# Patient Record
Sex: Female | Born: 1950 | Race: White | Hispanic: No | Smoking: Former smoker
Health system: Southern US, Community
[De-identification: ages and names within clinical notes are randomized; demographics above are authoritative.]

## PROBLEM LIST (undated history)

## (undated) DIAGNOSIS — M1712 Unilateral primary osteoarthritis, left knee: Secondary | ICD-10-CM

## (undated) DIAGNOSIS — K589 Irritable bowel syndrome without diarrhea: Secondary | ICD-10-CM

## (undated) DIAGNOSIS — J069 Acute upper respiratory infection, unspecified: Secondary | ICD-10-CM

## (undated) DIAGNOSIS — J189 Pneumonia, unspecified organism: Secondary | ICD-10-CM

## (undated) DIAGNOSIS — E785 Hyperlipidemia, unspecified: Secondary | ICD-10-CM

## (undated) DIAGNOSIS — K5792 Diverticulitis of intestine, part unspecified, without perforation or abscess without bleeding: Secondary | ICD-10-CM

## (undated) DIAGNOSIS — M810 Age-related osteoporosis without current pathological fracture: Secondary | ICD-10-CM

## (undated) DIAGNOSIS — J40 Bronchitis, not specified as acute or chronic: Secondary | ICD-10-CM

## (undated) DIAGNOSIS — K219 Gastro-esophageal reflux disease without esophagitis: Secondary | ICD-10-CM

## (undated) DIAGNOSIS — F17200 Nicotine dependence, unspecified, uncomplicated: Secondary | ICD-10-CM

## (undated) DIAGNOSIS — R0602 Shortness of breath: Secondary | ICD-10-CM

## (undated) DIAGNOSIS — K449 Diaphragmatic hernia without obstruction or gangrene: Secondary | ICD-10-CM

## (undated) DIAGNOSIS — I209 Angina pectoris, unspecified: Secondary | ICD-10-CM

## (undated) DIAGNOSIS — R059 Cough, unspecified: Secondary | ICD-10-CM

## (undated) DIAGNOSIS — R05 Cough: Secondary | ICD-10-CM

## (undated) DIAGNOSIS — M1990 Unspecified osteoarthritis, unspecified site: Secondary | ICD-10-CM

## (undated) DIAGNOSIS — F32A Depression, unspecified: Secondary | ICD-10-CM

## (undated) DIAGNOSIS — I480 Paroxysmal atrial fibrillation: Secondary | ICD-10-CM

## (undated) DIAGNOSIS — F419 Anxiety disorder, unspecified: Secondary | ICD-10-CM

## (undated) DIAGNOSIS — I10 Essential (primary) hypertension: Secondary | ICD-10-CM

## (undated) DIAGNOSIS — F329 Major depressive disorder, single episode, unspecified: Secondary | ICD-10-CM

## (undated) DIAGNOSIS — T7840XA Allergy, unspecified, initial encounter: Secondary | ICD-10-CM

## (undated) HISTORY — DX: Paroxysmal atrial fibrillation: I48.0

## (undated) HISTORY — DX: Age-related osteoporosis without current pathological fracture: M81.0

## (undated) HISTORY — PX: UPPER GI ENDOSCOPY: SHX6162

## (undated) HISTORY — PX: TONSILLECTOMY: SUR1361

## (undated) HISTORY — DX: Diverticulitis of intestine, part unspecified, without perforation or abscess without bleeding: K57.92

## (undated) HISTORY — DX: Major depressive disorder, single episode, unspecified: F32.9

## (undated) HISTORY — DX: Anxiety disorder, unspecified: F41.9

## (undated) HISTORY — DX: Depression, unspecified: F32.A

## (undated) HISTORY — PX: TUBAL LIGATION: SHX77

## (undated) HISTORY — PX: WISDOM TOOTH EXTRACTION: SHX21

## (undated) HISTORY — PX: CHOLECYSTECTOMY: SHX55

## (undated) HISTORY — PX: KNEE ARTHROSCOPY: SHX127

## (undated) HISTORY — DX: Allergy, unspecified, initial encounter: T78.40XA

## (undated) HISTORY — PX: DILATION AND CURETTAGE OF UTERUS: SHX78

## (undated) HISTORY — DX: Gastro-esophageal reflux disease without esophagitis: K21.9

---

## 2000-12-27 ENCOUNTER — Other Ambulatory Visit: Admission: RE | Admit: 2000-12-27 | Discharge: 2000-12-27 | Payer: Self-pay | Admitting: *Deleted

## 2001-01-21 ENCOUNTER — Encounter (INDEPENDENT_AMBULATORY_CARE_PROVIDER_SITE_OTHER): Payer: Self-pay | Admitting: Specialist

## 2001-01-21 ENCOUNTER — Other Ambulatory Visit: Admission: RE | Admit: 2001-01-21 | Discharge: 2001-01-21 | Payer: Self-pay | Admitting: *Deleted

## 2001-03-30 ENCOUNTER — Inpatient Hospital Stay (HOSPITAL_COMMUNITY): Admission: EM | Admit: 2001-03-30 | Discharge: 2001-03-31 | Payer: Self-pay | Admitting: Emergency Medicine

## 2001-03-30 ENCOUNTER — Encounter: Payer: Self-pay | Admitting: Emergency Medicine

## 2001-04-04 ENCOUNTER — Encounter: Admission: RE | Admit: 2001-04-04 | Discharge: 2001-04-04 | Payer: Self-pay | Admitting: Internal Medicine

## 2001-06-27 ENCOUNTER — Encounter: Admission: RE | Admit: 2001-06-27 | Discharge: 2001-06-27 | Payer: Self-pay

## 2001-07-18 ENCOUNTER — Encounter: Admission: RE | Admit: 2001-07-18 | Discharge: 2001-07-18 | Payer: Self-pay | Admitting: Internal Medicine

## 2002-02-16 ENCOUNTER — Other Ambulatory Visit: Admission: RE | Admit: 2002-02-16 | Discharge: 2002-02-16 | Payer: Self-pay | Admitting: Obstetrics and Gynecology

## 2002-04-14 ENCOUNTER — Encounter: Admission: RE | Admit: 2002-04-14 | Discharge: 2002-04-14 | Payer: Self-pay | Admitting: Internal Medicine

## 2002-09-17 ENCOUNTER — Emergency Department (HOSPITAL_COMMUNITY): Admission: EM | Admit: 2002-09-17 | Discharge: 2002-09-17 | Payer: Self-pay | Admitting: Emergency Medicine

## 2002-09-17 ENCOUNTER — Encounter: Payer: Self-pay | Admitting: Emergency Medicine

## 2003-01-11 ENCOUNTER — Other Ambulatory Visit: Admission: RE | Admit: 2003-01-11 | Discharge: 2003-01-11 | Payer: Self-pay | Admitting: Obstetrics and Gynecology

## 2004-02-01 ENCOUNTER — Other Ambulatory Visit: Admission: RE | Admit: 2004-02-01 | Discharge: 2004-02-01 | Payer: Self-pay | Admitting: Obstetrics and Gynecology

## 2005-04-25 ENCOUNTER — Ambulatory Visit: Payer: Self-pay | Admitting: Internal Medicine

## 2006-07-01 ENCOUNTER — Ambulatory Visit (HOSPITAL_BASED_OUTPATIENT_CLINIC_OR_DEPARTMENT_OTHER): Admission: RE | Admit: 2006-07-01 | Discharge: 2006-07-01 | Payer: Self-pay | Admitting: Orthopedic Surgery

## 2007-07-31 ENCOUNTER — Emergency Department (HOSPITAL_COMMUNITY): Admission: EM | Admit: 2007-07-31 | Discharge: 2007-08-01 | Payer: Self-pay | Admitting: Emergency Medicine

## 2010-02-01 ENCOUNTER — Emergency Department (HOSPITAL_COMMUNITY): Admission: EM | Admit: 2010-02-01 | Discharge: 2010-02-02 | Payer: Self-pay | Admitting: Emergency Medicine

## 2010-11-13 LAB — DIFFERENTIAL
Basophils Absolute: 0.2 10*3/uL — ABNORMAL HIGH (ref 0.0–0.1)
Eosinophils Absolute: 0.3 10*3/uL (ref 0.0–0.7)
Eosinophils Relative: 4 % (ref 0–5)
Lymphocytes Relative: 39 % (ref 12–46)

## 2010-11-13 LAB — CBC
HCT: 37.9 % (ref 36.0–46.0)
Hemoglobin: 13.2 g/dL (ref 12.0–15.0)
MCHC: 34.8 g/dL (ref 30.0–36.0)
MCV: 92.1 fL (ref 78.0–100.0)
Platelets: 141 10*3/uL — ABNORMAL LOW (ref 150–400)
RBC: 4.12 MIL/uL (ref 3.87–5.11)
RDW: 13 % (ref 11.5–15.5)
WBC: 7.1 10*3/uL (ref 4.0–10.5)

## 2010-11-13 LAB — BASIC METABOLIC PANEL
BUN: 12 mg/dL (ref 6–23)
CO2: 30 mEq/L (ref 19–32)
Calcium: 8.8 mg/dL (ref 8.4–10.5)
Chloride: 112 mEq/L (ref 96–112)
Creatinine, Ser: 0.91 mg/dL (ref 0.4–1.2)
GFR calc Af Amer: >60 mL/min (ref >60)
GFR calc non Af Amer: >60 mL/min (ref >60)
Glucose, Bld: 133 mg/dL — ABNORMAL HIGH (ref 70–99)
Potassium: 3.5 mEq/L (ref 3.5–5.1)
Sodium: 145 mEq/L (ref 135–145)

## 2010-11-13 LAB — D-DIMER, QUANTITATIVE: D-Dimer, Quant: 0.29 ug/mL-FEU (ref 0.00–0.48)

## 2010-11-13 LAB — POCT CARDIAC MARKERS
CKMB, poc: 1.2 ng/mL (ref 1.0–8.0)
CKMB, poc: <1 ng/mL — ABNORMAL LOW (ref 1.0–8.0)
Troponin i, poc: <0.05 ng/mL (ref 0.00–0.09)

## 2011-01-12 NOTE — Op Note (Signed)
Kristen Wilson, CLAVEL               ACCOUNT NO.:  [id number]   MEDICAL RECORD NO.:  [id number]          PATIENT TYPE:  AMB   LOCATION:  DSC                          FACILITY:  [facility]   PHYSICIAN:  Robert A. Thurston Hole, M.D. DATE OF BIRTH:  05-23-1951   DATE OF PROCEDURE:  07/01/2006  DATE OF DISCHARGE:                               OPERATIVE REPORT   PREOPERATIVE DIAGNOSES:  Left knee medial and lateral meniscal tears  with chondromalacia and synovitis.   POSTOPERATIVE DIAGNOSES:  Left knee medial and lateral meniscal tears  with chondromalacia and synovitis.   PROCEDURES:  1. Left knee examination under anesthesia, followed by arthroscopic      partial medial and lateral meniscectomies.  2. Left knee chondroplasty with partial synovectomy.   SURGEON:  Elana Alm. Thurston Hole, M.D.   ASSISTANT:  Julien Girt, P.A.   ANESTHESIA:  General.   OPERATIVE TIME:  30 minutes.   COMPLICATIONS:  None.   INDICATIONS FOR PROCEDURES:  Miss Weldon is a 60 year old woman, who has  had significant left knee pain for the past 6 to 8 months, increasing in  age, with exam and MRI documenting meniscal tearing with chondromalacia  and synovitis.  She has failed conservative care and is now to undergo  arthroscopy.   DESCRIPTION OF PROCEDURES:  Miss Helwig was brought to the operating room  on 07/01/06, after a knee block was placed in the holding area by  Anesthesia.  She was placed on the operating table in supine position.  Her left knee was examined under anesthesia.  Range of motion: 0 to 120  degrees, 1 to 2+ crepitation.  Knee stable to ligamentous exam, with  normal patellar tracking.  The left leg was then prepped using sterile  DuraPrep and draped using sterile technique.  Originally, through an  anterolateral portal, the arthroscope with a pump attached was placed,  and through an anteromedial portal an arthroscopic probe was placed.  On  initial inspection of the medial compartment, the  articular cartilage  showed 50 to 60% grade 3 chondromalacia, which was debrided.  Medial  meniscus, posteromedial horn, of which 50% was resected back to a stable  rim.  The intercondylar notch was inspected.  The anterior and posterior  cruciate ligaments were normal.  The lateral compartment was inspected.  The articular cartilage showed 30 to 40% grade 3 chondromalacia, which  was debrided.  Lateral meniscal tear, 25% posterolateral corner, was  resected back to a stable rim.  The patellofemoral joint showed 50%  grade 3 changes on the patella, which was debrided.  Femoral groove  articular cartilage was intact.  The patella tracked normally.  Moderate  synovitis in the medial and lateral gutters was debrided.  Otherwise,  they were free of pathology.  After this was done, it was felt that all  pathology had been satisfactorily addressed.  The instruments were  removed.  The portals were closed with 3-0 nylon suture and injected  with 0.25% Marcaine with epinephrine and 4 mg of morphine.  Sterile  dressings were applied and the patient awakened and taken to the  recovery room in stable condition.   FOLLOWUP CARE:  Miss Mathia will be followed as an outpatient on Vicodin  and Naprosyn.  See me back in the office for sutures out and followup.      Robert A. Thurston Hole, M.D.  Electronically Signed     RAW/MEDQ  D:  07/01/2006  T:  07/01/2006  Job:  [id number]

## 2011-02-14 ENCOUNTER — Emergency Department (HOSPITAL_COMMUNITY)
Admission: EM | Admit: 2011-02-14 | Discharge: 2011-02-15 | Disposition: A | Discharge status: Home / Self Care | Payer: Federal, State, Local not specified - PPO | Attending: Emergency Medicine | Admitting: Emergency Medicine

## 2011-02-14 ENCOUNTER — Emergency Department (HOSPITAL_COMMUNITY): Payer: Federal, State, Local not specified - PPO

## 2011-02-14 DIAGNOSIS — Z7982 Long term (current) use of aspirin: Secondary | ICD-10-CM | POA: Insufficient documentation

## 2011-02-14 DIAGNOSIS — R05 Cough: Secondary | ICD-10-CM | POA: Insufficient documentation

## 2011-02-14 DIAGNOSIS — F17200 Nicotine dependence, unspecified, uncomplicated: Secondary | ICD-10-CM | POA: Insufficient documentation

## 2011-02-14 DIAGNOSIS — R059 Cough, unspecified: Secondary | ICD-10-CM | POA: Insufficient documentation

## 2011-02-14 DIAGNOSIS — K644 Residual hemorrhoidal skin tags: Secondary | ICD-10-CM | POA: Insufficient documentation

## 2011-02-14 DIAGNOSIS — M549 Dorsalgia, unspecified: Secondary | ICD-10-CM | POA: Insufficient documentation

## 2011-02-14 DIAGNOSIS — G8929 Other chronic pain: Secondary | ICD-10-CM | POA: Insufficient documentation

## 2011-02-14 DIAGNOSIS — R079 Chest pain, unspecified: Secondary | ICD-10-CM | POA: Insufficient documentation

## 2011-02-14 DIAGNOSIS — K219 Gastro-esophageal reflux disease without esophagitis: Secondary | ICD-10-CM | POA: Insufficient documentation

## 2011-02-14 LAB — CK TOTAL AND CKMB (NOT AT ARMC)
CK, MB: 4.5 ng/mL — ABNORMAL HIGH (ref 0.3–4.0)
CK, MB: 4 ng/mL (ref 0.3–4.0)
Relative Index: 4 — ABNORMAL HIGH (ref 0.0–2.5)
Total CK: 118 U/L (ref 7–177)

## 2011-02-14 LAB — OCCULT BLOOD, POC DEVICE: Fecal Occult Bld: NEGATIVE

## 2011-02-14 LAB — CBC
MCH: 30.4 pg (ref 26.0–34.0)
MCHC: 34.4 g/dL (ref 30.0–36.0)
Platelets: 182 10*3/uL (ref 150–400)

## 2011-02-14 LAB — COMPREHENSIVE METABOLIC PANEL
ALT: 29 U/L (ref 0–35)
AST: 21 U/L (ref 0–37)
Calcium: 8.9 mg/dL (ref 8.4–10.5)
Creatinine, Ser: 0.64 mg/dL (ref 0.50–1.10)
GFR calc Af Amer: >60 mL/min (ref >60)
Sodium: 141 mEq/L (ref 135–145)
Total Protein: 6.3 g/dL (ref 6.0–8.3)

## 2011-02-14 LAB — DIFFERENTIAL
Basophils Relative: 0 % (ref 0–1)
Eosinophils Absolute: 0.5 10*3/uL (ref 0.0–0.7)
Eosinophils Relative: 7 % — ABNORMAL HIGH (ref 0–5)
Monocytes Absolute: 0.4 10*3/uL (ref 0.1–1.0)
Monocytes Relative: 6 % (ref 3–12)

## 2011-02-14 LAB — TROPONIN I: Troponin I: <0.3 ng/mL (ref <0.30)

## 2011-06-04 LAB — POCT CARDIAC MARKERS
CKMB, poc: 1
Myoglobin, poc: 59.1
Operator id: 284251
Troponin i, poc: <0.05

## 2011-06-04 LAB — DIFFERENTIAL
Basophils Absolute: 0
Basophils Relative: 1
Lymphocytes Relative: 37
Monocytes Absolute: 0.5
Neutro Abs: 4
Neutrophils Relative %: 51

## 2011-06-04 LAB — CBC
Hemoglobin: 13.9
RBC: 4.53
RDW: 12.6

## 2011-06-04 LAB — I-STAT 8, (EC8 V) (CONVERTED LAB)
Acid-Base Excess: 1
Chloride: 108
HCT: 43
Hemoglobin: 14.6
Potassium: 3.5
Sodium: 142
TCO2: 26
pH, Ven: 7.435 — ABNORMAL HIGH

## 2011-06-04 LAB — POCT I-STAT CREATININE: Operator id: 270651

## 2011-11-01 ENCOUNTER — Other Ambulatory Visit: Payer: Self-pay

## 2011-11-01 ENCOUNTER — Emergency Department (HOSPITAL_COMMUNITY): Payer: Federal, State, Local not specified - PPO

## 2011-11-01 ENCOUNTER — Encounter (HOSPITAL_COMMUNITY): Payer: Self-pay | Admitting: Emergency Medicine

## 2011-11-01 ENCOUNTER — Observation Stay (HOSPITAL_COMMUNITY)
Admission: EM | Admit: 2011-11-01 | Discharge: 2011-11-03 | Disposition: A | Discharge status: Home / Self Care | Payer: Federal, State, Local not specified - PPO | Attending: Internal Medicine | Admitting: Internal Medicine

## 2011-11-01 DIAGNOSIS — R911 Solitary pulmonary nodule: Secondary | ICD-10-CM | POA: Insufficient documentation

## 2011-11-01 DIAGNOSIS — R079 Chest pain, unspecified: Principal | ICD-10-CM | POA: Insufficient documentation

## 2011-11-01 DIAGNOSIS — I160 Hypertensive urgency: Secondary | ICD-10-CM

## 2011-11-01 DIAGNOSIS — K219 Gastro-esophageal reflux disease without esophagitis: Secondary | ICD-10-CM | POA: Insufficient documentation

## 2011-11-01 DIAGNOSIS — F411 Generalized anxiety disorder: Secondary | ICD-10-CM | POA: Insufficient documentation

## 2011-11-01 DIAGNOSIS — I10 Essential (primary) hypertension: Secondary | ICD-10-CM | POA: Insufficient documentation

## 2011-11-01 DIAGNOSIS — K449 Diaphragmatic hernia without obstruction or gangrene: Secondary | ICD-10-CM | POA: Insufficient documentation

## 2011-11-01 DIAGNOSIS — E785 Hyperlipidemia, unspecified: Secondary | ICD-10-CM | POA: Diagnosis present

## 2011-11-01 HISTORY — DX: Unspecified osteoarthritis, unspecified site: M19.90

## 2011-11-01 HISTORY — DX: Diaphragmatic hernia without obstruction or gangrene: K44.9

## 2011-11-01 HISTORY — DX: Hyperlipidemia, unspecified: E78.5

## 2011-11-01 HISTORY — DX: Essential (primary) hypertension: I10

## 2011-11-01 LAB — BASIC METABOLIC PANEL
BUN: 16 mg/dL (ref 6–23)
Creatinine, Ser: 0.79 mg/dL (ref 0.50–1.10)
GFR calc Af Amer: >90 mL/min (ref >90)
GFR calc non Af Amer: 88 mL/min — ABNORMAL LOW (ref >90)
Potassium: 3.7 mEq/L (ref 3.5–5.1)

## 2011-11-01 LAB — CBC
Hemoglobin: 15 g/dL (ref 12.0–15.0)
MCH: 31.4 pg (ref 26.0–34.0)
MCHC: 34.9 g/dL (ref 30.0–36.0)
RDW: 12.2 % (ref 11.5–15.5)

## 2011-11-01 LAB — DIFFERENTIAL
Basophils Relative: 0 % (ref 0–1)
Eosinophils Absolute: 0.4 10*3/uL (ref 0.0–0.7)
Monocytes Absolute: 0.5 10*3/uL (ref 0.1–1.0)
Monocytes Relative: 6 % (ref 3–12)
Neutrophils Relative %: 51 % (ref 43–77)

## 2011-11-01 MED ORDER — GI COCKTAIL ~~LOC~~
30.0000 mL | Freq: Once | ORAL | Status: AC
  Administered 2011-11-01: 30 mL via ORAL
  Filled 2011-11-01: qty 30

## 2011-11-01 MED ORDER — NITROGLYCERIN 0.4 MG SL SUBL
0.4000 mg | SUBLINGUAL_TABLET | SUBLINGUAL | Status: DC | PRN
Start: 2011-11-01 — End: 2011-11-02
  Administered 2011-11-01: 0.4 mg via SUBLINGUAL
  Filled 2011-11-01: qty 25

## 2011-11-01 MED ORDER — ASPIRIN 81 MG PO CHEW
324.0000 mg | CHEWABLE_TABLET | Freq: Once | ORAL | Status: AC
  Administered 2011-11-01: 324 mg via ORAL
  Filled 2011-11-01: qty 4

## 2011-11-01 MED ORDER — MORPHINE SULFATE 4 MG/ML IJ SOLN: 4.0000 mg | Freq: Once | INTRAMUSCULAR | Status: DC

## 2011-11-01 MED ORDER — PANTOPRAZOLE SODIUM 40 MG IV SOLR
40.0000 mg | Freq: Once | INTRAVENOUS | Status: AC
  Administered 2011-11-01: 40 mg via INTRAVENOUS
  Filled 2011-11-01: qty 40

## 2011-11-01 MED ORDER — MORPHINE SULFATE 2 MG/ML IJ SOLN
2.0000 mg | Freq: Once | INTRAMUSCULAR | Status: AC
  Administered 2011-11-01: 2 mg via INTRAVENOUS
  Filled 2011-11-01: qty 1

## 2011-11-01 NOTE — ED Notes (Signed)
Recent HTN/Lisinopril diagnosis. BP 203/115 at home-became anxious and called.

## 2011-11-01 NOTE — ED Notes (Signed)
Labs pulled with IV insertion.

## 2011-11-01 NOTE — ED Notes (Signed)
On 24 hour monitor for last 1.5 weeks for chest pain.

## 2011-11-02 ENCOUNTER — Encounter (HOSPITAL_COMMUNITY): Payer: Self-pay | Admitting: Internal Medicine

## 2011-11-02 ENCOUNTER — Observation Stay (HOSPITAL_COMMUNITY): Payer: Federal, State, Local not specified - PPO

## 2011-11-02 DIAGNOSIS — I10 Essential (primary) hypertension: Secondary | ICD-10-CM | POA: Diagnosis present

## 2011-11-02 DIAGNOSIS — F4389 Other reactions to severe stress: Secondary | ICD-10-CM

## 2011-11-02 DIAGNOSIS — F438 Other reactions to severe stress: Secondary | ICD-10-CM

## 2011-11-02 DIAGNOSIS — F411 Generalized anxiety disorder: Secondary | ICD-10-CM

## 2011-11-02 DIAGNOSIS — R079 Chest pain, unspecified: Principal | ICD-10-CM

## 2011-11-02 LAB — CARDIAC PANEL(CRET KIN+CKTOT+MB+TROPI)
CK, MB: 2.4 ng/mL (ref 0.3–4.0)
Relative Index: INVALID (ref 0.0–2.5)
Relative Index: INVALID (ref 0.0–2.5)
Total CK: 68 U/L (ref 7–177)
Troponin I: <0.3 ng/mL (ref <0.30)

## 2011-11-02 MED ORDER — SODIUM CHLORIDE 0.9 % IJ SOLN
3.0000 mL | Freq: Two times a day (BID) | INTRAMUSCULAR | Status: DC
  Administered 2011-11-02 (×2): 3 mL via INTRAVENOUS

## 2011-11-02 MED ORDER — ASPIRIN EC 325 MG PO TBEC
325.0000 mg | DELAYED_RELEASE_TABLET | Freq: Every day | ORAL | Status: DC
  Administered 2011-11-02 – 2011-11-03 (×2): 325 mg via ORAL
  Filled 2011-11-02 (×2): qty 1

## 2011-11-02 MED ORDER — ACETAMINOPHEN 325 MG PO TABS
650.0000 mg | ORAL_TABLET | Freq: Four times a day (QID) | ORAL | Status: DC | PRN
  Administered 2011-11-02: 650 mg via ORAL
  Filled 2011-11-02: qty 2

## 2011-11-02 MED ORDER — ONDANSETRON HCL 4 MG PO TABS: 4.0000 mg | ORAL_TABLET | Freq: Four times a day (QID) | ORAL | Status: DC | PRN

## 2011-11-02 MED ORDER — ONDANSETRON HCL 4 MG/2ML IJ SOLN: 4.0000 mg | Freq: Four times a day (QID) | INTRAMUSCULAR | Status: DC | PRN

## 2011-11-02 MED ORDER — IOHEXOL 350 MG/ML SOLN
80.0000 mL | Freq: Once | INTRAVENOUS | Status: AC | PRN
  Administered 2011-11-02: 80 mL via INTRAVENOUS

## 2011-11-02 MED ORDER — LORAZEPAM 0.5 MG PO TABS
0.5000 mg | ORAL_TABLET | Freq: Two times a day (BID) | ORAL | Status: DC | PRN
  Administered 2011-11-02 – 2011-11-03 (×3): 0.5 mg via ORAL
  Filled 2011-11-02 (×3): qty 1

## 2011-11-02 MED ORDER — HYDROXYZINE HCL 25 MG PO TABS
25.0000 mg | ORAL_TABLET | Freq: Three times a day (TID) | ORAL | Status: DC | PRN
  Administered 2011-11-02: 25 mg via ORAL
  Filled 2011-11-02 (×2): qty 1

## 2011-11-02 MED ORDER — LISINOPRIL 10 MG PO TABS
10.0000 mg | ORAL_TABLET | Freq: Every day | ORAL | Status: DC
  Administered 2011-11-02 – 2011-11-03 (×2): 10 mg via ORAL
  Filled 2011-11-02 (×2): qty 1

## 2011-11-02 MED ORDER — ATORVASTATIN CALCIUM 10 MG PO TABS
10.0000 mg | ORAL_TABLET | Freq: Every day | ORAL | Status: DC
  Administered 2011-11-02: 10 mg via ORAL
  Filled 2011-11-02 (×2): qty 1

## 2011-11-02 MED ORDER — METOPROLOL TARTRATE 25 MG PO TABS: 25.0000 mg | ORAL_TABLET | Freq: Two times a day (BID) | ORAL | Status: DC

## 2011-11-02 MED ORDER — TRAZODONE HCL 50 MG PO TABS
50.0000 mg | ORAL_TABLET | Freq: Every day | ORAL | Status: DC
  Administered 2011-11-02: 50 mg via ORAL
  Filled 2011-11-02 (×2): qty 1

## 2011-11-02 MED ORDER — PANTOPRAZOLE SODIUM 40 MG PO TBEC
40.0000 mg | DELAYED_RELEASE_TABLET | Freq: Every day | ORAL | Status: DC
  Administered 2011-11-02: 40 mg via ORAL
  Filled 2011-11-02: qty 1

## 2011-11-02 MED ORDER — METOPROLOL TARTRATE 12.5 MG HALF TABLET
12.5000 mg | ORAL_TABLET | Freq: Two times a day (BID) | ORAL | Status: DC
  Administered 2011-11-02 – 2011-11-03 (×2): 12.5 mg via ORAL
  Filled 2011-11-02 (×3): qty 1

## 2011-11-02 MED ORDER — ACETAMINOPHEN 650 MG RE SUPP: 650.0000 mg | Freq: Four times a day (QID) | RECTAL | Status: DC | PRN

## 2011-11-02 MED ORDER — NICOTINE 21 MG/24HR TD PT24
21.0000 mg | MEDICATED_PATCH | Freq: Every day | TRANSDERMAL | Status: DC
  Administered 2011-11-02 – 2011-11-03 (×2): 21 mg via TRANSDERMAL
  Filled 2011-11-02 (×2): qty 1

## 2011-11-02 NOTE — Consult Note (Signed)
CARDIOLOGY CONSULT NOTE  Patient ID: Kristen Wilson MRN: [id number] DOB/AGE: 05-09-1951 61 y.o.  Admit date: 11/01/2011 Referring Physician: Dr Isidoro Donning Primary Physician: Dr Uvaldo Rising Primary Cardiologist: Dr Konrad Felix Reason for Consultation: Chest Pain  HPI: This is a 61 year old woman who presented with severe hypertension and chest pain. She reports a great deal of anxiety over recent weeks. She's had difficulty with blood pressure control. She actually has been working closely with her cardiologist in [facility], Dr Konrad Felix. She has undergone recent stress testing with a nuclear scan, an echo, and carotid Dopplers. She tells me all of these were within normal limits. She is currently wearing an event recorder and apparently there were some supraventricular arrhythmias.  She was just started on lisinopril for treatment of her hypertension.  She measured her blood pressure at home and it was greater than 200/100 so she came to the emergency room out of here of having a stroke. She also reports chest pain. She has undergone serial cardiac markers and these have all been normal.  Chest pain has been reproducible with palpation but there is also than a pressure-like component. She denies associated shortness of breath, diaphoresis, nausea, or vomiting.  Past Medical History  Diagnosis Date  . Hypertension   . Hyperlipidemia   . Hiatal hernia   . Arthritis     "Whole body"     Past Surgical History  Procedure Date  . Cholecystectomy   . Tubal ligation   . Knee arthroscopy     left     No family history on file.  Social History: History   Social History  . Marital Status: Widowed    Spouse Name: N/A    Number of Children: N/A  . Years of Education: N/A   Occupational History  . Not on file.   Social History Main Topics  . Smoking status: Current Everyday Smoker -- 1.5 packs/day for 25 years  . Smokeless tobacco: Not on file  . Alcohol Use: No  . Drug Use: No  . Sexually Active:  Not Currently   Other Topics Concern  . Not on file   Social History Narrative  . No narrative on file     Prescriptions prior to admission  Medication Sig Dispense Refill  . estradiol-norethindrone (ACTIVELLA) 1-0.5 MG per tablet Take 0.5 tablets by mouth daily.      . fish oil-omega-3 fatty acids 1000 MG capsule Take 2 g by mouth daily.      Marland Kitchen lisinopril (PRINIVIL,ZESTRIL) 10 MG tablet Take 10 mg by mouth daily.      . Multiple Vitamin (MULTIVITAMIN) tablet Take 1 tablet by mouth daily.      . pantoprazole (PROTONIX) 40 MG tablet Take 40 mg by mouth daily.      . rosuvastatin (CRESTOR) 5 MG tablet Take 5 mg by mouth daily.       ROS: General: no fevers/chills/night sweats Eyes: no blurry vision, diplopia, or amaurosis ENT: no sore throat or hearing loss Resp: no cough, wheezing, or hemoptysis CV: no edema or palpitations GI: no abdominal pain, nausea, vomiting, diarrhea, or constipation GU: no dysuria, frequency, or hematuria Skin: no rash Neuro: no headache, numbness, tingling, or weakness of extremities Musculoskeletal: no joint pain or swelling Heme: no bleeding, DVT, or easy bruising Endo: no polydipsia or polyuria   Physical Exam: Blood pressure 132/83, pulse 62, temperature 98.2 F (36.8 C), temperature source Oral, resp. rate 20, height 5\' 2"  (1.575 m), weight 75.8 kg (167 lb  1.7 oz), SpO2 97.00%.  Pt is alert and oriented, overweight anxious woman, in no distress. HEENT: normal Neck: JVP normal. Carotid upstrokes normal without bruits. No thyromegaly. Lungs: equal expansion, clear bilaterally CV: Apex is discrete and nondisplaced, RRR without murmur or gallop Abd: soft, NT, +BS, no bruit, no hepatosplenomegaly Back: no CVA tenderness Ext: no C/C/E        Femoral pulses 2+=         DP/PT pulses intact and = Skin: warm and dry without rash Neuro: CNII-XII intact             Strength intact = bilaterally  Labs:   Lab Results  Component Value Date   WBC 8.1  11/01/2011   HGB 15.0 11/01/2011   HCT 43.0 11/01/2011   MCV 90.1 11/01/2011   PLT 167 11/01/2011    Lab 11/01/11 2202  NA 144  K 3.7  CL 107  CO2 28  BUN 16  CREATININE 0.79  CALCIUM 10.0  PROT --  BILITOT --  ALKPHOS --  ALT --  AST --  GLUCOSE 92   Lab Results  Component Value Date   CKTOTAL 68 11/02/2011   CKMB 2.4 11/02/2011   TROPONINI <0.30 11/02/2011    No results found for this basename: CHOL   No results found for this basename: HDL   No results found for this basename: LDLCALC   No results found for this basename: TRIG   No results found for this basename: CHOLHDL   No results found for this basename: LDLDIRECT      Radiology: Dg Chest 2 View  11/01/2011  *RADIOLOGY REPORT*  Clinical Data: Pain.  Hypertension.  CHEST - 2 VIEW  Comparison: 09/12 and 02/14/2011  Findings: Heart size and vascularity are normal and the lungs are clear.  No significant osseous abnormality.  IMPRESSION: No acute disease.  Original Report Authenticated By: Gwynn Burly, M.D.   Ct Angio Chest W/cm &/or Wo Cm  11/02/2011  *RADIOLOGY REPORT*  Clinical Data: Left-sided chest pain.  Evaluate for pulmonary embolism or other acute abnormality.  CT ANGIOGRAPHY CHEST  Technique:  Multidetector CT imaging of the chest using the standard protocol during bolus administration of intravenous contrast. Multiplanar reconstructed images including MIPs were obtained and reviewed to evaluate the vascular anatomy.  Contrast: 80mL OMNIPAQUE IOHEXOL 350 MG/ML IV SOLN  Comparison: No priors.  Findings:  Mediastinum: Heart size is upper limits of normal. There is no significant pericardial fluid, thickening or pericardial calcification.  There are no filling defects within the pulmonary arterial tree to suggest underlying pulmonary embolism. No pathologically enlarged mediastinal or hilar lymph nodes. Esophagus is unremarkable in appearance.  No gross acute abnormality of the thoracic aorta (aorta is not completely  opacified with IV contrast).  Lungs/Pleura: 6 mm ground-glass attenuation nodule in the apex of the right upper lobe (image 16 of series 5). There are no other larger more suspicious appearing pulmonary nodules or masses identified.  Background of mild diffuse bronchial wall thickening and very mild centrilobular emphysema (most notable in the apices). No consolidative airspace disease.  No pleural effusions.  Upper Abdomen: Status post cholecystectomy.  Musculoskeletal: There are no aggressive appearing lytic or blastic lesions noted in the visualized portions of the skeleton.  IMPRESSION: 1.  No evidence of pulmonary embolism. 2.  No definite acute findings within the thorax to account for the patient's symptoms. 3.  6 mm ground-glass attenuation nodule in the apex of the right upper lobe. Initial  follow-up by chest CT without contrast is recommended in 3 months to confirm persistence.   This recommendation follows the consensus statement: Recommendations for the Management of Subsolid Pulmonary Nodules Detected at CT:  A Statement from the Fleischner Society as published in Radiology 2013; 266:304-317. 4.  Changes compatible with mild COPD, as above. 4.  Status post cholecystectomy.  Original Report Authenticated By: Florencia Reasons, M.D.    EKG: Sinus rhythm 61 beats per minute, within normal limits.  ASSESSMENT AND PLAN:  1. Atypical chest pain with negative cardiac markers 2. Malignant hypertension 3. Possible supraventricular tachycardia  The patient has atypical chest pain with a recent normal stress test. She seems to have extreme anxiety and I suspect much of her current problems with blood pressure and chest pain are related to this.  The patient's blood pressure has been better controlled here in the hospital. I think it would be reasonable to start her on a low-dose beta blocker such as metoprolol 25 mg twice daily. This would provide better control of her blood pressure and I suspect she  will continue to have labile blood pressure readings depending on her stress level. I recommended that she stop checking her blood pressure so frequently at home. With respect to her ongoing cardiac evaluation, I feel she is stable for discharge and she can continue followup with her primary cardiologist. I don't see any reason to repeat testing that has recently been done. This patient's overall clinical picture is one of low risk, and I think she can be discharged home with further outpatient workup. Please feel free to call back any time if further cardiac problems arise.  Thank you.  Tonny Bollman 11/02/2011, 5:15 PM

## 2011-11-02 NOTE — H&P (Signed)
PCP:  MCNEILL,WENDY, MD, MD  Confirmed with pt Dr. Konrad Felix cardiology  Chief Complaint:  Elevated BP and chest pain  HPI: 60yoF with h/o HTN, HL, hiatal hernia with severe reflux, current smoker, presents with severe HTN  and chest pain.   Pt is able to relate her history and states she in the recent past, she has been getting evaluated  for chest pain and was referred to Dr. Konrad Felix in cardiology, who did a nuclear stress test and  echo which she states were completely normal, and this was just within the past few weeks. Due to  some sensations of fluttering, she has also been on a Holter monitor for the past 1.5 weeks.  She also states that her BP in the past 1-2 years has gotten much worse, but previously through  her life was always 130/80's. She was started on Lisinopril in the past couple days. She has been  measuring her BP's and has a log which shows BP's 140-180's / 90-110's, with many values that are  quite high.   Tonight, she measured her BP and noted it to be >200 / 100 at which point she started getting very  anxious, agitated, and even started to cry. She called her neighbor an RN afraid she was going to  have a stroke. She then developed left sided chest pain, with no radiation and no SOB, dizziness,  lightheadedness, nausea, vomiting, SOB, or any other associated symptom. Of note, she was not  having chest pain until she started getting very agitated.   In the ED vitals were stable. Labs including chem, CBC, Trop, INR were all unremarkable. CXR was  negative. Admission was requested for a rule out MI. Her pain was possibly relieved with NTG.   Of note, pt was last seen in ED 01/2011 with chest pain and per notes "got worse when she spoke  with church members and got scared" and relieved with SL NTG then too. Notes indicate chest pain  was reproducible but hurting in various spots when examined. Trop negative x2 and discharged.   She also endorses a h/o severe reflux  with a known hiatal hernia, and in fact in the ED states she  was having bad acid pain and requesting food and drink to relieve it. She has been actively  smoking daily, has h/o HTN/HL, but denies DM or family Hx of cardiac disease. She is under a lot  of stress recently. Otherwise, her ROS is negative.     Past Medical History  Diagnosis Date  . Hypertension   . Hyperlipidemia   . Hiatal hernia   . Arthritis     "Whole body"    Past Surgical History  Procedure Date  . Cholecystectomy   . Tubal ligation   . Knee arthroscopy     left    Medications:  HOME MEDS: Reconciled with pt  Prior to Admission medications   Medication Sig Start Date End Date Taking? Authorizing Provider  estradiol-norethindrone (ACTIVELLA) 1-0.5 MG per tablet Take 0.5 tablets by mouth daily.   Yes Historical Provider, MD  fish oil-omega-3 fatty acids 1000 MG capsule Take 2 g by mouth daily.   Yes Historical Provider, MD  lisinopril (PRINIVIL,ZESTRIL) 10 MG tablet Take 10 mg by mouth daily.   Yes Historical Provider, MD  Multiple Vitamin (MULTIVITAMIN) tablet Take 1 tablet by mouth daily.   Yes Historical Provider, MD  pantoprazole (PROTONIX) 40 MG tablet Take 40 mg by mouth daily.   Yes Historical  Provider, MD  rosuvastatin (CRESTOR) 5 MG tablet Take 5 mg by mouth daily.   Yes Historical Provider, MD    Allergies:  Allergies  Allergen Reactions  . Latex Hives    Social History:   reports that she has been smoking.  She does not have any smokeless tobacco history on file. She reports that she does not drink alcohol or use illicit drugs.  Still actively smoking, and under a lot of stress. Still active and works daily    Family History: No family history on file.  Physical Exam: Filed Vitals:   11/02/11 0000 11/02/11 0030 11/02/11 0100 11/02/11 0130  BP: 142/71 150/65 138/82 123/79  Pulse: 60 62 64 68  Temp:      TempSrc:      Resp: 16 14 14 16   SpO2: 99% 99% 97% 97%   Blood pressure  123/79, pulse 68, temperature 98.4 F (36.9 C), temperature source Oral, resp. rate 16, SpO2 97.00%.  Gen: Middle aged appearing F laying calmly in ED stretcher, appears well, no distress, able to  relate history well without increased WOB, accessory muscle use. Pleasant lady, good historian HEENT: Pupils round, reactive normal appearing, EOMI, sclera clear. Mouth moist, normal Lungs: CTAB no w/c/r, good air movement, overall normal exam througout Heart: Regular, not tachycardic, no m/g, very benign auscultation. However, she jumps quite  noticeably when her left chest is palpated and endorses pain and says "that's sore."  Abd: Overweight but not grossly obese, soft non tender, non distended, normal exam Extem: Warm, perfusing well, good bulk and tone, normal exam. Radials palpable. No BLE edema noted Neuro: Alert, attentive, conversant, CN 2-12 intact, moves extremities well, grossly non focal   Labs & Imaging Results for orders placed during the hospital encounter of 11/01/11 (from the past 48 hour(s))  CBC     Status: Normal   Collection Time   11/01/11 10:02 PM      Component Value Range Comment   WBC 8.1  4.0 - 10.5 (K/uL)    RBC 4.77  3.87 - 5.11 (MIL/uL)    Hemoglobin 15.0  12.0 - 15.0 (g/dL)    HCT 08.6  57.8 - 46.9 (%)    MCV 90.1  78.0 - 100.0 (fL)    MCH 31.4  26.0 - 34.0 (pg)    MCHC 34.9  30.0 - 36.0 (g/dL)    RDW 62.9  52.8 - 41.3 (%)    Platelets 167  150 - 400 (K/uL)   DIFFERENTIAL     Status: Normal   Collection Time   11/01/11 10:02 PM      Component Value Range Comment   Neutrophils Relative 51  43 - 77 (%)    Neutro Abs 4.1  1.7 - 7.7 (K/uL)    Lymphocytes Relative 38  12 - 46 (%)    Lymphs Abs 3.1  0.7 - 4.0 (K/uL)    Monocytes Relative 6  3 - 12 (%)    Monocytes Absolute 0.5  0.1 - 1.0 (K/uL)    Eosinophils Relative 5  0 - 5 (%)    Eosinophils Absolute 0.4  0.0 - 0.7 (K/uL)    Basophils Relative 0  0 - 1 (%)    Basophils Absolute 0.0  0.0 - 0.1 (K/uL)     BASIC METABOLIC PANEL     Status: Abnormal   Collection Time   11/01/11 10:02 PM      Component Value Range Comment   Sodium 144  135 -  145 (mEq/L)    Potassium 3.7  3.5 - 5.1 (mEq/L)    Chloride 107  96 - 112 (mEq/L)    CO2 28  19 - 32 (mEq/L)    Glucose, Bld 92  70 - 99 (mg/dL)    BUN 16  6 - 23 (mg/dL)    Creatinine, Ser 1.61  0.50 - 1.10 (mg/dL)    Calcium 09.6  8.4 - 10.5 (mg/dL)    GFR calc non Af Amer 88 (*) >90 (mL/min)    GFR calc Af Amer >90  >90 (mL/min)   PROTIME-INR     Status: Normal   Collection Time   11/01/11 10:02 PM      Component Value Range Comment   Prothrombin Time 13.8  11.6 - 15.2 (seconds)    INR 1.04  0.00 - 1.49    APTT     Status: Normal   Collection Time   11/01/11 10:02 PM      Component Value Range Comment   aPTT 31  24 - 37 (seconds)   TROPONIN I     Status: Normal   Collection Time   11/01/11 10:03 PM      Component Value Range Comment   Troponin I <0.30  <0.30 (ng/mL)    Dg Chest 2 View  11/01/2011  *RADIOLOGY REPORT*  Clinical Data: Pain.  Hypertension.  CHEST - 2 VIEW  Comparison: 09/12 and 02/14/2011  Findings: Heart size and vascularity are normal and the lungs are clear.  No significant osseous abnormality.  IMPRESSION: No acute disease.  Original Report Authenticated By: Gwynn Burly, M.D.    ECG: NSR 61 bpm, normal axis, normal P and PR, narrow QRS, no ST deviations, all normal T waves,  this is a completely normal ECG.   Impression Present on Admission:  .Hypertension .Chest pain .Hyperlipidemia  60yoF with h/o HTN, HL, hiatal hernia with severe reflux, current smoker, presents with severe HTN  and chest pain.   1. Chest pain: She has completely normal ECG and Trop negative. She had negative nuclear stress  test and echo, per her report, just a week ago. Overall, this does not sound like ischemic heart  disease in the least.   Arrhythmia is a possibility, for which she has been on Holter monitor for the past couple weeks  per  outpt cardiologist Dr. Konrad Felix, however we have not seen any evidence of arrhythmia here.   Most likely given the clinical history and exam, is either MSK/chostochondritis (she jumps up when  her chest is palpated) vs anxiety (she herself endorses this, and chest pain didn't start tonight  until she saw elevated BP, at which point she started crying and getting agitated) vs hiatal  hernia (she endorses very severe reflux).   However, given her quite elevated BP's, hypertensive urgency with chest pain is not unreasonable.   - Admit observation and trend enzymes, ECG. Admit telemetry. Monitor BP's. Will give ASA 325 for  now, but no further ACS treatment, stop if rules out. If her cardiac enzymes are negative would  discharge to f/u with Dr. Konrad Felix  2. HTN: Given her report of lifetime normal BP and increased in past 1-2 years, I advised her to  talk to outpt providers for secondary HTN workup. For now will admit, continue home meds, and  monitor, consider increasing Lisinopril. Also recommended she stop taking her oral hormonal pills  as these can raise BP.   Telemetry, [facility] team 7 Presumed full code  Other plans as per orders.  Nicholaos Schippers 11/02/2011, 3:49 AM

## 2011-11-02 NOTE — ED Notes (Signed)
Bed changed to telemetry after Dr. Kaylyn Layer assessed patient.  Bed flow notified.

## 2011-11-02 NOTE — Progress Notes (Signed)
Patient seen and examined, admitted by Dr. Kaylyn Layer today morning. Briefly 61 year old female with history of hypertension, hyperlipidemia, hiatal hernia, active tobacco use admitted with hypertensive urgency and atypical chest pain. Patient is currently undergoing cardiac workup with her outpatient cardiologist, Dr. Michaelle Birks in [facility]. She states that she's been having the chest pain for one week, had 2-D echo, nuclear stress test and carotid Dopplers with a Holter monitor done by her cardiologist, so far she has not heard anything abnormal. - 2 sets of cardiac enzymes are negative, EKG unremarkable, patient has significant anxiety complement with musculoskeletal component as well to chest pain. She requested Dr. [place] (who had previously treated her father), hence [facility] cardiology consult called. Will obtain results off all her cardiac workup from her outpatient cardiologist. - Given some pulmonary component to the chest pain I have ordered CT angio chest. I have consulted psychiatry given her anxiety state and depression, no suicidal ideation. - Placed on Nicoderm patch, continue PPI. - Will follow closely, if stable and no acute issues overnight can be discharged home in a.m.   Dorismar Chay M.D. [facility] 11/02/2011, 11:32 AM  Pager: [phone]

## 2011-11-02 NOTE — Consult Note (Signed)
Patient Identification:  Kristen Wilson Date of Evaluation:  11/02/2011  Reason for Consult: Anxiety   History of Present Illness:60yoF with h/o HTN, HL, hiatal hernia with severe reflux, current smoker, presents with severe HTN and chest pain. The patient had a negative cardiac workup. Psychiatry was consulted for continued anxiety. The patient reports feeling overwhelmed. She reports her anxiety has progressively worsened since her perimenopausal.  She reports multiple stressors, which has worsened since she has taken over the family trust fund after her father passed away.  She has little family support. She reports she has benefited in the past from Zoloft.    Past Psychiatric History: Patient denies a history of inpatient or outpatient psychiatric treatment. Patient reports that she has taken Zoloft in the past for anxiety.  She denies a history of suicidal or homicidal ideation, intent, or plans.    Past Medical History:     Past Medical History  Diagnosis Date  . Hypertension   . Hyperlipidemia   . Hiatal hernia   . Arthritis     "Whole body"       Past Surgical History  Procedure Date  . Cholecystectomy   . Tubal ligation   . Knee arthroscopy     left    Allergies:  Allergies  Allergen Reactions  . Latex Hives    Current Medications:  Prior to Admission medications   Medication Sig Start Date End Date Taking? Authorizing Provider  estradiol-norethindrone (ACTIVELLA) 1-0.5 MG per tablet Take 0.5 tablets by mouth daily.   Yes Historical Provider, MD  fish oil-omega-3 fatty acids 1000 MG capsule Take 2 g by mouth daily.   Yes Historical Provider, MD  lisinopril (PRINIVIL,ZESTRIL) 10 MG tablet Take 10 mg by mouth daily.   Yes Historical Provider, MD  Multiple Vitamin (MULTIVITAMIN) tablet Take 1 tablet by mouth daily.   Yes Historical Provider, MD  pantoprazole (PROTONIX) 40 MG tablet Take 40 mg by mouth daily.   Yes Historical Provider, MD  rosuvastatin (CRESTOR) 5  MG tablet Take 5 mg by mouth daily.   Yes Historical Provider, MD    Social History:    reports that she has been smoking.  She does not have any smokeless tobacco history on file. She reports that she does not drink alcohol or use illicit drugs.   Family History:    No family history on file.  Mental Status Examination/Evaluation: Objective:  Appearance: Casual  Psychomotor Activity:  Normal  Eye Contact::  Good  Speech:  Clear and Coherent  Volume:  Normal  Mood:  Anxious  Affect:  congruent  Thought Process:  Coherent  Orientation:  Full  Thought Content:  No auditory or visual halluciations.  Suicidal Thoughts:  No  Homicidal Thoughts:  No  Judgement:  Fair  Insight:  Fair    DIAGNOSIS:   AXIS I  Generalized Anxiety Disorder; Adjustment disorder with mixed mood  AXIS II  No diagnosis  AXIS III See medical notes.  AXIS IV other psychosocial or environmental problems and problems with primary support group  AXIS V 51-60 moderate symptoms     Assessment/Plan: 1. Patient would benefit from referral to outpatient counseling and psychiatry for follow up with her her medications.  2. Would recommend Vistaril 25 MG PO 1 TID PRN anxiety. An SSRI such as sertraline may be required if Vistaril and improvement of sleep does not improve anxiety or depression.. 3. Would  Recommend TSH to rule out hypothroidism or hyperthyroidism as a cause of  anxiety. 4. Patient would benefit from a short trial of trazodone 50-100 mg qhs for sleep as lack of sleep will contribute to anxiety and depression. Patient was informed about orthostatic hypotension, but should be reminded of this prior to prescribing this medication.

## 2011-11-02 NOTE — Progress Notes (Signed)
Pt is a 1 1/2 ppd smoker who wants to quit and is in contemplation stage. Discussed in detail the medical aids available to her and how they are used etc. Pt to think about it. She plans to call me once she arrives at a decision of which pharmacotherapy aid she prefers to use. Referred to 1-800 quit now for f/u and support. Discussed oral fixation substitutes, second hand smoke and in home smoking policy. Reviewed and gave pt Written education/contact information.

## 2011-11-02 NOTE — Progress Notes (Signed)
Clinical Social Work with Psych Services:  Followed up from afternoon consult and discussed case with attending.  Discussed with patient outpatient follow up for behaviors and medication management.  Placed information on patient's discharge paperwork with regards to scheduling her appointment.  Will follow up as needed.  Ashley Jacobs, MSW LCSW [phone]

## 2011-11-02 NOTE — Progress Notes (Signed)
Utilization Review Completed.Kristen Wilson T3/03/2012

## 2011-11-02 NOTE — ED Provider Notes (Signed)
History     CSN: [id number]  Arrival date & time 11/01/11  2052   First MD Initiated Contact with Patient 11/01/11 2108      Chief Complaint  Patient presents with  . Chest Pain    (Consider location/radiation/quality/duration/timing/severity/associated sxs/prior treatment) HPI Comments: The patient is a 61 year old female with a history of newly diagnosed hypertension, diagnosed by her primary care physician, and sent for referral and evaluation by Dr. Dory Peru with [facility], the patient's cardiologist. She was referred to the cardiologist because she had additionally reported to her primary care physician approximately 2-3 weeks of intermittent paroxysmal episodes of chest pain along with sensation of her heart fluttering. The patient's cardiologist has her on 24-hour cardiac monitoring currently. The patient has no known history of coronary artery disease, hyperlipidemia, diabetes, or arrhythmia. She reports that this evening around 7:00 she went to check her blood pressure and found it to be elevated, greater than 200 systolic, and she reports that at that time she developed chest pain, at her left chest, nonradiating, dull and light pressure, 8 of 10 in intensity at worst, and persistent. She took her blood pressure medication and called EMS to bring her to the emergency department for evaluation. On arrival, the patient was moderately hypertensive, anxious, in mild apparent distress, reporting left-sided chest pressure at approximately 8/10 severity with some mild shortness of breath.  Patient is a 61 y.o. female presenting with chest pain. The history is provided by the patient.  Chest Pain Episode onset: At approximately 7:00 PM. Duration of episode(s) is 3 hours. Chest pain occurs constantly. The chest pain is resolved (Chest pain resolved after nitroglycerin sublingual). Associated with: Hypertension. At its most intense, the pain is at 8/10. The pain is currently at 0/10. The  quality of the pain is described as dull and pressure-like (Left chest). The pain does not radiate. Exacerbated by: Nothing. Primary symptoms include shortness of breath and palpitations. Pertinent negatives for primary symptoms include no fever, no fatigue, no syncope, no cough, no wheezing, no abdominal pain, no nausea, no vomiting, no dizziness and no altered mental status.  The palpitations also occurred with shortness of breath. The palpitations did not occur with syncope or dizziness.  Pertinent negatives for associated symptoms include no claudication, no diaphoresis, no lower extremity edema, no near-syncope, no numbness, no orthopnea, no paroxysmal nocturnal dyspnea and no weakness. She tried aspirin and nitroglycerin (Resolution with nitroglycerin) for the symptoms. Risk factors: Hypertension, newly diagnosed.     Past Medical History  Diagnosis Date  . Hypertension   . Hyperlipidemia     Past Surgical History  Procedure Date  . Cholecystectomy   . Tubal ligation     No family history on file.  History  Substance Use Topics  . Smoking status: Not on file  . Smokeless tobacco: Not on file  . Alcohol Use: No    OB History    Grav Para Term Preterm Abortions TAB SAB Ect Mult Living                  Review of Systems  Constitutional: Negative for fever, chills, diaphoresis, activity change, appetite change and fatigue.  HENT: Negative for congestion, facial swelling, rhinorrhea, neck pain, neck stiffness and postnasal drip.   Eyes: Negative.   Respiratory: Positive for shortness of breath. Negative for cough, choking, chest tightness and wheezing.        Dyspnea on exertion  Cardiovascular: Positive for chest pain and palpitations. Negative  for orthopnea, claudication, leg swelling, syncope and near-syncope.  Gastrointestinal: Negative for nausea, vomiting, abdominal pain and abdominal distention.  Musculoskeletal: Negative.   Skin: Negative.   Neurological: Negative  for dizziness, syncope, speech difficulty, weakness, light-headedness, numbness and headaches.  Hematological: Does not bruise/bleed easily.  Psychiatric/Behavioral: Negative.  Negative for altered mental status.    Allergies  Latex  Home Medications   Current Outpatient Rx  Name Route Sig Dispense Refill  . LISINOPRIL 10 MG PO TABS Oral Take 10 mg by mouth daily.    Marland Kitchen PANTOPRAZOLE SODIUM 40 MG PO TBEC Oral Take 40 mg by mouth daily.    Marland Kitchen ROSUVASTATIN CALCIUM 5 MG PO TABS Oral Take 5 mg by mouth daily.      BP 142/71  Pulse 60  Temp(Src) 98.4 F (36.9 C) (Oral)  Resp 16  SpO2 99%  Physical Exam  Nursing note and vitals reviewed. Constitutional: She is oriented to person, place, and time. She appears well-developed and well-nourished. She appears distressed.  HENT:  Head: Normocephalic and atraumatic.  Mouth/Throat: Oropharynx is clear and moist.  Eyes: EOM are normal. Pupils are equal, round, and reactive to light.  Neck: Normal range of motion. Neck supple. No JVD present. No tracheal deviation present.  Cardiovascular: Normal rate, regular rhythm, normal heart sounds and intact distal pulses.   No extrasystoles are present. Exam reveals no gallop, no S3, no S4 and no friction rub.   No murmur heard. Pulmonary/Chest: Breath sounds normal. No accessory muscle usage or stridor. Not tachypneic. No respiratory distress. She has no wheezes. She has no rales. She exhibits no tenderness.  Abdominal: Soft. Bowel sounds are normal. She exhibits no distension. There is no tenderness.  Musculoskeletal: Normal range of motion. She exhibits no edema and no tenderness.  Neurological: She is alert and oriented to person, place, and time. She has normal reflexes. No cranial nerve deficit. Coordination normal.  Skin: Skin is warm and dry. No rash noted. She is not diaphoretic. No erythema. No pallor.  Psychiatric: Her behavior is normal. Judgment and thought content normal.       Anxious  appearing    ED Course  Procedures (including critical care time)   Date: 11/02/2011  Rate: 61  Rhythm: normal sinus rhythm  QRS Axis: normal  Intervals: normal  ST/T Wave abnormalities: normal  Conduction Disutrbances:none  Narrative Interpretation: Non-provocative EKG  Old EKG Reviewed: No significant changes  The patient's pain and shortness of breath was relieved completely after sublingual nitroglycerin. Her blood pressure now shows improved control as well.  Labs Reviewed  BASIC METABOLIC PANEL - Abnormal; Notable for the following:    GFR calc non Af Amer 88 (*)    All other components within normal limits  CBC  DIFFERENTIAL  TROPONIN I  PROTIME-INR  APTT   Dg Chest 2 View  11/01/2011  *RADIOLOGY REPORT*  Clinical Data: Pain.  Hypertension.  CHEST - 2 VIEW  Comparison: 09/12 and 02/14/2011  Findings: Heart size and vascularity are normal and the lungs are clear.  No significant osseous abnormality.  IMPRESSION: No acute disease.  Original Report Authenticated By: Gwynn Burly, M.D.     No diagnosis found.    MDM  ACS, MI, Musculoskeletal chest pain, costochondritis, GERD, Gastrointestinal Chest Pain, Pleuritic Chest Pain, Pneumonia, Pneumothorax, Pulmonary Embolism, Esophageal Spasm, Arrhythmia considered among other potential etiologies in the patient's differential diagnosis.  As the patient is artery being evaluated for possible cardiac disease, and with the nature and  character of her symptoms, her hypertension, and the relief of her symptoms with nitroglycerin, I will have the patient admitted for cardiac rule out.         Felisa Bonier, MD 11/02/11 [phone]

## 2011-11-02 NOTE — ED Notes (Signed)
Report to [facility] updated and patient ready for move to room 2025.

## 2011-11-03 MED ORDER — METOPROLOL TARTRATE 12.5 MG HALF TABLET: 12.5000 mg | ORAL_TABLET | Freq: Two times a day (BID) | ORAL | Status: DC

## 2011-11-03 MED ORDER — TRAZODONE HCL 50 MG PO TABS: 50.0000 mg | ORAL_TABLET | Freq: Every day | ORAL | Status: AC

## 2011-11-03 MED ORDER — HYDROXYZINE PAMOATE 50 MG PO CAPS: 50.0000 mg | ORAL_CAPSULE | Freq: Three times a day (TID) | ORAL | Status: AC | PRN

## 2011-11-03 MED ORDER — ASPIRIN 325 MG PO TBEC: 325.0000 mg | DELAYED_RELEASE_TABLET | Freq: Every day | ORAL | Status: AC

## 2011-11-03 NOTE — Progress Notes (Signed)
Patient ID: Kristen Wilson, female   DOB: 06-29-1951, 61 y.o.   MRN: [id number] Pt. Discharged 11/03/2011  10:59 AM Discharge instructions reviewed with patient/family. Patient/family verbalized understanding. All Rx's given. Questions answered as needed. Pt. Discharged to home with family/self.  Lurline Idol [facility]

## 2011-11-03 NOTE — Discharge Summary (Addendum)
Patient ID: ORIT SANVILLE MRN: [id number] DOB/AGE: [birth year] 62 y.o.  Admit date: 11/01/2011 Discharge date: 11/03/2011  Primary Care Physician:  Gweneth Dimitri, MD, MD   Discharge Diagnoses:    Present on Admission:  .Hypertension .Chest pain .Hyperlipidemia Anxiety Pulmonary nodule  Medication List  As of 11/03/2011 10:52 AM   TAKE these medications         aspirin 325 MG EC tablet   Take 1 tablet (325 mg total) by mouth daily.      estradiol-norethindrone 1-0.5 MG per tablet   Commonly known as: ACTIVELLA   Take 0.5 tablets by mouth daily.      fish oil-omega-3 fatty acids 1000 MG capsule   Take 2 g by mouth daily.      hydrOXYzine 50 MG capsule   Commonly known as: VISTARIL   Take 1 capsule (50 mg total) by mouth 3 (three) times daily as needed for anxiety.      lisinopril 10 MG tablet   Commonly known as: PRINIVIL,ZESTRIL   Take 10 mg by mouth daily.      metoprolol tartrate 12.5 mg Tabs   Commonly known as: LOPRESSOR   Take 0.5 tablets (12.5 mg total) by mouth 2 (two) times daily.      multivitamin tablet   Take 1 tablet by mouth daily.      pantoprazole 40 MG tablet   Commonly known as: PROTONIX   Take 40 mg by mouth daily.      rosuvastatin 5 MG tablet   Commonly known as: CRESTOR   Take 5 mg by mouth daily.      traZODone 50 MG tablet   Commonly known as: DESYREL   Take 1 tablet (50 mg total) by mouth at bedtime.             Consults:  Dr. Casimiro Needle Cooper/cardiology Dr. Ovidio Kin Kumar/psychiatry   Significant Diagnostic Studies:  Dg Chest 2 View  11/01/2011  *RADIOLOGY REPORT*  Clinical Data: Pain.  Hypertension.  CHEST - 2 VIEW  Comparison: 09/12 and 02/14/2011  Findings: Heart size and vascularity are normal and the lungs are clear.  No significant osseous abnormality.  IMPRESSION: No acute disease.  Original Report Authenticated By: Gwynn Burly, M.D.   Ct Angio Chest W/cm &/or Wo Cm  11/02/2011  *RADIOLOGY REPORT*  Clinical Data:  Left-sided chest pain.  Evaluate for pulmonary embolism or other acute abnormality.  CT ANGIOGRAPHY CHEST  Technique:  Multidetector CT imaging of the chest using the standard protocol during bolus administration of intravenous contrast. Multiplanar reconstructed images including MIPs were obtained and reviewed to evaluate the vascular anatomy.  Contrast: 80mL OMNIPAQUE IOHEXOL 350 MG/ML IV SOLN  Comparison: No priors.  Findings:  Mediastinum: Heart size is upper limits of normal. There is no significant pericardial fluid, thickening or pericardial calcification.  There are no filling defects within the pulmonary arterial tree to suggest underlying pulmonary embolism. No pathologically enlarged mediastinal or hilar lymph nodes. Esophagus is unremarkable in appearance.  No gross acute abnormality of the thoracic aorta (aorta is not completely opacified with IV contrast).  Lungs/Pleura: 6 mm ground-glass attenuation nodule in the apex of the right upper lobe (image 16 of series 5). There are no other larger more suspicious appearing pulmonary nodules or masses identified.  Background of mild diffuse bronchial wall thickening and very mild centrilobular emphysema (most notable in the apices). No consolidative airspace disease.  No pleural effusions.  Upper Abdomen: Status post cholecystectomy.  Musculoskeletal: There are no  aggressive appearing lytic or blastic lesions noted in the visualized portions of the skeleton.  IMPRESSION: 1.  No evidence of pulmonary embolism. 2.  No definite acute findings within the thorax to account for the patient's symptoms. 3.  6 mm ground-glass attenuation nodule in the apex of the right upper lobe. Initial follow-up by chest CT without contrast is recommended in 3 months to confirm persistence.   This recommendation follows the consensus statement: Recommendations for the Management of Subsolid Pulmonary Nodules Detected at CT:  A Statement from the Fleischner Society as published in  Radiology 2013; 266:304-317. 4.  Changes compatible with mild COPD, as above. 4.  Status post cholecystectomy.  Original Report Authenticated By: Florencia Reasons, M.D.    Brief H and P: For complete details please refer to admission H and P, but in brief  61yoF with h/o HTN, HL, hiatal hernia with severe reflux, current smoker, presents with severe HTN  and chest pain. Pt is able to relate her history and states she in the recent past, she has been getting evaluated  for chest pain and was referred to Dr. Konrad Felix in cardiology, who did a nuclear stress test and  echo which she states were completely normal, and this was just within the past few weeks. Due to  some sensations of fluttering, she has also been on a Holter monitor for the past 1.5 weeks.  She also states that her BP in the past 1-2 years has gotten much worse, but previously through  her life was always 130/80's. She was started on Lisinopril in the past couple days. She has been  measuring her BP's and has a log which shows BP's 140-180's / 90-110's, with many values that are  quite high. Tonight, she measured her BP and noted it to be >200 / 100 at which point she started getting very  anxious, agitated, and even started to cry. She called her neighbor an RN afraid she was going to  have a stroke. She then developed left sided chest pain, with no radiation and no SOB, dizziness,  lightheadedness, nausea, vomiting, SOB, or any other associated symptom. Of note, she was not  having chest pain until she started getting very agitated.  In the ED vitals were stable. Labs including chem, CBC, Trop, INR were all unremarkable. CXR was  negative. Admission was requested for a rule out MI. Her pain was possibly relieved with NTG.  Of note, pt was last seen in ED 01/2011 with chest pain and per notes "got worse when she spoke  with church members and got scared" and relieved with SL NTG then too. Notes indicate chest pain  was reproducible  but hurting in various spots when examined. Trop negative x2 and discharged.  She also endorses a h/o severe reflux with a known hiatal hernia, and in fact in the ED states she  was having bad acid pain and requesting food and drink to relieve it. She has been actively  smoking daily, has h/o HTN/HL, but denies DM or family Hx of cardiac disease. She is under a lot  of stress recently. Otherwise, her ROS is negative.    Hospital Course:  The patient was admitted and monitored on telemetry, cardiac enzymes were unremarkable , the patient was seen by cardiology service who felt that her chest pain atypical and vague he is probably contributing to chest pain and elevated blood pressure. Patient had extensive  cardiac workup as outpatient and no further inpatient evaluation was  recommended. Metoprolol was added to her blood pressure regimen and her blood pressure is currently controlled. CT of the chest showed no PE however showed pulmonary nodule, she will need repeat CT of the chest with contrast  In 3 months., deferred  to PCP as an outpatient , the patient was counseled on quitting smoking.  Patient was also seen by psychiatry service for anxiety and they recommended trazodone each bedtime for sleep and Vistaril when necessary for anxiety, if no improvement SSRI was recommended by psychiatry.Psychiatry also recommended to check TSH ,please check as outpatient  Patient was advised to follow up with her cardiologist as an outpatient and with [facility] outpatient  .  Subjective: Patient seen and examined, denies any chest pain or shortness of breath.  Filed Vitals:   11/03/11 1007  BP: 130/78  Pulse:   Temp:   Resp:     General: Alert, awake, oriented x3, in no acute distress. Heart: Regular rate and rhythm, without murmurs, rubs, gallops. Lungs: Clear to auscultation bilaterally. Abdomen: Soft, nontender, nondistended, positive bowel sounds. Extremities: No clubbing  cyanosis or edema with positive pedal pulses. Neuro: Grossly intact, nonfocal.   Disposition and Follow-up:  To home Followup with PCP in one week, follow with your cardiologist as scheduled Make appointment with a psychiatrist   Time spent on Discharge: Approximately 45 minutes at   Signed: Kamden Reber 11/03/2011, 10:52 AM

## 2012-01-25 ENCOUNTER — Other Ambulatory Visit: Payer: Self-pay | Admitting: Family Medicine

## 2012-01-25 DIAGNOSIS — J984 Other disorders of lung: Secondary | ICD-10-CM

## 2012-02-04 ENCOUNTER — Ambulatory Visit
Admission: RE | Admit: 2012-02-04 | Discharge: 2012-02-04 | Disposition: A | Discharge status: Home / Self Care | Payer: Federal, State, Local not specified - PPO | Source: Ambulatory Visit | Attending: Family Medicine | Admitting: Family Medicine

## 2012-02-04 DIAGNOSIS — J984 Other disorders of lung: Secondary | ICD-10-CM

## 2012-10-23 ENCOUNTER — Ambulatory Visit (INDEPENDENT_AMBULATORY_CARE_PROVIDER_SITE_OTHER): Payer: Federal, State, Local not specified - PPO | Admitting: Family Medicine

## 2012-10-23 VITALS — BP 173/94 | HR 73 | Temp 98.6°F | Resp 18 | Ht 62.0 in | Wt 167.0 lb

## 2012-10-23 DIAGNOSIS — R059 Cough, unspecified: Secondary | ICD-10-CM

## 2012-10-23 DIAGNOSIS — R05 Cough: Secondary | ICD-10-CM

## 2012-10-23 MED ORDER — ALBUTEROL SULFATE HFA 108 (90 BASE) MCG/ACT IN AERS: 2.0000 | INHALATION_SPRAY | Freq: Four times a day (QID) | RESPIRATORY_TRACT | Status: DC | PRN

## 2012-10-23 NOTE — Progress Notes (Signed)
Kristen Wilson is a 62 y.o. female who presents to Urgent Care today with complaints of concern for URI:  1.  Cough: Non-productive, Present x 1 day.  Sister who lives at home with her with similar symptoms, diagnosed with PNA.  Patient able to go about her usual daily activities.  No rhinorrhea, sinus congestion, sore throat.  She does smoke and has history of chronic bronchitis.  Has been treated in past with albuterol inhaler but hasn't had one of these at home for >6 months.  Cough not keeping her awake at night, described as mild in nature, no fevers or chills.    PMH reviewed.  Past Medical History  Diagnosis Date  . Hypertension   . Hyperlipidemia   . Hiatal hernia   . Arthritis     "Whole body"  . Allergy   . Depression   . GERD (gastroesophageal reflux disease)   . Anxiety    Past Surgical History  Procedure Laterality Date  . Cholecystectomy    . Tubal ligation    . Knee arthroscopy      left    Medications reviewed. Current Outpatient Prescriptions  Medication Sig Dispense Refill  . escitalopram (LEXAPRO) 20 MG tablet Take 20 mg by mouth daily.      Marland Kitchen estradiol-norethindrone (ACTIVELLA) 1-0.5 MG per tablet Take 0.5 tablets by mouth daily.      . fish oil-omega-3 fatty acids 1000 MG capsule Take 2 g by mouth daily.      Marland Kitchen lisinopril (PRINIVIL,ZESTRIL) 10 MG tablet Take 10 mg by mouth daily.      . metoprolol tartrate (LOPRESSOR) 12.5 mg TABS Take 0.5 tablets (12.5 mg total) by mouth 2 (two) times daily.  30 tablet  0  . Multiple Vitamin (MULTIVITAMIN) tablet Take 1 tablet by mouth daily.      . pantoprazole (PROTONIX) 40 MG tablet Take 40 mg by mouth 2 (two) times daily.       . rosuvastatin (CRESTOR) 5 MG tablet Take 5 mg by mouth daily.      Marland Kitchen albuterol (PROVENTIL HFA;VENTOLIN HFA) 108 (90 BASE) MCG/ACT inhaler Inhale 2 puffs into the lungs every 6 (six) hours as needed for wheezing.  1 Inhaler  0   No current facility-administered medications for this visit.     ROS as above otherwise neg.  No chest pain, palpitations, SOB, Fever, Chills, Abd pain, N/V/D.   Physical Exam:  BP 173/94  Pulse 73  Temp(Src) 98.6 F (37 C) (Oral)  Resp 18  Ht 5\' 2"  (1.575 m)  Wt 167 lb (75.751 kg)  BMI 30.54 kg/m2  SpO2 96% Gen:  Patient sitting on exam table, appears stated age in no acute distress Head: Normocephalic atraumatic Eyes: EOMI, PERRL, sclera and conjunctiva non-erythematous Ears:  Canals clear bilaterally.  TMs pearly gray bilaterally without erythema or bulging.   Nose:  Nasal turbinates WNL BL Mouth: Mucosa membranes moist. Tonsils +2, nonenlarged, non-erythematous. Neck: No cervical lymphadenopathy noted Heart:  RRR, no murmurs auscultated. Pulm:  Very minimal scattered wheezing at bases.     Assessment and Plan:  1.  Cough: - Likely viral URI versus simply chronic bronchitis/COPD - NO evidence of COPD exacerbation, sitting comfortably, minimal wheezing, non productive cough.  No fevers or signs of bacterial/focalizing infection.   - Refilled inhaler.  - FU if worsening.

## 2012-10-23 NOTE — Patient Instructions (Signed)
It was good to meet you.  Use the inhaler for relief.

## 2012-10-26 ENCOUNTER — Ambulatory Visit (INDEPENDENT_AMBULATORY_CARE_PROVIDER_SITE_OTHER): Payer: Federal, State, Local not specified - PPO | Admitting: Family Medicine

## 2012-10-26 VITALS — BP 152/84 | HR 60 | Temp 97.9°F | Resp 16 | Ht 61.0 in | Wt 163.0 lb

## 2012-10-26 DIAGNOSIS — R05 Cough: Secondary | ICD-10-CM

## 2012-10-26 DIAGNOSIS — B379 Candidiasis, unspecified: Secondary | ICD-10-CM

## 2012-10-26 DIAGNOSIS — B49 Unspecified mycosis: Secondary | ICD-10-CM

## 2012-10-26 DIAGNOSIS — J209 Acute bronchitis, unspecified: Secondary | ICD-10-CM

## 2012-10-26 DIAGNOSIS — R062 Wheezing: Secondary | ICD-10-CM

## 2012-10-26 DIAGNOSIS — R059 Cough, unspecified: Secondary | ICD-10-CM

## 2012-10-26 MED ORDER — HYDROCODONE-HOMATROPINE 5-1.5 MG/5ML PO SYRP: 5.0000 mL | ORAL_SOLUTION | Freq: Every evening | ORAL | Status: DC | PRN

## 2012-10-26 MED ORDER — AZITHROMYCIN 250 MG PO TABS: ORAL_TABLET | ORAL | Status: DC

## 2012-10-26 MED ORDER — IPRATROPIUM-ALBUTEROL 20-100 MCG/ACT IN AERS: 1.0000 | INHALATION_SPRAY | Freq: Four times a day (QID) | RESPIRATORY_TRACT | Status: DC | PRN

## 2012-10-26 MED ORDER — FLUCONAZOLE 150 MG PO TABS: 150.0000 mg | ORAL_TABLET | Freq: Once | ORAL | Status: DC

## 2012-10-26 NOTE — Progress Notes (Signed)
[facility]:  Office Visit  Chief Complaint:  Chief Complaint  Patient presents with  . Fever  . Cough    HPI: Kristen Wilson is a 62 y.o. female who complains of  Fever, wheezing, Tmax 102 on Thursday night, 101 on Friday and then 100.5 last night. Has been taking PCN QID for infection in her mouth for 5 days, ran out on Friday.  + coughing, green dc, chest pain with cough and her head throbs. Tobacco user. + chills.    Sister who lives at home with her with similar symptoms, diagnosed with PNA. Patient able to go about her usual daily activities. No rhinorrhea, sinus congestion, sore throat. She does smoke and has history of chronic bronchitis. Has been treated in past with albuterol inhaler but hasn't had one of these at home for >6 months. Cough not keeping her awake at night, described as mild in nature, no fevers or chills.    Past Medical History  Diagnosis Date  . Hypertension   . Hyperlipidemia   . Hiatal hernia   . Arthritis     "Whole body"  . Allergy   . Depression   . GERD (gastroesophageal reflux disease)   . Anxiety   . Osteoporosis    Past Surgical History  Procedure Laterality Date  . Cholecystectomy    . Tubal ligation    . Knee arthroscopy      left   History   Social History  . Marital Status: Widowed    Spouse Name: N/A    Number of Children: N/A  . Years of Education: N/A   Social History Main Topics  . Smoking status: Current Every Day Smoker -- 1.50 packs/day for 25 years  . Smokeless tobacco: None  . Alcohol Use: No  . Drug Use: No  . Sexually Active: No   Other Topics Concern  . None   Social History Narrative  . None   Family History  Problem Relation Age of Onset  . Cancer Mother     breast  . Arthritis Mother   . Diabetes Mother   . Cancer Father     bladder,multiple myeloma   Allergies  Allergen Reactions  . Latex Hives  . Percocet (Oxycodone-Acetaminophen) Nausea Only   Prior to Admission  medications   Medication Sig Start Date End Date Taking? Authorizing Provider  albuterol (PROVENTIL HFA;VENTOLIN HFA) 108 (90 BASE) MCG/ACT inhaler Inhale 2 puffs into the lungs every 6 (six) hours as needed for wheezing. 10/23/12   Tobey Grim, MD  escitalopram (LEXAPRO) 20 MG tablet Take 20 mg by mouth daily.    Historical Provider, MD  estradiol-norethindrone (ACTIVELLA) 1-0.5 MG per tablet Take 0.5 tablets by mouth daily.    Historical Provider, MD  fish oil-omega-3 fatty acids 1000 MG capsule Take 2 g by mouth daily.    Historical Provider, MD  lisinopril (PRINIVIL,ZESTRIL) 10 MG tablet Take 10 mg by mouth daily.    Historical Provider, MD  metoprolol tartrate (LOPRESSOR) 12.5 mg TABS Take 0.5 tablets (12.5 mg total) by mouth 2 (two) times daily. 11/03/11   Antonieta Pert, MD  Multiple Vitamin (MULTIVITAMIN) tablet Take 1 tablet by mouth daily.    Historical Provider, MD  pantoprazole (PROTONIX) 40 MG tablet Take 40 mg by mouth 2 (two) times daily.     Historical Provider, MD  rosuvastatin (CRESTOR) 5 MG tablet Take 5 mg by mouth daily.    Historical Provider, MD  ROS: The patient denies night sweats, unintentional weight loss, chest pain, palpitations,  dyspnea on exertion, nausea, vomiting, abdominal pain, dysuria, hematuria, melena, numbness, weakness, or tingling.   All other systems have been reviewed and were otherwise negative with the exception of those mentioned in the HPI and as above.    PHYSICAL EXAM: Filed Vitals:   10/26/12 1215  BP: 152/84  Pulse: 60  Temp: 97.9 F (36.6 C)  Resp: 16   Filed Vitals:   10/26/12 1215  Height: 5\' 1"  (1.549 m)  Weight: 163 lb (73.936 kg)   Body mass index is 30.81 kg/(m^2).  General: Alert, no acute distress HEENT:  Normocephalic, atraumatic, oropharynx patent. TM nl. No exudates. No sinus tenderness Cardiovascular:  Regular rate and rhythm, no rubs murmurs or gallops.  No Carotid bruits, radial pulse intact. No pedal edema.   Respiratory: Clear to auscultation bilaterally. + minimal exp wheezes,No  rales, or No rhonchi.  No cyanosis, no use of accessory musculature GI: No organomegaly, abdomen is soft and non-tender, positive bowel sounds.  No masses. Skin: No rashes. Neurologic: Facial musculature symmetric. Psychiatric: Patient is appropriate throughout our interaction. Lymphatic: No cervical lymphadenopathy Musculoskeletal: Gait intact.   LABS: Results for orders placed during the hospital encounter of 11/01/11  CBC      Result Value Range   WBC 8.1  4.0 - 10.5 K/uL   RBC 4.77  3.87 - 5.11 MIL/uL   Hemoglobin 15.0  12.0 - 15.0 g/dL   HCT 08.6  57.8 - 46.9 %   MCV 90.1  78.0 - 100.0 fL   MCH 31.4  26.0 - 34.0 pg   MCHC 34.9  30.0 - 36.0 g/dL   RDW 62.9  52.8 - 41.3 %   Platelets 167  150 - 400 K/uL  DIFFERENTIAL      Result Value Range   Neutrophils Relative 51  43 - 77 %   Neutro Abs 4.1  1.7 - 7.7 K/uL   Lymphocytes Relative 38  12 - 46 %   Lymphs Abs 3.1  0.7 - 4.0 K/uL   Monocytes Relative 6  3 - 12 %   Monocytes Absolute 0.5  0.1 - 1.0 K/uL   Eosinophils Relative 5  0 - 5 %   Eosinophils Absolute 0.4  0.0 - 0.7 K/uL   Basophils Relative 0  0 - 1 %   Basophils Absolute 0.0  0.0 - 0.1 K/uL  BASIC METABOLIC PANEL      Result Value Range   Sodium 144  135 - 145 mEq/L   Potassium 3.7  3.5 - 5.1 mEq/L   Chloride 107  96 - 112 mEq/L   CO2 28  19 - 32 mEq/L   Glucose, Bld 92  70 - 99 mg/dL   BUN 16  6 - 23 mg/dL   Creatinine, Ser 2.44  0.50 - 1.10 mg/dL   Calcium 01.0  8.4 - 27.2 mg/dL   GFR calc non Af Amer 88 (*) >90 mL/min   GFR calc Af Amer >90  >90 mL/min  TROPONIN I      Result Value Range   Troponin I <0.30  <0.30 ng/mL  PROTIME-INR      Result Value Range   Prothrombin Time 13.8  11.6 - 15.2 seconds   INR 1.04  0.00 - 1.49  APTT      Result Value Range   aPTT 31  24 - 37 seconds  CARDIAC PANEL(CRET KIN+CKTOT+MB+TROPI)  Result Value Range   Total CK 68  7 - 177 U/L    CK, MB 2.4  0.3 - 4.0 ng/mL   Troponin I <0.30  <0.30 ng/mL   Relative Index RELATIVE INDEX IS INVALID  0.0 - 2.5  CARDIAC PANEL(CRET KIN+CKTOT+MB+TROPI)      Result Value Range   Total CK 53  7 - 177 U/L   CK, MB 2.1  0.3 - 4.0 ng/mL   Troponin I <0.30  <0.30 ng/mL   Relative Index RELATIVE INDEX IS INVALID  0.0 - 2.5     EKG/XRAY:   Primary read interpreted by [facility] Conley Rolls at [facility].   ASSESSMENT/PLAN: Encounter Diagnoses  Name Primary?  . Acute bronchitis Yes  . Wheezing   . Cough   . Yeast infection     Rx Hydromet ( has had it before) Rx Zpack Rx Combivent Rx Diflucan for yeast with abx Advise to stop smoking F/u prn   LE, THAO PHUONG, DO 10/26/2012 1:01 PM

## 2012-12-03 ENCOUNTER — Ambulatory Visit: Payer: Federal, State, Local not specified - PPO

## 2012-12-03 ENCOUNTER — Ambulatory Visit (INDEPENDENT_AMBULATORY_CARE_PROVIDER_SITE_OTHER): Payer: Federal, State, Local not specified - PPO | Admitting: Internal Medicine

## 2012-12-03 VITALS — BP 145/84 | HR 64 | Temp 97.8°F | Resp 16 | Ht 61.0 in | Wt 163.0 lb

## 2012-12-03 DIAGNOSIS — R05 Cough: Secondary | ICD-10-CM

## 2012-12-03 DIAGNOSIS — R059 Cough, unspecified: Secondary | ICD-10-CM

## 2012-12-03 DIAGNOSIS — J029 Acute pharyngitis, unspecified: Secondary | ICD-10-CM

## 2012-12-03 LAB — POCT CBC
HCT, POC: 46.3 % (ref 37.7–47.9)
Lymph, poc: 2.7 (ref 0.6–3.4)
MCHC: 32.6 g/dL (ref 31.8–35.4)
MID (cbc): 0.5 (ref 0–0.9)
POC Granulocyte: 3.9 (ref 2–6.9)
POC LYMPH PERCENT: 38.3 %L (ref 10–50)
POC MID %: 7.4 %M (ref 0–12)
Platelet Count, POC: 172 10*3/uL (ref 142–424)
RDW, POC: 13.7 %

## 2012-12-03 MED ORDER — AZITHROMYCIN 500 MG PO TABS: 500.0000 mg | ORAL_TABLET | Freq: Every day | ORAL | Status: DC

## 2012-12-03 MED ORDER — PREDNISONE 50 MG PO TABS: ORAL_TABLET | ORAL | Status: DC

## 2012-12-03 NOTE — Progress Notes (Signed)
  Subjective:    Patient ID: Kristen Wilson, female    DOB: 1950/11/04, 62 y.o.   MRN: [id number]  HPI  Awoke today with sore throat felt like she had swelling in her throat. Took benadryl was able to swallow her secretions, coffee and to eat a muffin, slept all day after the benadryl. No fever. Has a cough with yellow sputum. Smoker. Throat discomfort is moderated in severity 5/10. No swelling of her tongue or neck. No stiff neck.  Review of Systems  Constitutional: Positive for activity change, appetite change and fatigue.  HENT: Positive for congestion. Negative for facial swelling, neck pain and neck stiffness.   Eyes: Negative.   Respiratory: Positive for cough. Negative for shortness of breath and wheezing.   Cardiovascular: Negative.   Gastrointestinal: Negative.   Endocrine: Negative.   Genitourinary: Negative.   Musculoskeletal: Negative.   Skin: Negative.   Allergic/Immunologic: Negative.   Neurological: Negative.   Hematological: Negative.   Psychiatric/Behavioral: Negative.        Objective:   Physical Exam  Nursing note and vitals reviewed. Constitutional: She is oriented to person, place, and time. She appears well-developed and well-nourished.  HENT:  Head: Normocephalic and atraumatic.  Right Ear: External ear normal.  Left Ear: External ear normal.  Mouth/Throat: No oropharyngeal exudate.  Pharyngeal irritation and errythema  Eyes: Conjunctivae and EOM are normal. Pupils are equal, round, and reactive to light.  Neck: Normal range of motion. Neck supple.  Cardiovascular: Normal rate, regular rhythm, normal heart sounds and intact distal pulses.   Pulmonary/Chest: Effort normal and breath sounds normal.  Abdominal: Soft. Bowel sounds are normal.  Musculoskeletal: Normal range of motion.  Neurological: She is alert and oriented to person, place, and time. She has normal reflexes.  Skin: Skin is warm and dry.  Psychiatric: She has a normal mood and affect. Her  behavior is normal. Judgment and thought content normal.     Results for orders placed in visit on 12/03/12  POCT RAPID STREP A (OFFICE)      Result Value Range   Rapid Strep A Screen Negative  Negative  POCT CBC      Result Value Range   WBC 7.1  4.6 - 10.2 K/uL   Lymph, poc 2.7  0.6 - 3.4   POC LYMPH PERCENT 38.3  10 - 50 %L   MID (cbc) 0.5  0 - 0.9   POC MID % 7.4  0 - 12 %M   POC Granulocyte 3.9  2 - 6.9   Granulocyte percent 54.3  37 - 80 %G   RBC 4.96  4.04 - 5.48 M/uL   Hemoglobin 15.1  12.2 - 16.2 g/dL   HCT, POC 40.9  81.1 - 47.9 %   MCV 93.4  80 - 97 fL   MCH, POC 30.4  27 - 31.2 pg   MCHC 32.6  31.8 - 35.4 g/dL   RDW, POC 91.4     Platelet Count, POC 172  142 - 424 K/uL   MPV 10.1  0 - 99.8 fL    [facility] reading (PRIMARY) by  Dr. Mindi Junker increased bronchial markings consistent with bronchitus  Assessment & Plan:  sore throat with cough in a smoker. Bronchitis pharyngitis will rx with Zithromax ans short course of steroids.

## 2012-12-03 NOTE — Patient Instructions (Addendum)
Zithromax 1 tab daily for 5 days. Prednisone 1 tab daily for 3 days with food in am.Proventil inhaler as directed if needed for cough and wheezing. If your symptoms worsen return to the er.Patient instructed to return to the office if she is not completely improved with the medication. She is informed that since she is a smoker she may need to be referred to ENT and have laryngoscopy if she continues to have a sensation of pain in her throat.

## 2013-03-18 ENCOUNTER — Emergency Department (HOSPITAL_COMMUNITY)
Admission: EM | Admit: 2013-03-18 | Discharge: 2013-03-18 | Disposition: A | Discharge status: Home / Self Care | Payer: Federal, State, Local not specified - PPO | Attending: Emergency Medicine | Admitting: Emergency Medicine

## 2013-03-18 ENCOUNTER — Emergency Department (HOSPITAL_COMMUNITY): Payer: Federal, State, Local not specified - PPO

## 2013-03-18 ENCOUNTER — Encounter (HOSPITAL_COMMUNITY): Payer: Self-pay | Admitting: Emergency Medicine

## 2013-03-18 DIAGNOSIS — Z79899 Other long term (current) drug therapy: Secondary | ICD-10-CM | POA: Insufficient documentation

## 2013-03-18 DIAGNOSIS — Z8719 Personal history of other diseases of the digestive system: Secondary | ICD-10-CM | POA: Insufficient documentation

## 2013-03-18 DIAGNOSIS — K91840 Postprocedural hemorrhage and hematoma of a digestive system organ or structure following a digestive system procedure: Secondary | ICD-10-CM

## 2013-03-18 DIAGNOSIS — K625 Hemorrhage of anus and rectum: Secondary | ICD-10-CM

## 2013-03-18 DIAGNOSIS — T819XXA Unspecified complication of procedure, initial encounter: Secondary | ICD-10-CM | POA: Insufficient documentation

## 2013-03-18 DIAGNOSIS — Z8739 Personal history of other diseases of the musculoskeletal system and connective tissue: Secondary | ICD-10-CM | POA: Insufficient documentation

## 2013-03-18 DIAGNOSIS — F3289 Other specified depressive episodes: Secondary | ICD-10-CM | POA: Insufficient documentation

## 2013-03-18 DIAGNOSIS — F17200 Nicotine dependence, unspecified, uncomplicated: Secondary | ICD-10-CM | POA: Insufficient documentation

## 2013-03-18 DIAGNOSIS — F329 Major depressive disorder, single episode, unspecified: Secondary | ICD-10-CM | POA: Insufficient documentation

## 2013-03-18 DIAGNOSIS — I10 Essential (primary) hypertension: Secondary | ICD-10-CM | POA: Insufficient documentation

## 2013-03-18 DIAGNOSIS — E785 Hyperlipidemia, unspecified: Secondary | ICD-10-CM | POA: Insufficient documentation

## 2013-03-18 DIAGNOSIS — K219 Gastro-esophageal reflux disease without esophagitis: Secondary | ICD-10-CM | POA: Insufficient documentation

## 2013-03-18 DIAGNOSIS — Y833 Surgical operation with formation of external stoma as the cause of abnormal reaction of the patient, or of later complication, without mention of misadventure at the time of the procedure: Secondary | ICD-10-CM | POA: Insufficient documentation

## 2013-03-18 DIAGNOSIS — F411 Generalized anxiety disorder: Secondary | ICD-10-CM | POA: Insufficient documentation

## 2013-03-18 LAB — CBC WITH DIFFERENTIAL/PLATELET
Basophils Absolute: 0 10*3/uL (ref 0.0–0.1)
Basophils Relative: 1 % (ref 0–1)
Eosinophils Relative: 5 % (ref 0–5)
HCT: 41.7 % (ref 36.0–46.0)
MCHC: 35 g/dL (ref 30.0–36.0)
MCV: 89.7 fL (ref 78.0–100.0)
Monocytes Absolute: 0.4 10*3/uL (ref 0.1–1.0)
Platelets: 160 10*3/uL (ref 150–400)
RDW: 12.7 % (ref 11.5–15.5)
WBC: 6.1 10*3/uL (ref 4.0–10.5)

## 2013-03-18 LAB — BASIC METABOLIC PANEL
Calcium: 9.3 mg/dL (ref 8.4–10.5)
Creatinine, Ser: 0.76 mg/dL (ref 0.50–1.10)
GFR calc Af Amer: >90 mL/min (ref >90)
GFR calc non Af Amer: 89 mL/min — ABNORMAL LOW (ref >90)
Sodium: 141 mEq/L (ref 135–145)

## 2013-03-18 NOTE — ED Notes (Signed)
Patient stated GI procedure July 14th and rectal bleeding developed 2 days ago.

## 2013-03-18 NOTE — ED Provider Notes (Signed)
History    CSN: [id number] Arrival date & time 03/18/13  1109  First MD Initiated Contact with Patient 03/18/13 1245     Chief Complaint  Patient presents with  . Rectal Bleeding   (Consider location/radiation/quality/duration/timing/severity/associated sxs/prior Treatment) HPI  Kristen Wilson is a(n) 62 y.o. female who presents to the emergency department chief complaint of rectal bleeding after colonoscopy.  Patient states that she [facility].  She had some intermittent spotting however 4 days ago she started noticing heav heavier bleeding from her rectum.  She states that this morning she had a bowel movement and filled the toilet with blood.  Her rectal bleeding has included both dark clots as well as bright red blood.  The patient called Dr. Dulce Sellar today who suggested she come to the emergency department.  She denies symptoms of dizziness, weakness, fatigue, racing heart were orthostasis.  The patient denies any fevers, chills, nausea, vomiting, constipation.  She has a history of chronic diarrhea.  Patient denies any urinary symptoms.  Past Medical History  Diagnosis Date  . Hypertension   . Hyperlipidemia   . Hiatal hernia   . Arthritis     "Whole body"  . Allergy   . Depression   . GERD (gastroesophageal reflux disease)   . Anxiety   . Osteoporosis    Past Surgical History  Procedure Laterality Date  . Cholecystectomy    . Tubal ligation    . Knee arthroscopy      left   Family History  Problem Relation Age of Onset  . Cancer Mother     breast  . Arthritis Mother   . Diabetes Mother   . Cancer Father     bladder,multiple myeloma   History  Substance Use Topics  . Smoking status: Current Every Day Smoker -- 1.50 packs/day for 25 years  . Smokeless tobacco: Not on file  . Alcohol Use: No   OB History   Grav Para Term Preterm Abortions TAB SAB Ect Mult Living                 Review of  Systems Ten systems reviewed and are negative for acute change, except as noted in the HPI.   Allergies  Latex and Percocet  Home Medications   Current Outpatient Rx  Name  Route  Sig  Dispense  Refill  . DULoxetine (CYMBALTA) 60 MG capsule   Oral   Take 60 mg by mouth daily.         Marland Kitchen estradiol-norethindrone (ACTIVELLA) 1-0.5 MG per tablet   Oral   Take 0.5 tablets by mouth daily.         . fish oil-omega-3 fatty acids 1000 MG capsule   Oral   Take 1 g by mouth every other day.          . lisinopril (PRINIVIL,ZESTRIL) 10 MG tablet   Oral   Take 10 mg by mouth daily.         . metoprolol tartrate (LOPRESSOR) 25 MG tablet   Oral   Take 12.5 mg by mouth 2 (two) times daily.         . Multiple Vitamin (MULTIVITAMIN) tablet   Oral   Take 1 tablet by mouth daily.         . pantoprazole (PROTONIX) 40 MG tablet   Oral   Take 40 mg by mouth 2 (two) times daily.          Marland Kitchen  rosuvastatin (CRESTOR) 5 MG tablet   Oral   Take 5 mg by mouth at bedtime.           BP 113/92  Pulse 62  Temp(Src) 98 F (36.7 C) (Oral)  Resp 16  SpO2 97% Physical Exam Physical Exam  Nursing note and vitals reviewed. Constitutional: She is oriented to person, place, and time. She appears well-developed and well-nourished. No distress.  HENT:  Head: Normocephalic and atraumatic.  Eyes: Conjunctivae normal and EOM are normal. Pupils are equal, round, and reactive to light. No scleral icterus.  Neck: Normal range of motion.  Cardiovascular: Normal rate, regular rhythm and normal heart sounds.  Exam reveals no gallop and no friction rub.   No murmur heard. Pulmonary/Chest: Effort normal and breath sounds normal. No respiratory distress.  Abdominal: Soft. Bowel sounds are normal. She exhibits no distension and no mass. There is no tenderness. There is no guarding.  Neurological: She is alert and oriented to person, place, and time.  Skin: Skin is warm and dry. She is not  diaphoretic.  Digital Rectal Exam reveals sphincter with good tone.  Multiple non-thrombosed, external hemorrhoids. No masses or fissures. Stool color is brown dark blood is noted.   ED Course  Procedures (including critical care time) Labs Reviewed  BASIC METABOLIC PANEL - Abnormal; Notable for the following:    GFR calc non Af Amer 89 (*)    All other components within normal limits  CBC WITH DIFFERENTIAL   Dg Abd Acute W/chest  03/18/2013   *RADIOLOGY REPORT*  Clinical Data: Passing blood.  Evaluate for pneumoperitoneum.  ACUTE ABDOMEN SERIES (ABDOMEN 2 VIEW & CHEST 1 VIEW)  Comparison: Chest radiograph 12/03/2012.  Findings: Frontal view of the chest shows midline trachea and normal heart size.  Lungs are clear.  Heart size normal.  No pericardial effusion.  Two views of the abdomen show gas and scattered air fluid levels in small bowel and colon.  No definite small bowel dilatation. Surgical clips in right upper quadrant.  No definite free air.  IMPRESSION: Bowel gas pattern is somewhat nonspecific but without evidence of overt obstruction.  No definite free air.   Original Report Authenticated By: Leanna Battles, M.D.   No diagnosis found.  MDM  2:07 PM BP 113/92  Pulse 62  Temp(Src) 98 F (36.7 C) (Oral)  Resp 16  SpO2 97% Patient with rectal bleeding.  She's had no bleeding bowel movements here in the emergency department.  She appears hemodynamically stable.  She denies any pain.  She has a negative xray with no signs of free air. I have spoken with the on call doc from [facility] (Dr. Evette Cristal) who has asked that the patient be placed on clear liquids. Follow up with the gi specialists  In 2 days for repeat hgb.  She is to return to ED for signs and sxs of acute anemia Patient expresses understanding and agrees with plan of care.   Arthor Captain, PA-C 03/18/13 1723

## 2013-03-18 NOTE — ED Notes (Addendum)
Pt c/o rectal bleeding x 2 days after colonoscopy; pt sts some dark and some BRB with clots; pt had labs drawn today at [facility]

## 2013-03-20 ENCOUNTER — Encounter (HOSPITAL_COMMUNITY): Payer: Self-pay | Admitting: *Deleted

## 2013-03-20 ENCOUNTER — Encounter (HOSPITAL_COMMUNITY)
Admission: RE | Disposition: A | Discharge status: Home / Self Care | Payer: Self-pay | Source: Ambulatory Visit | Attending: Gastroenterology

## 2013-03-20 ENCOUNTER — Ambulatory Visit (HOSPITAL_COMMUNITY)
Admission: RE | Admit: 2013-03-20 | Discharge: 2013-03-20 | Disposition: A | Discharge status: Home / Self Care | Payer: Federal, State, Local not specified - PPO | Source: Ambulatory Visit | Attending: Gastroenterology | Admitting: Gastroenterology

## 2013-03-20 DIAGNOSIS — K5730 Diverticulosis of large intestine without perforation or abscess without bleeding: Secondary | ICD-10-CM | POA: Insufficient documentation

## 2013-03-20 DIAGNOSIS — Z860100 Personal history of colon polyps, unspecified: Secondary | ICD-10-CM | POA: Insufficient documentation

## 2013-03-20 DIAGNOSIS — K625 Hemorrhage of anus and rectum: Secondary | ICD-10-CM | POA: Insufficient documentation

## 2013-03-20 DIAGNOSIS — K648 Other hemorrhoids: Secondary | ICD-10-CM | POA: Insufficient documentation

## 2013-03-20 HISTORY — DX: Shortness of breath: R06.02

## 2013-03-20 HISTORY — PX: COLONOSCOPY: SHX5424

## 2013-03-20 SURGERY — COLONOSCOPY
Anesthesia: Moderate Sedation

## 2013-03-20 MED ORDER — MIDAZOLAM HCL 10 MG/2ML IJ SOLN
INTRAMUSCULAR | Status: AC
  Filled 2013-03-20: qty 4

## 2013-03-20 MED ORDER — FENTANYL CITRATE 0.05 MG/ML IJ SOLN
INTRAMUSCULAR | Status: DC | PRN
  Administered 2013-03-20 (×3): 25 ug via INTRAVENOUS

## 2013-03-20 MED ORDER — FENTANYL CITRATE 0.05 MG/ML IJ SOLN
INTRAMUSCULAR | Status: AC
  Filled 2013-03-20: qty 4

## 2013-03-20 MED ORDER — MIDAZOLAM HCL 5 MG/5ML IJ SOLN
INTRAMUSCULAR | Status: DC | PRN
  Administered 2013-03-20 (×3): 2.5 mg via INTRAVENOUS

## 2013-03-20 MED ORDER — DIPHENHYDRAMINE HCL 50 MG/ML IJ SOLN
INTRAMUSCULAR | Status: DC | PRN
  Administered 2013-03-20: 25 mg via INTRAVENOUS

## 2013-03-20 MED ORDER — DIPHENHYDRAMINE HCL 50 MG/ML IJ SOLN
INTRAMUSCULAR | Status: AC
  Filled 2013-03-20: qty 1

## 2013-03-20 MED ORDER — SODIUM CHLORIDE 0.9 % IV SOLN: INTRAVENOUS | Status: DC

## 2013-03-20 NOTE — H&P (Signed)
The patient is a 62 year old female who presents today to the hospital for outpatient colonoscopy. She had a colonoscopy done about 10 days ago with findings of a 12 mm polyp in the transverse colon which was removed. She was also found to have diverticulosis. She did fine for a few days after the colonoscopy but then started to notice rectal bleeding. She has continued to have episodes of rectal bleeding and went to the emergency room a couple of days ago where other than the rectal bleeding she felt fine and her hemoglobin was normal and therefore she was discharged. She came back into the office today to have her hemoglobin checked and stated that she was still experiencing some rectal bleeding. Her hemoglobin today in the office was normal. She continues to feel fine except for the rectal bleeding. It was elected to bring her in to do a colonoscopy to see what might be going on.  Physical:  Vital signs are stable as noted  She is in no distress  Heart regular rhythm  Lungs clear  Abdomen: Bowel sounds are present, soft, mild discomfort to palpation but no rebound or guarding  Impression: Rectal bleeding in a patient who had a polypectomy about 10 days ago  Plan: Colonoscopy to further investigate

## 2013-03-20 NOTE — Op Note (Signed)
[facility] [address] [place], [address]   COLONOSCOPY PROCEDURE REPORT  PATIENT: Kristen Wilson, Kristen Wilson  MR#: [id number] BIRTHDATE: 25-Aug-1951 , 61  yrs. old GENDER: Female ENDOSCOPIST: Wandalee Ferdinand, MD REFERRED BY: PROCEDURE DATE:  03/20/2013 PROCEDURE:   colonoscopy ASA CLASS:   2 INDICATIONS:lower gastrointestinal bleeding, status post polypectomy about 10 days ago. MEDICATIONS: fentanyl 75 mcg IV, Versed 7.5 mg IV, Benadryl 25 mg IV  DESCRIPTION OF PROCEDURE:   After the risks benefits and alternatives of the procedure were thoroughly explained, informed consent was obtained.  digital rectal exam was normal        The Pentax colonoscope       endoscope was introduced through the anus and advanced to the cecum     . No adverse events experienced. The quality of the prep was good       The instrument was then slowly withdrawn as the colon was fully examined.    The cecum and descending colon were normal. At the level of the proximal transverse colon right about at the hepatic flexure area the polypectomy site was seen. This had a clean base. This is noted on image 005. There is no stigmata of bleeding at this site. The rest of the transverse colon looked normal. The descending colon looked normal. There were scattered diverticula in the sigmoid region. The rectum looked normal. On retroflexion some hemorrhoids were seen with some irritation at the site. This is noted on image 010 and image 011. There was not a drop of blood in the colon.      The time to cecum=  .  Withdrawal time=  .  The scope was withdrawn and the procedure completed. COMPLICATIONS: There were no complications.  ENDOSCOPIC IMPRESSION:#1. Clean-based polypectomy  #2. Diverticulosis  #3. Internal hemorrhoids with some irritation at this site.  #4. No blood at all in the colon.  RECOMMENDATIONS:discharge from outpatient endoscopy center at [facility]. Followup when  necessary.   eSigned:  Wandalee Ferdinand, MD 03/20/2013 3:57 PM   cc:   PATIENT NAME:  [name], [name] MR#: [id number]

## 2013-03-23 ENCOUNTER — Encounter (HOSPITAL_COMMUNITY): Payer: Self-pay | Admitting: Gastroenterology

## 2013-03-23 NOTE — ED Provider Notes (Signed)
Medical screening examination/treatment/procedure(s) were performed by non-physician practitioner and as supervising physician I was immediately available for consultation/collaboration.  Doug Sou, MD 03/23/13 1441

## 2013-04-20 IMAGING — CR DG CHEST 2V
2 series · 2 of 2 positions shown · non-contrast
Comparison: [DATE] and 02/14/2011

CLINICAL DATA: Pain.  Hypertension.

CHEST - 2 VIEW

[w chest pa]
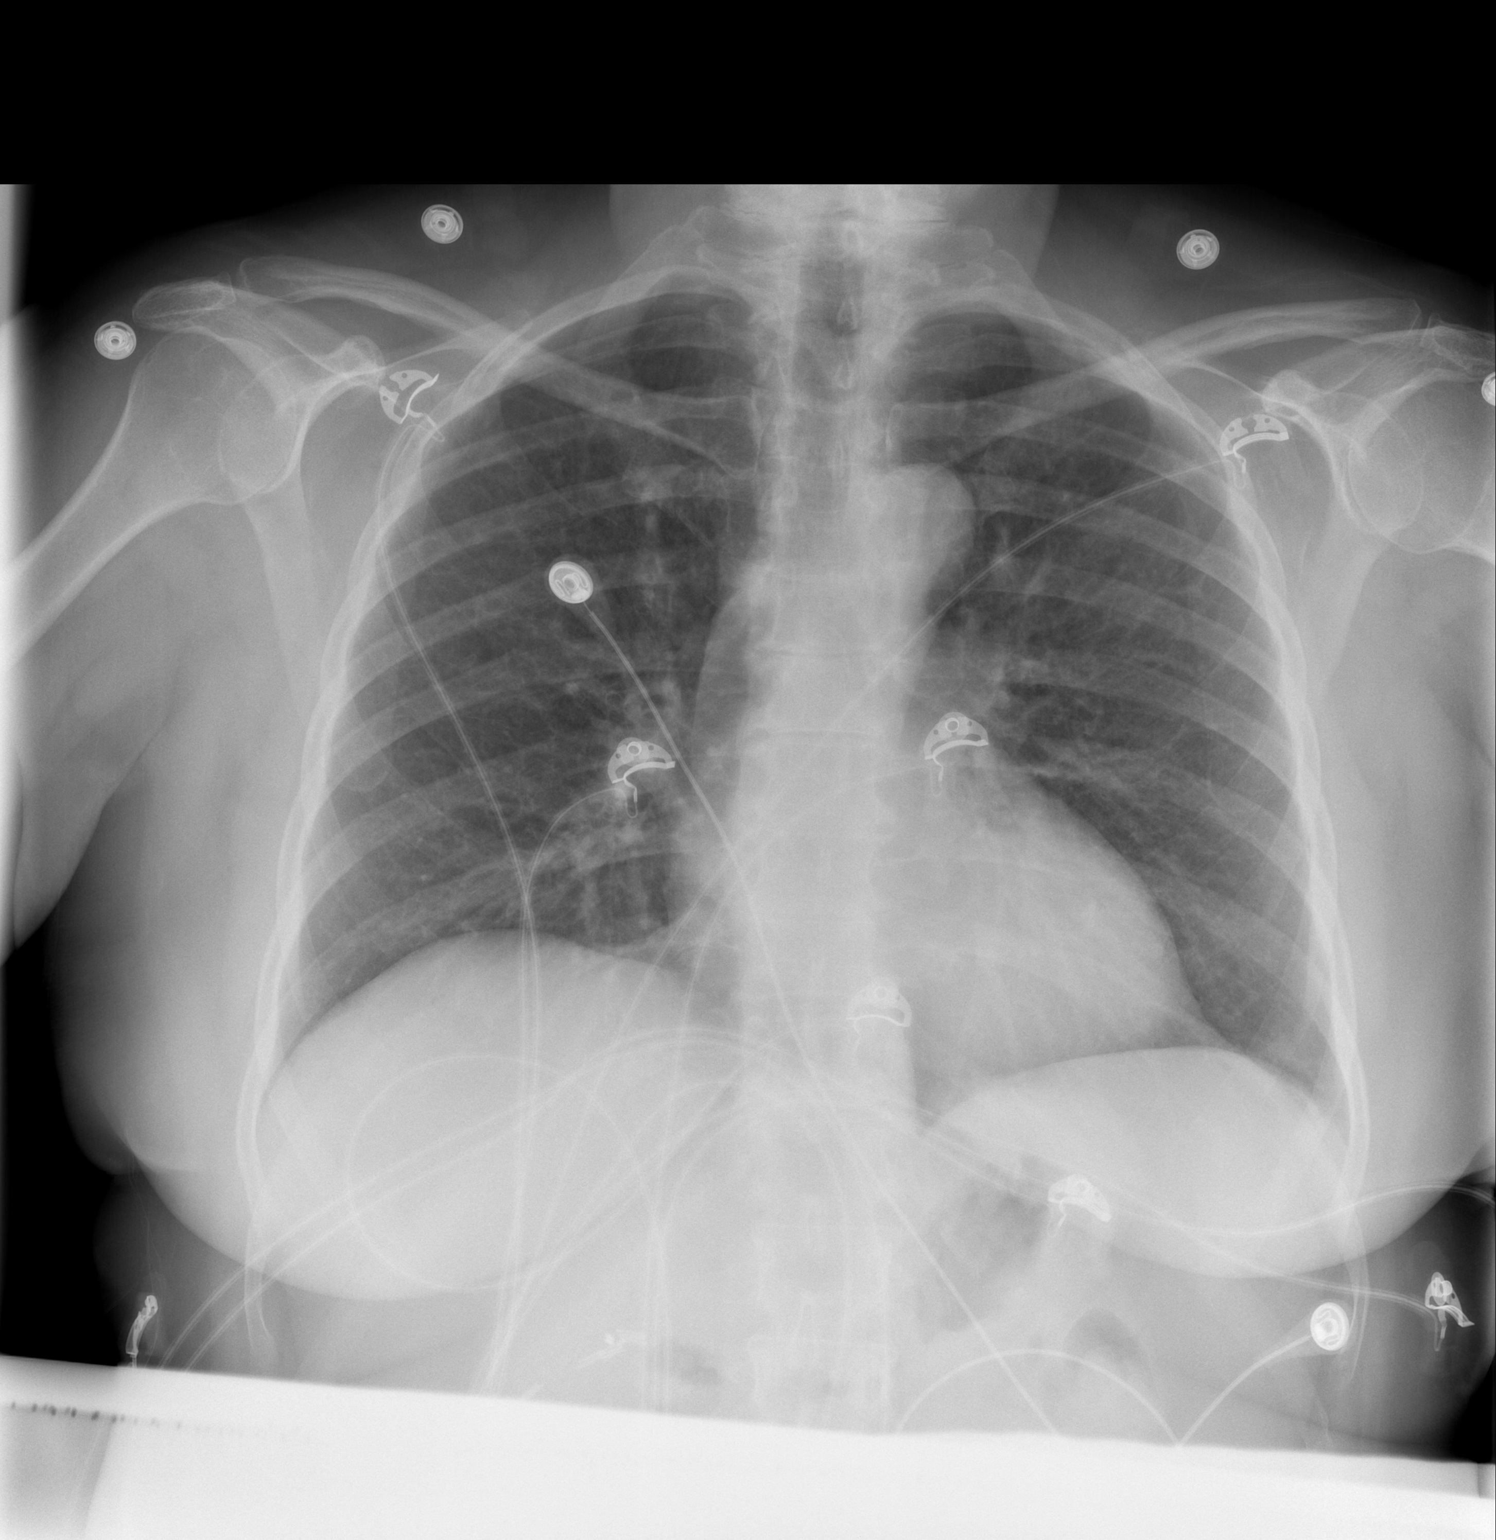

[w chest lat]
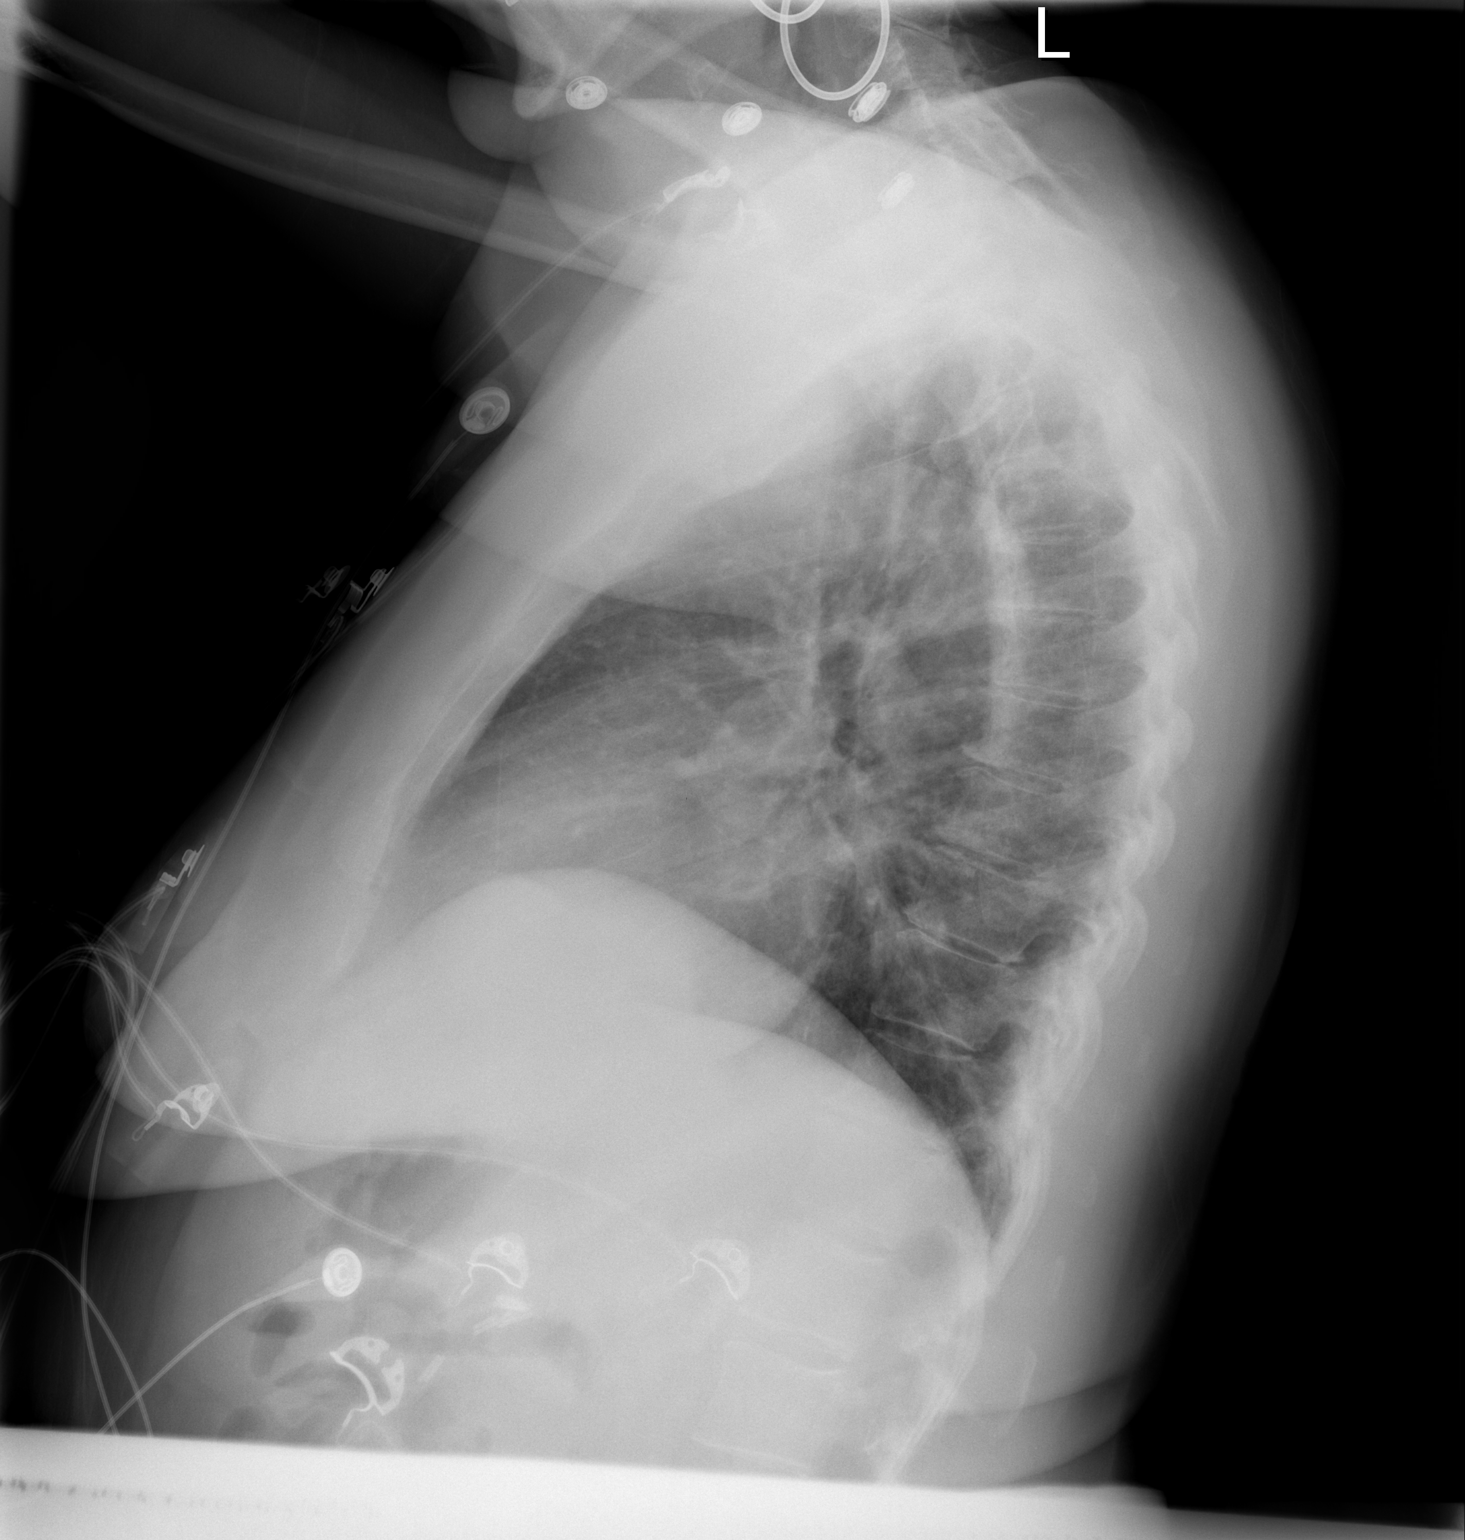

[2 of 2 positions shown; findings below may reference images not displayed]

FINDINGS: Heart size and vascularity are normal and the lungs are
clear.  No significant osseous abnormality.
IMPRESSION: No acute disease.

## 2013-10-07 ENCOUNTER — Emergency Department (HOSPITAL_COMMUNITY): Payer: Federal, State, Local not specified - PPO

## 2013-10-07 ENCOUNTER — Emergency Department (HOSPITAL_COMMUNITY)
Admission: EM | Admit: 2013-10-07 | Discharge: 2013-10-07 | Disposition: A | Discharge status: Home / Self Care | Payer: Federal, State, Local not specified - PPO | Attending: Emergency Medicine | Admitting: Emergency Medicine

## 2013-10-07 ENCOUNTER — Encounter (HOSPITAL_COMMUNITY): Payer: Self-pay | Admitting: Emergency Medicine

## 2013-10-07 DIAGNOSIS — F3289 Other specified depressive episodes: Secondary | ICD-10-CM | POA: Insufficient documentation

## 2013-10-07 DIAGNOSIS — H53149 Visual discomfort, unspecified: Secondary | ICD-10-CM | POA: Insufficient documentation

## 2013-10-07 DIAGNOSIS — E785 Hyperlipidemia, unspecified: Secondary | ICD-10-CM | POA: Insufficient documentation

## 2013-10-07 DIAGNOSIS — M810 Age-related osteoporosis without current pathological fracture: Secondary | ICD-10-CM | POA: Insufficient documentation

## 2013-10-07 DIAGNOSIS — F329 Major depressive disorder, single episode, unspecified: Secondary | ICD-10-CM | POA: Insufficient documentation

## 2013-10-07 DIAGNOSIS — K219 Gastro-esophageal reflux disease without esophagitis: Secondary | ICD-10-CM | POA: Insufficient documentation

## 2013-10-07 DIAGNOSIS — R42 Dizziness and giddiness: Secondary | ICD-10-CM | POA: Insufficient documentation

## 2013-10-07 DIAGNOSIS — I10 Essential (primary) hypertension: Secondary | ICD-10-CM | POA: Insufficient documentation

## 2013-10-07 DIAGNOSIS — M129 Arthropathy, unspecified: Secondary | ICD-10-CM | POA: Insufficient documentation

## 2013-10-07 DIAGNOSIS — Z91040 Latex allergy status: Secondary | ICD-10-CM | POA: Insufficient documentation

## 2013-10-07 DIAGNOSIS — Z79899 Other long term (current) drug therapy: Secondary | ICD-10-CM | POA: Insufficient documentation

## 2013-10-07 DIAGNOSIS — F17200 Nicotine dependence, unspecified, uncomplicated: Secondary | ICD-10-CM | POA: Insufficient documentation

## 2013-10-07 DIAGNOSIS — K589 Irritable bowel syndrome without diarrhea: Secondary | ICD-10-CM | POA: Insufficient documentation

## 2013-10-07 DIAGNOSIS — IMO0002 Reserved for concepts with insufficient information to code with codable children: Secondary | ICD-10-CM | POA: Insufficient documentation

## 2013-10-07 DIAGNOSIS — F411 Generalized anxiety disorder: Secondary | ICD-10-CM | POA: Insufficient documentation

## 2013-10-07 DIAGNOSIS — H538 Other visual disturbances: Secondary | ICD-10-CM | POA: Insufficient documentation

## 2013-10-07 HISTORY — DX: Irritable bowel syndrome, unspecified: K58.9

## 2013-10-07 LAB — BASIC METABOLIC PANEL
BUN: 14 mg/dL (ref 6–23)
CO2: 29 mEq/L (ref 19–32)
Calcium: 9.2 mg/dL (ref 8.4–10.5)
Chloride: 103 mEq/L (ref 96–112)
Creatinine, Ser: 0.81 mg/dL (ref 0.50–1.10)
GFR calc Af Amer: 88 mL/min — ABNORMAL LOW (ref >90)
GFR calc non Af Amer: 76 mL/min — ABNORMAL LOW (ref >90)
Glucose, Bld: 97 mg/dL (ref 70–99)
Potassium: 3.9 mEq/L (ref 3.7–5.3)
Sodium: 142 mEq/L (ref 137–147)

## 2013-10-07 LAB — CBC
HCT: 44.5 % (ref 36.0–46.0)
Hemoglobin: 15.4 g/dL — ABNORMAL HIGH (ref 12.0–15.0)
MCH: 31 pg (ref 26.0–34.0)
MCHC: 34.6 g/dL (ref 30.0–36.0)
MCV: 89.7 fL (ref 78.0–100.0)
Platelets: 160 10*3/uL (ref 150–400)
RBC: 4.96 MIL/uL (ref 3.87–5.11)
RDW: 12.4 % (ref 11.5–15.5)
WBC: 6.8 10*3/uL (ref 4.0–10.5)

## 2013-10-07 MED ORDER — TRAMADOL HCL 50 MG PO TABS
50.0000 mg | ORAL_TABLET | Freq: Once | ORAL | Status: AC
  Administered 2013-10-07: 50 mg via ORAL
  Filled 2013-10-07: qty 1

## 2013-10-07 MED ORDER — DIAZEPAM 5 MG/ML IJ SOLN
5.0000 mg | Freq: Once | INTRAMUSCULAR | Status: AC
  Administered 2013-10-07: 5 mg via INTRAVENOUS
  Filled 2013-10-07 (×2): qty 2

## 2013-10-07 MED ORDER — DIAZEPAM 5 MG PO TABS: 5.0000 mg | ORAL_TABLET | Freq: Four times a day (QID) | ORAL | Status: DC | PRN

## 2013-10-07 MED ORDER — ACETAMINOPHEN 325 MG PO TABS
650.0000 mg | ORAL_TABLET | Freq: Once | ORAL | Status: AC
  Administered 2013-10-07: 650 mg via ORAL
  Filled 2013-10-07: qty 2

## 2013-10-07 NOTE — Discharge Instructions (Signed)
Dizziness Dizziness is a common problem. It is a feeling of unsteadiness or lightheadedness. You may feel like you are about to faint. Dizziness can lead to injury if you stumble or fall. A person of any age group can suffer from dizziness, but dizziness is more common in older adults. CAUSES  Dizziness can be caused by many different things, including:  Middle ear problems.  Standing for too long.  Infections.  An allergic reaction.  Aging.  An emotional response to something, such as the sight of blood.  Side effects of medicines.  Fatigue.  Problems with circulation or blood pressure.  Excess use of alcohol, medicines, or illegal drug use.  Breathing too fast (hyperventilation).  An arrhythmia or problems with your heart rhythm.  Low red blood cell count (anemia).  Pregnancy.  Vomiting, diarrhea, fever, or other illnesses that cause dehydration.  Diseases or conditions such as Parkinson's disease, high blood pressure (hypertension), diabetes, and thyroid problems.  Exposure to extreme heat. DIAGNOSIS  To find the cause of your dizziness, your caregiver may do a physical exam, lab tests, radiologic imaging scans, or an electrocardiography test (ECG).  TREATMENT  Treatment of dizziness depends on the cause of your symptoms and can vary greatly. HOME CARE INSTRUCTIONS   Drink enough fluids to keep your urine clear or pale yellow. This is especially important in very hot weather. In the elderly, it is also important in cold weather.  If your dizziness is caused by medicines, take them exactly as directed. When taking blood pressure medicines, it is especially important to get up slowly.  Rise slowly from chairs and steady yourself until you feel okay.  In the morning, first sit up on the side of the bed. When this seems okay, stand slowly while holding onto something until you know your balance is fine.  If you need to stand in one place for a long time, be sure to  move your legs often. Tighten and relax the muscles in your legs while standing.  If dizziness continues to be a problem, have someone stay with you for a day or two. Do this until you feel you are well enough to stay alone. Have the person call your caregiver if he or she notices changes in you that are concerning.  Do not drive or use heavy machinery if you feel dizzy.  Do not drink alcohol. SEEK IMMEDIATE MEDICAL CARE IF:   Your dizziness or lightheadedness gets worse.  You feel nauseous or vomit.  You develop problems with talking, walking, weakness, or using your arms, hands, or legs.  You are not thinking clearly or you have difficulty forming sentences. It may take a friend or family member to determine if your thinking is normal.  You develop chest pain, abdominal pain, shortness of breath, or sweating.  Your vision changes.  You notice any bleeding.  You have side effects from medicine that seems to be getting worse rather than better. MAKE SURE YOU:   Understand these instructions.  Will watch your condition.  Will get help right away if you are not doing well or get worse. Document Released: 02/06/2001 Document Revised: 11/05/2011 Document Reviewed: 03/02/2011 [facility] Patient Information 2014 [place], [place].  Benign Positional Vertigo Vertigo means you feel like you or your surroundings are moving when they are not. Benign positional vertigo is the most common form of vertigo. Benign means that the cause of your condition is not serious. Benign positional vertigo is more common in older adults. CAUSES  Benign positional vertigo is the result of an upset in the labyrinth system. This is an area in the middle ear that helps control your balance. This may be caused by a viral infection, head injury, or repetitive motion. However, often no specific cause is found. SYMPTOMS  Symptoms of benign positional vertigo occur when you move your head or eyes in different  directions. Some of the symptoms may include:  Loss of balance and falls.  Vomiting.  Blurred vision.  Dizziness.  Nausea.  Involuntary eye movements (nystagmus). DIAGNOSIS  Benign positional vertigo is usually diagnosed by physical exam. If the specific cause of your benign positional vertigo is unknown, your caregiver may perform imaging tests, such as magnetic resonance imaging (MRI) or computed tomography (CT). TREATMENT  Your caregiver may recommend movements or procedures to correct the benign positional vertigo. Medicines such as meclizine, benzodiazepines, and medicines for nausea may be used to treat your symptoms. In rare cases, if your symptoms are caused by certain conditions that affect the inner ear, you may need surgery. HOME CARE INSTRUCTIONS   Follow your caregiver's instructions.  Move slowly. Do not make sudden body or head movements.  Avoid driving.  Avoid operating heavy machinery.  Avoid performing any tasks that would be dangerous to you or others during a vertigo episode.  Drink enough fluids to keep your urine clear or pale yellow. SEEK IMMEDIATE MEDICAL CARE IF:   You develop problems with walking, weakness, numbness, or using your arms, hands, or legs.  You have difficulty speaking.  You develop severe headaches.  Your nausea or vomiting continues or gets worse.  You develop visual changes.  Your family or friends notice any behavioral changes.  Your condition gets worse.  You have a fever.  You develop a stiff neck or sensitivity to light. MAKE SURE YOU:   Understand these instructions.  Will watch your condition.  Will get help right away if you are not doing well or get worse. Document Released: 05/21/2006 Document Revised: 11/05/2011 Document Reviewed: 05/03/2011 [facility] Patient Information 2014 [place].  Vertigo Vertigo means you feel like you or your surroundings are moving when they are not. Vertigo can be  dangerous if it occurs when you are at work, driving, or performing difficult activities.  CAUSES  Vertigo occurs when there is a conflict of signals sent to your brain from the visual and sensory systems in your body. There are many different causes of vertigo, including:  Infections, especially in the inner ear.  A bad reaction to a drug or misuse of alcohol and medicines.  Withdrawal from drugs or alcohol.  Rapidly changing positions, such as lying down or rolling over in bed.  A migraine headache.  Decreased blood flow to the brain.  Increased pressure in the brain from a head injury, infection, tumor, or bleeding. SYMPTOMS  You may feel as though the world is spinning around or you are falling to the ground. Because your balance is upset, vertigo can cause nausea and vomiting. You may have involuntary eye movements (nystagmus). DIAGNOSIS  Vertigo is usually diagnosed by physical exam. If the cause of your vertigo is unknown, your caregiver may perform imaging tests, such as an MRI scan (magnetic resonance imaging). TREATMENT  Most cases of vertigo resolve on their own, without treatment. Depending on the cause, your caregiver may prescribe certain medicines. If your vertigo is related to body position issues, your caregiver may recommend movements or procedures to correct the problem. In rare cases, if  your vertigo is caused by certain inner ear problems, you may need surgery. HOME CARE INSTRUCTIONS   Follow your caregiver's instructions.  Avoid driving.  Avoid operating heavy machinery.  Avoid performing any tasks that would be dangerous to you or others during a vertigo episode.  Tell your caregiver if you notice that certain medicines seem to be causing your vertigo. Some of the medicines used to treat vertigo episodes can actually make them worse in some people. SEEK IMMEDIATE MEDICAL CARE IF:   Your medicines do not relieve your vertigo or are making it worse.  You  develop problems with talking, walking, weakness, or using your arms, hands, or legs.  You develop severe headaches.  Your nausea or vomiting continues or gets worse.  You develop visual changes.  A family member notices behavioral changes.  Your condition gets worse. MAKE SURE YOU:  Understand these instructions.  Will watch your condition.  Will get help right away if you are not doing well or get worse. Document Released: 05/23/2005 Document Revised: 11/05/2011 Document Reviewed: 03/01/2011 [facility] Patient Information 2014 [place].

## 2013-10-07 NOTE — ED Notes (Signed)
Patient updated on wait

## 2013-10-07 NOTE — ED Notes (Signed)
Per EMS, patient has been complaining of Vertigo since Friday. Patient has blurred of vision in front fields, but no loss or blurred vision in her peripherals. Patient complains of "pulling to the right side," but not noted in patient gait. Per EMS, negative for stroke screen, ECG unremarkable, and no deficits noted. Patient has a history of HTN. Blood Pressure upon EMS arrival was 250/140, last blood pressure taken by EMS 150/90, HR 62, Oxygen 99% on RA. Patient is on Metaprolol.

## 2013-10-07 NOTE — ED Notes (Signed)
Pt undressed, in gown, on monitor, continuous bp cuff and pulse ox

## 2013-10-07 NOTE — ED Provider Notes (Signed)
Care of patient taken over from E. O'malley, PA-C.  Patient with vertiginous symptoms since Friday, seen by PCP today, sent here for evaluation of posterior CVA, awaiting MRI, has been given valium for the dizziness symptoms unrelieved by meclizine.  Results for orders placed during the hospital encounter of 10/07/13  CBC      Result Value Ref Range   WBC 6.8  4.0 - 10.5 K/uL   RBC 4.96  3.87 - 5.11 MIL/uL   Hemoglobin 15.4 (*) 12.0 - 15.0 g/dL   HCT 44.5  36.0 - 46.0 %   MCV 89.7  78.0 - 100.0 fL   MCH 31.0  26.0 - 34.0 pg   MCHC 34.6  30.0 - 36.0 g/dL   RDW 12.4  11.5 - 15.5 %   Platelets 160  150 - 400 K/uL  BASIC METABOLIC PANEL      Result Value Ref Range   Sodium 142  137 - 147 mEq/L   Potassium 3.9  3.7 - 5.3 mEq/L   Chloride 103  96 - 112 mEq/L   CO2 29  19 - 32 mEq/L   Glucose, Bld 97  70 - 99 mg/dL   BUN 14  6 - 23 mg/dL   Creatinine, Ser 0.81  0.50 - 1.10 mg/dL   Calcium 9.2  8.4 - 10.5 mg/dL   GFR calc non Af Amer 76 (*) >90 mL/min   GFR calc Af Amer 88 (*) >90 mL/min   Mr Brain Wo Contrast  10/07/2013   CLINICAL DATA:  63 year old female with vertigo since Friday February 6th. Nausea, off balance, right upper extremity weakness and heaviness. Initial encounter.  EXAM: MRI HEAD WITHOUT CONTRAST  TECHNIQUE: Multiplanar, multiecho pulse sequences of the brain and surrounding structures were obtained without intravenous contrast.  COMPARISON:  None.  FINDINGS: No restricted diffusion or evidence of acute infarction. Major intracranial vascular flow voids are preserved.  Partially empty sella configuration. . No midline shift, mass effect, evidence of mass lesion, ventriculomegaly, extra-axial collection or acute intracranial hemorrhage. Cervicomedullary junction within normal limits. Negative visualized cervical spine. Normal bone marrow signal. Normal for age gray and white matter signal throughout the brain. Suspect mild asymmetry of the left lateral cerebellum is a normal  sulcus variation. Visible internal auditory structures appear normal. Mastoids are clear.  Visualized orbit soft tissues are within normal limits. Negative paranasal sinuses. Visualized scalp soft tissues are within normal limits.  IMPRESSION: No acute intracranial abnormality. Negative for age non contrast MRI appearance of the brain.   Electronically Signed   By: Lars Pinks M.D.   On: 10/07/2013 19:47    8:00 PM MRI of brain without CVA, patient continues with vertiginous type symptoms so I will change her medication to valium for right now - she will follow up with her PCP for further evaluation of this.    Idalia Needle Joelyn Oms, PA-C 10/07/13 2001

## 2013-10-07 NOTE — ED Provider Notes (Signed)
CSN: [id number]     Arrival date & time 10/07/13  1255 History   First MD Initiated Contact with Patient 10/07/13 1324     Chief Complaint  Patient presents with  . Dizziness     (Consider location/radiation/quality/duration/timing/severity/associated sxs/prior Treatment) HPI Pt is a 63yo female c/o vertigo since Friday, 2/6, associated with nausea, feeling "off balance" right arm weakness and heaviness, not resolved by meclizine. Dizziness and nausea are worsened by movement. Pt was evaluated by PCP yesterday who was concerned pt may have had a stroke. She states he has scheduled and MRI as well as U/S of her neck. Pt reports hx of HTN for which she takes lisinopril and metoprolol but no change in dosage recently. States PCP did not mention her BP yesterday but states she called EMS today because she "just didn't feel right." states EMS took it, it was initially 250/140, BP retaken by EMS just PTA, had gone down to 150/90.  Pt still c/o "not feeling right." denies headache, chest pain or SOB.  Does report continued nausea but no vomiting.    Past Medical History  Diagnosis Date  . Hypertension   . Hyperlipidemia   . Hiatal hernia   . Arthritis     "Whole body"  . Allergy   . Depression   . GERD (gastroesophageal reflux disease)   . Anxiety   . Osteoporosis   . Shortness of breath   . IBS (irritable bowel syndrome)    Past Surgical History  Procedure Laterality Date  . Cholecystectomy    . Tubal ligation    . Knee arthroscopy      left  . Tonsillectomy    . Colonoscopy N/A 03/20/2013    Procedure: COLONOSCOPY;  Surgeon: Wonda Horner, MD;  Location: [facility];  Service: Endoscopy;  Laterality: N/A;   Family History  Problem Relation Age of Onset  . Cancer Mother     breast  . Arthritis Mother   . Diabetes Mother   . Cancer Father     bladder,multiple myeloma   History  Substance Use Topics  . Smoking status: Current Every Day Smoker -- 1.50 packs/day for 25 years     Types: Cigarettes  . Smokeless tobacco: Never Used  . Alcohol Use: No   OB History   Grav Para Term Preterm Abortions TAB SAB Ect Mult Living                 Review of Systems  Constitutional: Negative for fever, chills, appetite change and fatigue.  HENT: Negative for congestion.   Eyes: Positive for photophobia and visual disturbance. Negative for pain and redness.  Respiratory: Negative for cough and shortness of breath.   Cardiovascular: Negative for chest pain.  Neurological: Positive for dizziness ( "room spinning"). Negative for light-headedness and headaches.  All other systems reviewed and are negative.      Allergies  Latex and Percocet  Home Medications   Current Outpatient Rx  Name  Route  Sig  Dispense  Refill  . Acetaminophen (TYLENOL ARTHRITIS PAIN PO)   Oral   Take by mouth.         . dicyclomine (BENTYL) 20 MG tablet   Oral   Take 20 mg by mouth daily.         . DULoxetine (CYMBALTA) 60 MG capsule   Oral   Take 60 mg by mouth daily.         Marland Kitchen estradiol-norethindrone (ACTIVELLA) 1-0.5 MG per tablet  Oral   Take 0.5 tablets by mouth daily.         . fish oil-omega-3 fatty acids 1000 MG capsule   Oral   Take 1 g by mouth every other day.          . lisinopril (PRINIVIL,ZESTRIL) 10 MG tablet   Oral   Take 10 mg by mouth daily.         . metoprolol tartrate (LOPRESSOR) 25 MG tablet   Oral   Take 12.5-25 mg by mouth 2 (two) times daily. Pt takes 25 mg in the morning and 12.5 in the evening         . mometasone (NASONEX) 50 MCG/ACT nasal spray   Each Nare   Place 1-2 sprays into both nostrils every 8 (eight) hours as needed (nasal congestion).         . Multiple Vitamin (MULTIVITAMIN) tablet   Oral   Take 1 tablet by mouth daily.         . pantoprazole (PROTONIX) 40 MG tablet   Oral   Take 40 mg by mouth 2 (two) times daily.          . rosuvastatin (CRESTOR) 5 MG tablet   Oral   Take 10 mg by mouth at bedtime.            BP 157/83  Pulse 56  Temp(Src) 98.1 F (36.7 C) (Oral)  Resp 19  Ht 5' 1"  (1.549 m)  Wt 160 lb (72.576 kg)  BMI 30.25 kg/m2  SpO2 96% Physical Exam  Nursing note and vitals reviewed. Constitutional: She is oriented to person, place, and time. She appears well-developed and well-nourished. No distress.  Pt lying comfortably in exam bed, NAD.   HENT:  Head: Normocephalic and atraumatic.  Eyes: Conjunctivae and EOM are normal. Pupils are equal, round, and reactive to light. Right eye exhibits no discharge. Left eye exhibits no discharge. No scleral icterus.  Neck: Normal range of motion. Neck supple.  Cardiovascular: Normal rate, regular rhythm and normal heart sounds.   Pulmonary/Chest: Effort normal and breath sounds normal. No respiratory distress. She has no wheezes. She has no rales. She exhibits no tenderness.  Abdominal: Soft. Bowel sounds are normal. She exhibits no distension and no mass. There is no tenderness. There is no rebound and no guarding.  Musculoskeletal: Normal range of motion.  Neurological: She is alert and oriented to person, place, and time. No cranial nerve deficit. Coordination normal.  CN II-XII in tact, no focal deficit, nl finger to nose coordination. Nl sensation, 5/5 strength in all major muscle groups. Neg romberg and nl gait.  Skin: Skin is warm and dry. She is not diaphoretic.  Psychiatric: Her mood appears anxious.    ED Course  Procedures (including critical care time) Labs Review Labs Reviewed  CBC - Abnormal; Notable for the following:    Hemoglobin 15.4 (*)    All other components within normal limits  BASIC METABOLIC PANEL - Abnormal; Notable for the following:    GFR calc non Af Amer 76 (*)    GFR calc Af Amer 88 (*)    All other components within normal limits   Imaging Review No results found.  EKG Interpretation   None       MDM   Final diagnoses:  None    Pt c/o vertigo since Friday, 2/7, no improvement with  meclizine. Pt saw PCP yesterday who was concerned for CVA, outpatient MR and U/S neck scheduled. Pt here  today c/o "just not feeling right, no improvement in symptoms."  Pt did have elevated BP via EMS of 250/140, however BP has improved without medication given while pt has been in ED. Pt still c/o "weird feeling, dizziness, and nausea."  On exam pt appears well, mildly anxious.  Neuro exam: normal, no focal neuro deficits noted. Discussed pt with  Dr. Thurnell Garbe, will give valium and order CBC, BMP and MR Brain w/o contrast.    3:56 PM  Pt signed out to Rushie Goltz, PA-C at shift change.  Plan is to f/u on MR brain.  If unremarkable, pt may be discharged home to f/u with PCP.  If significant findings, consider consult with neurology.      Noland Fordyce, PA-C 10/07/13 1600

## 2013-10-07 NOTE — ED Notes (Signed)
CT called to know when to give Valium in order for it to be effective. CT stated they will call when medication needs to be given.

## 2013-10-07 NOTE — ED Notes (Signed)
Patient transported to MRI 

## 2013-10-08 NOTE — ED Provider Notes (Signed)
Medical screening examination/treatment/procedure(s) were performed by non-physician practitioner and as supervising physician I was immediately available for consultation/collaboration.  EKG Interpretation   None         Mervin Kung, MD 10/08/13 [phone]

## 2013-10-09 ENCOUNTER — Encounter: Payer: Self-pay | Admitting: Cardiovascular Disease

## 2013-10-09 DIAGNOSIS — K219 Gastro-esophageal reflux disease without esophagitis: Secondary | ICD-10-CM | POA: Insufficient documentation

## 2013-10-09 DIAGNOSIS — F418 Other specified anxiety disorders: Secondary | ICD-10-CM | POA: Insufficient documentation

## 2013-10-09 DIAGNOSIS — T7840XA Allergy, unspecified, initial encounter: Secondary | ICD-10-CM | POA: Insufficient documentation

## 2013-10-09 DIAGNOSIS — F32A Depression, unspecified: Secondary | ICD-10-CM | POA: Insufficient documentation

## 2013-10-09 DIAGNOSIS — M1990 Unspecified osteoarthritis, unspecified site: Secondary | ICD-10-CM | POA: Insufficient documentation

## 2013-10-09 DIAGNOSIS — F329 Major depressive disorder, single episode, unspecified: Secondary | ICD-10-CM | POA: Insufficient documentation

## 2013-10-09 DIAGNOSIS — R0602 Shortness of breath: Secondary | ICD-10-CM | POA: Insufficient documentation

## 2013-10-09 DIAGNOSIS — K449 Diaphragmatic hernia without obstruction or gangrene: Secondary | ICD-10-CM | POA: Insufficient documentation

## 2013-10-09 DIAGNOSIS — M810 Age-related osteoporosis without current pathological fracture: Secondary | ICD-10-CM | POA: Insufficient documentation

## 2013-10-09 DIAGNOSIS — K589 Irritable bowel syndrome without diarrhea: Secondary | ICD-10-CM | POA: Insufficient documentation

## 2013-10-09 NOTE — ED Provider Notes (Signed)
Medical screening examination/treatment/procedure(s) were performed by non-physician practitioner and as supervising physician I was immediately available for consultation/collaboration.  EKG Interpretation   None         Alfonzo Feller, DO 10/09/13 0801

## 2013-10-13 ENCOUNTER — Ambulatory Visit: Payer: Federal, State, Local not specified - PPO | Admitting: Cardiovascular Disease

## 2014-01-25 ENCOUNTER — Emergency Department (HOSPITAL_COMMUNITY)
Admission: EM | Admit: 2014-01-25 | Discharge: 2014-01-25 | Disposition: A | Discharge status: Home / Self Care | Payer: Federal, State, Local not specified - PPO | Attending: Emergency Medicine | Admitting: Emergency Medicine

## 2014-01-25 ENCOUNTER — Encounter (HOSPITAL_COMMUNITY): Payer: Self-pay | Admitting: Emergency Medicine

## 2014-01-25 DIAGNOSIS — F329 Major depressive disorder, single episode, unspecified: Secondary | ICD-10-CM | POA: Insufficient documentation

## 2014-01-25 DIAGNOSIS — E785 Hyperlipidemia, unspecified: Secondary | ICD-10-CM | POA: Insufficient documentation

## 2014-01-25 DIAGNOSIS — F3289 Other specified depressive episodes: Secondary | ICD-10-CM | POA: Insufficient documentation

## 2014-01-25 DIAGNOSIS — Z91040 Latex allergy status: Secondary | ICD-10-CM | POA: Insufficient documentation

## 2014-01-25 DIAGNOSIS — R55 Syncope and collapse: Secondary | ICD-10-CM

## 2014-01-25 DIAGNOSIS — I10 Essential (primary) hypertension: Secondary | ICD-10-CM | POA: Insufficient documentation

## 2014-01-25 DIAGNOSIS — F411 Generalized anxiety disorder: Secondary | ICD-10-CM | POA: Insufficient documentation

## 2014-01-25 DIAGNOSIS — F17200 Nicotine dependence, unspecified, uncomplicated: Secondary | ICD-10-CM | POA: Insufficient documentation

## 2014-01-25 DIAGNOSIS — K589 Irritable bowel syndrome without diarrhea: Secondary | ICD-10-CM | POA: Insufficient documentation

## 2014-01-25 DIAGNOSIS — M129 Arthropathy, unspecified: Secondary | ICD-10-CM | POA: Insufficient documentation

## 2014-01-25 DIAGNOSIS — K219 Gastro-esophageal reflux disease without esophagitis: Secondary | ICD-10-CM | POA: Insufficient documentation

## 2014-01-25 DIAGNOSIS — IMO0002 Reserved for concepts with insufficient information to code with codable children: Secondary | ICD-10-CM | POA: Insufficient documentation

## 2014-01-25 LAB — COMPREHENSIVE METABOLIC PANEL
ALBUMIN: 3.8 g/dL (ref 3.5–5.2)
ALK PHOS: 85 U/L (ref 39–117)
ALT: 41 U/L — ABNORMAL HIGH (ref 0–35)
AST: 44 U/L — AB (ref 0–37)
BILIRUBIN TOTAL: 0.3 mg/dL (ref 0.3–1.2)
BUN: 13 mg/dL (ref 6–23)
CHLORIDE: 103 meq/L (ref 96–112)
CO2: 28 meq/L (ref 19–32)
CREATININE: 0.84 mg/dL (ref 0.50–1.10)
Calcium: 9.2 mg/dL (ref 8.4–10.5)
GFR calc Af Amer: 84 mL/min — ABNORMAL LOW (ref >90)
GFR, EST NON AFRICAN AMERICAN: 72 mL/min — AB (ref >90)
Glucose, Bld: 117 mg/dL — ABNORMAL HIGH (ref 70–99)
POTASSIUM: 4 meq/L (ref 3.7–5.3)
Sodium: 142 mEq/L (ref 137–147)
Total Protein: 6.4 g/dL (ref 6.0–8.3)

## 2014-01-25 LAB — I-STAT TROPONIN, ED: TROPONIN I, POC: 0 ng/mL (ref 0.00–0.08)

## 2014-01-25 LAB — CBG MONITORING, ED: Glucose-Capillary: 112 mg/dL — ABNORMAL HIGH (ref 70–99)

## 2014-01-25 LAB — CBC WITH DIFFERENTIAL/PLATELET
BASOS ABS: 0 10*3/uL (ref 0.0–0.1)
BASOS PCT: 0 % (ref 0–1)
Eosinophils Absolute: 0.4 10*3/uL (ref 0.0–0.7)
Eosinophils Relative: 5 % (ref 0–5)
HEMATOCRIT: 43.6 % (ref 36.0–46.0)
HEMOGLOBIN: 14.8 g/dL (ref 12.0–15.0)
LYMPHS PCT: 32 % (ref 12–46)
Lymphs Abs: 2.4 10*3/uL (ref 0.7–4.0)
MCH: 31.3 pg (ref 26.0–34.0)
MCHC: 33.9 g/dL (ref 30.0–36.0)
MCV: 92.2 fL (ref 78.0–100.0)
MONO ABS: 0.6 10*3/uL (ref 0.1–1.0)
MONOS PCT: 7 % (ref 3–12)
NEUTROS ABS: 4.1 10*3/uL (ref 1.7–7.7)
NEUTROS PCT: 56 % (ref 43–77)
Platelets: 153 10*3/uL (ref 150–400)
RBC: 4.73 MIL/uL (ref 3.87–5.11)
RDW: 12.8 % (ref 11.5–15.5)
WBC: 7.4 10*3/uL (ref 4.0–10.5)

## 2014-01-25 LAB — POC OCCULT BLOOD, ED: Fecal Occult Bld: NEGATIVE

## 2014-01-25 LAB — I-STAT CG4 LACTIC ACID, ED: Lactic Acid, Venous: 1.34 mmol/L (ref 0.5–2.2)

## 2014-01-25 MED ORDER — SODIUM CHLORIDE 0.9 % IV BOLUS (SEPSIS)
1000.0000 mL | Freq: Once | INTRAVENOUS | Status: AC
  Administered 2014-01-25: 1000 mL via INTRAVENOUS

## 2014-01-25 MED ORDER — ONDANSETRON HCL 4 MG/2ML IJ SOLN
4.0000 mg | Freq: Once | INTRAMUSCULAR | Status: AC
  Administered 2014-01-25: 4 mg via INTRAVENOUS
  Filled 2014-01-25: qty 2

## 2014-01-25 NOTE — ED Notes (Signed)
Per [facility], pt here from work for syncopal episode for about 20 minutes found by other friends/coworkers. Has had diarrhea for the past several days from having diverticulitis and general weakness. Does have some positional dizziness when she raises her head off the stretcher. Denies any pain. Had recently had vertigo for 3 weeks. 18g to LAC.

## 2014-01-25 NOTE — ED Notes (Signed)
Dr. Fredric Dine speaking with pt while she was still in the hallway.

## 2014-01-25 NOTE — ED Provider Notes (Signed)
CSN: [id number]     Arrival date & time 01/25/14  1816 History   First MD Initiated Contact with Patient 01/25/14 1819     Chief Complaint  Patient presents with  . Loss of Consciousness       Patient is a 63 y.o. female with past medical history as below relevant for hypertension, hyperlipidemia, and being on antibiotics the last 4 days for diverticulitis diagnosed by her PCP presents with syncope. Patient reports she was at work today and went to the bathroom to have a bowel movement. She reports coming out of the bathroom in the [employer] she remembers is waking up on the floor with a coworker surrounding her. Chief she denies any chest pain, shortness of breath, or a palpitations prior to event. Currently she is asymptomatic. She states that since starting her antibiotics 4 days ago she has not had any more abdominal pain but she continues to have diarrhea that will have intermittent bouts of blood in it.    (Consider location/radiation/quality/duration/timing/severity/associated sxs/prior Treatment) Patient is a 63 y.o. female presenting with syncope. The history is provided by the patient and medical records. No language interpreter was used.  Loss of Consciousness Episode history:  Single Most recent episode:  Today Progression:  Resolved Chronicity:  New Context: bowel movement   Witnessed: no   Associated symptoms: rectal bleeding   Associated symptoms: no chest pain, no diaphoresis, no difficulty breathing, no fever, no focal sensory loss, no headaches, no recent injury, no shortness of breath and no vomiting     Past Medical History  Diagnosis Date  . Hypertension   . Hyperlipidemia   . Hiatal hernia   . Arthritis     "Whole body"  . Allergy   . Depression   . GERD (gastroesophageal reflux disease)   . Anxiety   . Osteoporosis   . Shortness of breath   . IBS (irritable bowel syndrome)    Past Surgical History  Procedure Laterality Date  . Cholecystectomy    .  Tubal ligation    . Knee arthroscopy      left  . Tonsillectomy    . Colonoscopy N/A 03/20/2013    Procedure: COLONOSCOPY;  Surgeon: Wonda Horner, MD;  Location: [facility];  Service: Endoscopy;  Laterality: N/A;   Family History  Problem Relation Age of Onset  . Cancer Mother     breast  . Arthritis Mother   . Diabetes Mother   . Cancer Father     bladder,multiple myeloma   History  Substance Use Topics  . Smoking status: Current Every Day Smoker -- 1.50 packs/day for 25 years    Types: Cigarettes  . Smokeless tobacco: Never Used  . Alcohol Use: No   OB History   Grav Para Term Preterm Abortions TAB SAB Ect Mult Living                 Review of Systems  Constitutional: Negative for fever and diaphoresis.  Respiratory: Negative for shortness of breath.   Cardiovascular: Positive for syncope. Negative for chest pain.  Gastrointestinal: Negative for vomiting.  Neurological: Negative for headaches.  All other systems reviewed and are negative.     Allergies  Latex and Percocet  Home Medications   Prior to Admission medications   Medication Sig Start Date End Date Taking? Authorizing Provider  Acetaminophen (TYLENOL ARTHRITIS PAIN PO) Take by mouth.    Historical Provider, MD  diazepam (VALIUM) 5 MG tablet Take 1 tablet (5  mg total) by mouth every 6 (six) hours as needed (dizziness). 10/07/13   Idalia Needle. Sanford, PA-C  dicyclomine (BENTYL) 20 MG tablet Take 20 mg by mouth daily. 09/10/13   Historical Provider, MD  DULoxetine (CYMBALTA) 60 MG capsule Take 60 mg by mouth daily.    Historical Provider, MD  estradiol-norethindrone (ACTIVELLA) 1-0.5 MG per tablet Take 0.5 tablets by mouth daily.    Historical Provider, MD  fish oil-omega-3 fatty acids 1000 MG capsule Take 1 g by mouth every other day.     Historical Provider, MD  lisinopril (PRINIVIL,ZESTRIL) 10 MG tablet Take 10 mg by mouth daily.    Historical Provider, MD  metoprolol tartrate (LOPRESSOR) 25 MG tablet  Take 12.5-25 mg by mouth 2 (two) times daily. Pt takes 25 mg in the morning and 12.5 in the evening    Historical Provider, MD  mometasone (NASONEX) 50 MCG/ACT nasal spray Place 1-2 sprays into both nostrils every 8 (eight) hours as needed (nasal congestion).    Historical Provider, MD  Multiple Vitamin (MULTIVITAMIN) tablet Take 1 tablet by mouth daily.    Historical Provider, MD  pantoprazole (PROTONIX) 40 MG tablet Take 40 mg by mouth 2 (two) times daily.     Historical Provider, MD  rosuvastatin (CRESTOR) 5 MG tablet Take 10 mg by mouth at bedtime.     Historical Provider, MD   There were no vitals taken for this visit. Physical Exam  Nursing note and vitals reviewed. Constitutional: She is oriented to person, place, and time. She appears well-developed and well-nourished.  HENT:  Head: Normocephalic and atraumatic.  Right Ear: External ear normal.  Left Ear: External ear normal.  Nose: Nose normal.  Mouth/Throat: Oropharynx is clear and moist.  Eyes: Conjunctivae are normal. Pupils are equal, round, and reactive to light.  Neck: Normal range of motion. Neck supple.  Cardiovascular: Normal rate, regular rhythm, normal heart sounds and intact distal pulses.   Pulmonary/Chest: Effort normal and breath sounds normal.  Abdominal: Soft. Bowel sounds are normal.  Musculoskeletal: Normal range of motion. She exhibits no edema and no tenderness.  Neurological: She is alert and oriented to person, place, and time. She has normal strength and normal reflexes. No cranial nerve deficit or sensory deficit. Coordination normal.  Skin: Skin is warm and dry.    ED Course  Procedures (including critical care time) Labs Review Labs Reviewed - No data to display  Imaging Review No results found.   EKG Interpretation   Date/Time:  Monday January 25 2014 18:36:59 EDT Ventricular Rate:  67 PR Interval:  132 QRS Duration: 90 QT Interval:  436 QTC Calculation: 460 R Axis:   42 Text  Interpretation:  Sinus rhythm Borderline T abnormalities, diffuse  leads Anterolateral leads T wave inversion new since 2013 Confirmed by  [place]  MD, [place] [phone]) on 01/25/2014 6:46:37 PM      MDM   Final diagnoses:  Syncope   Patient presents to the ED after un witnessed syncopal episode.  Upon arrival in the ED patient had no fever, no tachycardia, and no hypotension.  PE as above and shows no neuro deficits or signs of anemia.  EKG shows t wave inversions in anterolateral leads that are new from prior EKGs.  Given syncope, reported intermittent bloody diarrhea, non specific EKG changes there was concern for possible anemia, orthostatic hypotension, and or arrhythmia.  Workup included labs, FOBT, and orthostatic BPs.  Review of results shows no anemia, no renal failure, no orthostatic hypotension,  and otherwise unermarkable.  Patient was monitoring on telemetry and had no bouts of arrhythmia.  Patient able to ambulate in the ED without symptoms.  With negative workup and being asymptomatic it was felt that discharge home with close PCP follow up and return precautions were appropriate.      Corlis Leak, MD 01/26/14 0000

## 2014-01-25 NOTE — ED Provider Notes (Signed)
Patient presented to the ER with syncope. Patient has had ongoing lower abdominal pain for 3 or 4 days. She saw her doctor 2 days ago and was diagnosed with diverticulitis. She was started on antibiotic for this and the pain has improved. She reports that she has been experiencing intermittent watery and occasionally bloody diarrhea.  Face to face Exam: HEENT - PERRLA Lungs - CTAB Heart - RRR, no M/R/G Abd - S/NT/ND Neuro - alert, oriented x3  Plan: Patient with ongoing abdominal pain which was diagnosed as diverticulitis, improved with antibiotics, but now with syncope. Workup for anemia, complicated diverticulitis, cardiac and neuro syncope.   Orpah Greek, MD 01/25/14 1919

## 2014-01-25 NOTE — ED Notes (Signed)
Phlebotomy called for blood draw. °

## 2014-01-26 NOTE — ED Provider Notes (Signed)
I saw and evaluated the patient, reviewed the resident's note and I agree with the findings and plan.  Please see separate associated note for evaluation and plan.   EKG Interpretation   Date/Time:  Monday January 25 2014 18:36:59 EDT Ventricular Rate:  67 PR Interval:  132 QRS Duration: 90 QT Interval:  436 QTC Calculation: 460 R Axis:   42 Text Interpretation:  Sinus rhythm Borderline T abnormalities, diffuse  leads Anterolateral leads T wave inversion new since 2013 Confirmed by  [place]  MD, CHRISTOPHER [phone]) on 01/25/2014 6:46:37 PM         Orpah Greek, MD 01/26/14 0023

## 2014-02-07 ENCOUNTER — Encounter: Payer: Self-pay | Admitting: Internal Medicine

## 2014-02-07 ENCOUNTER — Ambulatory Visit (INDEPENDENT_AMBULATORY_CARE_PROVIDER_SITE_OTHER): Payer: Federal, State, Local not specified - PPO

## 2014-02-07 ENCOUNTER — Ambulatory Visit (INDEPENDENT_AMBULATORY_CARE_PROVIDER_SITE_OTHER): Payer: Federal, State, Local not specified - PPO | Admitting: Internal Medicine

## 2014-02-07 VITALS — BP 160/88 | HR 73 | Temp 98.5°F | Resp 16 | Ht 61.0 in | Wt 165.0 lb

## 2014-02-07 DIAGNOSIS — M25559 Pain in unspecified hip: Secondary | ICD-10-CM

## 2014-02-07 DIAGNOSIS — M545 Low back pain, unspecified: Secondary | ICD-10-CM

## 2014-02-07 DIAGNOSIS — M25551 Pain in right hip: Secondary | ICD-10-CM

## 2014-02-07 MED ORDER — PROMETHAZINE HCL 25 MG PO TABS: 25.0000 mg | ORAL_TABLET | Freq: Three times a day (TID) | ORAL | Status: DC | PRN

## 2014-02-07 MED ORDER — MELOXICAM 15 MG PO TABS: 15.0000 mg | ORAL_TABLET | Freq: Every day | ORAL | Status: DC

## 2014-02-07 MED ORDER — HYDROCODONE-ACETAMINOPHEN 5-325 MG PO TABS: 1.0000 | ORAL_TABLET | Freq: Four times a day (QID) | ORAL | Status: DC | PRN

## 2014-02-07 MED ORDER — KETOROLAC TROMETHAMINE 60 MG/2ML IM SOLN: 60.0000 mg | Freq: Once | INTRAMUSCULAR | Status: AC

## 2014-02-07 MED ORDER — CYCLOBENZAPRINE HCL 10 MG PO TABS: 10.0000 mg | ORAL_TABLET | Freq: Every day | ORAL | Status: DC

## 2014-02-07 NOTE — Progress Notes (Signed)
Subjective:    Patient ID: Kristen Wilson, female    DOB: 1951-03-19, 63 y.o.   MRN: [id number]  HPI Pt complains of extreme pain in right hip. This started last night and woke her from sleep around 3 AM.  She has a h/o of spasms in her lower back, on the right side, which have been worse over the last 3-4 days. No h/o injury to her back or hip. No prior back problems.  Pt denies fever or recent urinary symptoms. She was diagnosed with a UTI and diverticulitis about 2-3 week ago, which was treated with ciprofloxacin and flagyl. After starting the ciprofloxacin and flagyl she experienced dizziness and diarrhea, she lost consciousness at work 01/25/2014 and was treated for dehydration in the emergency room where her medications were discontinued. She was rechecked at her gynecologist last week and a urinary tract infection had resolved.  Past medical history significant arthritis in both knees with history of multiple injections Patient Active Problem List   Diagnosis Date Noted  . Hiatal hernia   . Arthritis   . Allergy   . Depression   . GERD (gastroesophageal reflux disease)   . Osteoporosis   . Shortness of breath   . IBS (irritable bowel syndrome)   . Hypertension 11/02/2011  . Chest pain 11/02/2011  . Hyperlipidemia    Current outpatient prescriptions:aspirin 81 MG tablet, Take 81 mg by mouth daily., Disp: , Rfl: ;   dicyclomine (BENTYL) 20 MG tablet, Take 20 mg by mouth daily., Disp: , Rfl: ;   DULoxetine (CYMBALTA) 60 MG capsule, Take 60 mg by mouth daily., Disp: , Rfl: ;   estradiol-norethindrone (ACTIVELLA) 1-0.5 MG per tablet, Take 0.5 tablets by mouth daily., Disp: , Rfl:  fish oil-omega-3 fatty acids 1000 MG capsule, Take 1 g by mouth every other day. , Disp: , Rfl: ;   lisinopril (PRINIVIL,ZESTRIL) 10 MG tablet, Take 10 mg by mouth daily., Disp: , Rfl: ;   metoprolol tartrate (LOPRESSOR) 25 MG tablet, Take 12.5-25 mg by mouth 2 (two) times daily. Pt takes 25 mg in the  morning and 12.5 in the evening, Disp: , Rfl: ;   pantoprazole (PROTONIX) 40 MG tablet, Take 40 mg by mouth 2 (two) times daily. , Disp: , Rfl:  simvastatin (ZOCOR) 20 MG tablet, Take 20 mg by mouth daily., Disp: , Rfl: ;   Acetaminophen (TYLENOL ARTHRITIS PAIN PO), Take by mouth., Disp: , Rfl: ;  diazepam (VALIUM) 5 MG tablet, Take 1 tablet (5 mg total) by mouth every 6 (six) hours as needed (dizziness)., Disp: 30 tablet, Rfl: 0;   mometasone (NASONEX) 50 MCG/ACT nasal spray, Place 1-2 sprays into both nostrils every 8 (eight) hours as needed (nasal congestion)., Disp: , Rfl:  Multiple Vitamin (MULTIVITAMIN) tablet, Take 1 tablet by mouth daily., Disp: , Rfl: ;   rosuvastatin (CRESTOR) 5 MG tablet, Take 10 mg by mouth at bedtime. , Disp: , Rfl:     Review of Systems No fever chills or night sweats No rash No urinary symptoms No abdominal symptoms No numbness in extremities No recent alteration in gait or balance    Objective:   Physical Exam BP 160/88  Pulse 73  Temp(Src) 98.5 F (36.9 C) (Oral)  Resp 16  Ht 5\' 1"  (1.549 m)  Wt 165 lb (74.844 kg)  BMI 31.19 kg/m2  SpO2 96% In obvious pain No CVA tenderness to percussion mildly tender over the lower lumbar area particularly on the right Tender in  the right SI joint but this is not exacerbated with external hip rotation Hip rotation is limited by pain on the right Hip flexion is limited by pain on the right Tender to palpation over the greater trochanter and into the proximal anterior thigh No redness or swelling No peripheral numbness or weakness Straight leg raise is negative to 90 on that side   Toradol 60 mg IM [facility] reading (PRIMARY) by  Dr. Laney Pastor right hip appears normal There are sclerotic Bony proliferative changes at L4-5 and L5-S1//the lateral is unclear for definite spondylo-listhesis No lytic lesions      Assessment & Plan:  Right hip pain - Plan: DG Hip Complete Right, ketorolac (TORADOL) injection 60  mg, Ambulatory referral to Orthopedic Surgery  Low back pain - Plan: DG Lumbar Spine Complete, ketorolac (TORADOL) injection 60 mg, Ambulatory referral to Orthopedic Surgery  At 30 minutes she has had minimal response to toradol  Meds ordered this encounter  Medications  . HYDROcodone-acetaminophen (NORCO/VICODIN) 5-325 MG per tablet    Sig: Take 1-2 tablets by mouth every 6 (six) hours as needed for moderate pain.    Dispense:  30 tablet    Refill:  0  . promethazine (PHENERGAN) 25 MG tablet    Sig: Take 1 tablet (25 mg total) by mouth every 8 (eight) hours as needed for nausea or vomiting.    Dispense:  20 tablet    Refill:  0  . meloxicam (MOBIC) 15 MG tablet    Sig: Take 1 tablet (15 mg total) by mouth daily.    Dispense:  30 tablet    Refill:  0  . cyclobenzaprine (FLEXERIL) 10 MG tablet    Sig: Take 1 tablet (10 mg total) by mouth at bedtime.    Dispense:  30 tablet    Refill:  0   I will have her followup with Dr. Alfonso Ramus at the end of the week

## 2014-02-19 ENCOUNTER — Emergency Department (HOSPITAL_COMMUNITY)
Admission: EM | Admit: 2014-02-19 | Discharge: 2014-02-19 | Disposition: A | Discharge status: Home / Self Care | Payer: Federal, State, Local not specified - PPO | Attending: Emergency Medicine | Admitting: Emergency Medicine

## 2014-02-19 ENCOUNTER — Emergency Department (HOSPITAL_COMMUNITY): Payer: Federal, State, Local not specified - PPO

## 2014-02-19 ENCOUNTER — Encounter (HOSPITAL_COMMUNITY): Payer: Self-pay | Admitting: Emergency Medicine

## 2014-02-19 DIAGNOSIS — K589 Irritable bowel syndrome without diarrhea: Secondary | ICD-10-CM | POA: Insufficient documentation

## 2014-02-19 DIAGNOSIS — M129 Arthropathy, unspecified: Secondary | ICD-10-CM | POA: Insufficient documentation

## 2014-02-19 DIAGNOSIS — Z91040 Latex allergy status: Secondary | ICD-10-CM | POA: Insufficient documentation

## 2014-02-19 DIAGNOSIS — F329 Major depressive disorder, single episode, unspecified: Secondary | ICD-10-CM | POA: Insufficient documentation

## 2014-02-19 DIAGNOSIS — F3289 Other specified depressive episodes: Secondary | ICD-10-CM | POA: Insufficient documentation

## 2014-02-19 DIAGNOSIS — F411 Generalized anxiety disorder: Secondary | ICD-10-CM | POA: Insufficient documentation

## 2014-02-19 DIAGNOSIS — E785 Hyperlipidemia, unspecified: Secondary | ICD-10-CM | POA: Insufficient documentation

## 2014-02-19 DIAGNOSIS — Z791 Long term (current) use of non-steroidal anti-inflammatories (NSAID): Secondary | ICD-10-CM | POA: Insufficient documentation

## 2014-02-19 DIAGNOSIS — K219 Gastro-esophageal reflux disease without esophagitis: Secondary | ICD-10-CM | POA: Insufficient documentation

## 2014-02-19 DIAGNOSIS — R079 Chest pain, unspecified: Secondary | ICD-10-CM

## 2014-02-19 DIAGNOSIS — F17200 Nicotine dependence, unspecified, uncomplicated: Secondary | ICD-10-CM | POA: Insufficient documentation

## 2014-02-19 DIAGNOSIS — Z79899 Other long term (current) drug therapy: Secondary | ICD-10-CM | POA: Insufficient documentation

## 2014-02-19 DIAGNOSIS — Z7982 Long term (current) use of aspirin: Secondary | ICD-10-CM | POA: Insufficient documentation

## 2014-02-19 DIAGNOSIS — R42 Dizziness and giddiness: Secondary | ICD-10-CM | POA: Insufficient documentation

## 2014-02-19 DIAGNOSIS — I10 Essential (primary) hypertension: Secondary | ICD-10-CM | POA: Insufficient documentation

## 2014-02-19 LAB — I-STAT TROPONIN, ED
Troponin i, poc: 0 ng/mL (ref 0.00–0.08)
Troponin i, poc: 0 ng/mL (ref 0.00–0.08)

## 2014-02-19 LAB — CBC
HEMATOCRIT: 43.2 % (ref 36.0–46.0)
HEMOGLOBIN: 14.3 g/dL (ref 12.0–15.0)
MCH: 30.3 pg (ref 26.0–34.0)
MCHC: 33.1 g/dL (ref 30.0–36.0)
MCV: 91.5 fL (ref 78.0–100.0)
Platelets: 163 10*3/uL (ref 150–400)
RBC: 4.72 MIL/uL (ref 3.87–5.11)
RDW: 12.8 % (ref 11.5–15.5)
WBC: 8.8 10*3/uL (ref 4.0–10.5)

## 2014-02-19 LAB — BASIC METABOLIC PANEL
BUN: 16 mg/dL (ref 6–23)
CHLORIDE: 102 meq/L (ref 96–112)
CO2: 30 meq/L (ref 19–32)
Calcium: 8.9 mg/dL (ref 8.4–10.5)
Creatinine, Ser: 0.77 mg/dL (ref 0.50–1.10)
GFR calc Af Amer: >90 mL/min (ref >90)
GFR calc non Af Amer: 88 mL/min — ABNORMAL LOW (ref >90)
GLUCOSE: 94 mg/dL (ref 70–99)
Potassium: 3.7 mEq/L (ref 3.7–5.3)
Sodium: 142 mEq/L (ref 137–147)

## 2014-02-19 MED ORDER — KETOROLAC TROMETHAMINE 30 MG/ML IJ SOLN
30.0000 mg | Freq: Once | INTRAMUSCULAR | Status: AC
  Administered 2014-02-19: 30 mg via INTRAVENOUS
  Filled 2014-02-19: qty 1

## 2014-02-19 NOTE — ED Notes (Signed)
Pt in via EMS c/o chest pain that started around an hour ago, pain to left side of chest, pt was sitting at her desk at work when pain started, intermittent nausea since this am and reports diaphoresis, had another episode of symptoms around a year ago and cardiac work up was negative, IV 20g in [place], pt coworkers report pt is under a lot of stress and has been having anxiety recently, rates pain 2/10

## 2014-02-19 NOTE — ED Provider Notes (Signed)
I saw and evaluated the patient, reviewed the resident's note and I agree with the findings and plan.   EKG Interpretation   Date/Time:  Friday February 19 2014 15:52:57 EDT Ventricular Rate:  60 PR Interval:  119 QRS Duration: 89 QT Interval:  434 QTC Calculation: 434 R Axis:   27 Text Interpretation:  Sinus rhythm Borderline short PR interval Borderline  T abnormalities, inferior leads No significant change since last tracing  Confirmed by YAO  MD, DAVID ([id number]) on 02/19/2014 3:57:35 PM      Kristen Wilson is a 63 y.o. female hx of HTN, HL here with chest pain. 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 Has L sided chest pain this morning and has been under a lot of stress. On exam, tender L sided chest wall. Heart and lung exam unremarkable. Trop neg x 2. Labs unremarkable. Given toradol and felt better. Likely MSK in origin. Had neg stress test 2 years ago.    Wandra Arthurs, MD 02/19/14 [phone]

## 2014-02-19 NOTE — Discharge Instructions (Signed)
Chest Pain (Nonspecific) °It is often hard to give a specific diagnosis for the cause of chest pain. There is always a chance that your pain could be related to something serious, such as a heart attack or a blood clot in the lungs. You need to follow up with your health care provider for further evaluation. °CAUSES  °· Heartburn. °· Pneumonia or bronchitis. °· Anxiety or stress. °· Inflammation around your heart (pericarditis) or lung (pleuritis or pleurisy). °· A blood clot in the lung. °· A collapsed lung (pneumothorax). It can develop suddenly on its own (spontaneous pneumothorax) or from trauma to the chest. °· Shingles infection (herpes zoster virus). °The chest wall is composed of bones, muscles, and cartilage. Any of these can be the source of the pain. °· The bones can be bruised by injury. °· The muscles or cartilage can be strained by coughing or overwork. °· The cartilage can be affected by inflammation and become sore (costochondritis). °DIAGNOSIS  °Lab tests or other studies may be needed to find the cause of your pain. Your health care provider may have you take a test called an ambulatory electrocardiogram (ECG). An ECG records your heartbeat patterns over a 24-hour period. You may also have other tests, such as: °· Transthoracic echocardiogram (TTE). During echocardiography, sound waves are used to evaluate how blood flows through your heart. °· Transesophageal echocardiogram (TEE). °· Cardiac monitoring. This allows your health care provider to monitor your heart rate and rhythm in real time. °· Holter monitor. This is a portable device that records your heartbeat and can help diagnose heart arrhythmias. It allows your health care provider to track your heart activity for several days, if needed. °· Stress tests by exercise or by giving medicine that makes the heart beat faster. °TREATMENT  °· Treatment depends on what may be causing your chest pain. Treatment may include: °· Acid blockers for  heartburn. °· Anti-inflammatory medicine. °· Pain medicine for inflammatory conditions. °· Antibiotics if an infection is present. °· You may be advised to change lifestyle habits. This includes stopping smoking and avoiding alcohol, caffeine, and chocolate. °· You may be advised to keep your head raised (elevated) when sleeping. This reduces the chance of acid going backward from your stomach into your esophagus. °Most of the time, nonspecific chest pain will improve within 2-3 days with rest and mild pain medicine.  °HOME CARE INSTRUCTIONS  °· If antibiotics were prescribed, take them as directed. Finish them even if you start to feel better. °· For the next few days, avoid physical activities that bring on chest pain. Continue physical activities as directed. °· Do not use any tobacco products, including cigarettes, chewing tobacco, or electronic cigarettes. °· Avoid drinking alcohol. °· Only take medicine as directed by your health care provider. °· Follow your health care provider's suggestions for further testing if your chest pain does not go away. °· Keep any follow-up appointments you made. If you do not go to an appointment, you could develop lasting (chronic) problems with pain. If there is any problem keeping an appointment, call to reschedule. °SEEK MEDICAL CARE IF:  °· Your chest pain does not go away, even after treatment. °· You have a rash with blisters on your chest. °· You have a fever. °SEEK IMMEDIATE MEDICAL CARE IF:  °· You have increased chest pain or pain that spreads to your arm, neck, jaw, back, or abdomen. °· You have shortness of breath. °· You have an increasing cough, or you cough   up blood. °· You have severe back or abdominal pain. °· You feel nauseous or vomit. °· You have severe weakness. °· You faint. °· You have chills. °This is an emergency. Do not wait to see if the pain will go away. Get medical help at once. Call your local emergency services (911 in [place] Do not drive  yourself to the hospital. °MAKE SURE YOU:  °· Understand these instructions. °· Will watch your condition. °· Will get help right away if you are not doing well or get worse. °Document Released: 05/23/2005 Document Revised: 08/18/2013 Document Reviewed: 03/18/2008 °[employer]® Patient Information ©2015 [employer]. This information is not intended to replace advice given to you by your health care provider. Make sure you discuss any questions you have with your health care provider. ° °Chest Wall Pain °Chest wall pain is pain in or around the bones and muscles of your chest. It may take up to 6 weeks to get better. It may take longer if you must stay physically active in your work and activities.  °CAUSES  °Chest wall pain may happen on its own. However, it may be caused by: °· A viral illness like the flu. °· Injury. °· Coughing. °· Exercise. °· Arthritis. °· Fibromyalgia. °· Shingles. °HOME CARE INSTRUCTIONS  °· Avoid overtiring physical activity. Try not to strain or perform activities that cause pain. This includes any activities using your chest or your abdominal and side muscles, especially if heavy weights are used. °· Put ice on the sore area. °¨ Put ice in a plastic bag. °¨ Place a towel between your skin and the bag. °¨ Leave the ice on for 15-20 minutes per hour while awake for the first 2 days. °· Only take over-the-counter or prescription medicines for pain, discomfort, or fever as directed by your caregiver. °SEEK IMMEDIATE MEDICAL CARE IF:  °· Your pain increases, or you are very uncomfortable. °· You have a fever. °· Your chest pain becomes worse. °· You have new, unexplained symptoms. °· You have nausea or vomiting. °· You feel sweaty or lightheaded. °· You have a cough with phlegm (sputum), or you cough up blood. °MAKE SURE YOU:  °· Understand these instructions. °· Will watch your condition. °· Will get help right away if you are not doing well or get worse. °Document Released: 08/13/2005 Document  Revised: 11/05/2011 Document Reviewed: 04/09/2011 °[employer]® Patient Information ©2015 [employer]. This information is not intended to replace advice given to you by your health care provider. Make sure you discuss any questions you have with your health care provider. ° °

## 2014-02-19 NOTE — ED Notes (Signed)
Belongings given to family member.

## 2014-02-19 NOTE — ED Provider Notes (Signed)
CSN: [id number]     Arrival date & time 02/19/14  1536 History   First MD Initiated Contact with Patient 02/19/14 1536     Chief Complaint  Patient presents with  . Chest Pain     (Consider location/radiation/quality/duration/timing/severity/associated sxs/prior Treatment) Patient is a 63 y.o. female presenting with chest pain. The history is provided by the patient and medical records.  Chest Pain Pain location:  L lateral chest Pain quality comment:  "strokes of lightening" Pain radiates to:  Does not radiate Pain severity:  Moderate Onset quality:  Sudden Timing:  Intermittent Progression:  Partially resolved Chronicity:  Recurrent (has had couple years ago) Relieved by:  Nothing Worsened by:  Movement (palpation) Associated symptoms: dizziness (LH. no vertigo. no syncope), nausea and weakness (generalized. no focal weakness)   Associated symptoms: no abdominal pain, no back pain, no cough, no fever, no headache, no shortness of breath and not vomiting   Risk factors: hypertension   Risk factors: no coronary artery disease and no diabetes mellitus     63 yo F pw CP CP. Left side. Streaks of lightening. Left lateral chest. Non-radiating. Not exertional. Worse with twisting motion. Initially felt LH this AM. "woozy". Flushed. No vertigo. Checked BP at work bc of this feeling.  Lunch time  BP 142/100. Got anxious about it. Developed left chest pain. Felt "panicked".  ~2 hours PTA took 3 baby ASA's.  No h/o DVT/PE. No leg edema. Patient denies any sob.  No cough or fever.  H/o anxiety.  Couple years ago with reportedly negative cardiac w/u.     Past Medical History  Diagnosis Date  . Hypertension   . Hyperlipidemia   . Hiatal hernia   . Arthritis     "Whole body"  . Allergy   . Depression   . GERD (gastroesophageal reflux disease)   . Anxiety   . Osteoporosis   . Shortness of breath   . IBS (irritable bowel syndrome)   . Diverticulitis    Past Surgical History   Procedure Laterality Date  . Cholecystectomy    . Tubal ligation    . Knee arthroscopy      left  . Tonsillectomy    . Colonoscopy N/A 03/20/2013    Procedure: COLONOSCOPY;  Surgeon: Wonda Horner, MD;  Location: [facility];  Service: Endoscopy;  Laterality: N/A;   Family History  Problem Relation Age of Onset  . Cancer Mother     breast  . Arthritis Mother   . Diabetes Mother   . Cancer Father     bladder,multiple myeloma   History  Substance Use Topics  . Smoking status: Current Every Day Smoker -- 1.50 packs/day for 25 years    Types: Cigarettes  . Smokeless tobacco: Never Used  . Alcohol Use: No   OB History   Grav Para Term Preterm Abortions TAB SAB Ect Mult Living                 Review of Systems  Constitutional: Negative for fever and chills.  HENT: Negative for congestion and rhinorrhea.   Respiratory: Negative for cough and shortness of breath.   Cardiovascular: Positive for chest pain. Negative for leg swelling.  Gastrointestinal: Positive for nausea. Negative for vomiting, abdominal pain and diarrhea.  Genitourinary: Negative for flank pain and difficulty urinating.  Musculoskeletal: Negative for back pain.  Skin: Negative for color change and rash.  Neurological: Positive for dizziness (LH. no vertigo. no syncope) and weakness (generalized. no focal  weakness). Negative for headaches.  All other systems reviewed and are negative.     Allergies  Latex and Percocet  Home Medications   Prior to Admission medications   Medication Sig Start Date End Date Taking? Authorizing Provider  Acetaminophen (TYLENOL ARTHRITIS PAIN PO) Take 1 tablet by mouth daily as needed.    Yes Historical Provider, MD  acidophilus (RISAQUAD) CAPS capsule Take 1 capsule by mouth at bedtime. Taking samples for 30 days   Yes Historical Provider, MD  aspirin 81 MG tablet Take 81 mg by mouth daily.   Yes Historical Provider, MD  dicyclomine (BENTYL) 20 MG tablet Take 20 mg by  mouth every other day.  09/10/13  Yes Historical Provider, MD  DULoxetine (CYMBALTA) 60 MG capsule Take 60 mg by mouth daily.   Yes Historical Provider, MD  estradiol-norethindrone (ACTIVELLA) 1-0.5 MG per tablet Take 0.5 tablets by mouth every other day.    Yes Historical Provider, MD  fish oil-omega-3 fatty acids 1000 MG capsule Take 1 g by mouth every other day.    Yes Historical Provider, MD  lisinopril (PRINIVIL,ZESTRIL) 10 MG tablet Take 10 mg by mouth daily.   Yes Historical Provider, MD  metoprolol tartrate (LOPRESSOR) 25 MG tablet Take 12.5 mg by mouth 2 (two) times daily.    Yes Historical Provider, MD  mometasone (NASONEX) 50 MCG/ACT nasal spray Place 1-2 sprays into both nostrils every 8 (eight) hours as needed (nasal congestion).   Yes Historical Provider, MD  Multiple Vitamin (MULTIVITAMIN) tablet Take 1 tablet by mouth daily.   Yes Historical Provider, MD  naproxen sodium (ANAPROX) 220 MG tablet Take 220 mg by mouth 2 (two) times daily with a meal.   Yes Historical Provider, MD  pantoprazole (PROTONIX) 40 MG tablet Take 40 mg by mouth 2 (two) times daily.    Yes Historical Provider, MD  promethazine (PHENERGAN) 25 MG tablet Take 1 tablet (25 mg total) by mouth every 8 (eight) hours as needed for nausea or vomiting. 02/07/14  Yes Leandrew Koyanagi, MD  simvastatin (ZOCOR) 20 MG tablet Take 20 mg by mouth daily.   Yes Historical Provider, MD   BP 137/67  Pulse 58  Temp(Src) 98.5 F (36.9 C) (Oral)  Resp 20  Ht 5' 1"  (1.549 m)  Wt 165 lb (74.844 kg)  BMI 31.19 kg/m2  SpO2 100% Physical Exam  Nursing note and vitals reviewed. Constitutional: She is oriented to person, place, and time. She appears well-developed and well-nourished. No distress.  Sitting up in bed. NAD. Speaks in full sentences.  HENT:  Head: Normocephalic and atraumatic.  Eyes: Conjunctivae are normal. Right eye exhibits no discharge. Left eye exhibits no discharge.  Neck: No tracheal deviation present.   Cardiovascular: Normal rate, regular rhythm, normal heart sounds and intact distal pulses.   Pulmonary/Chest: Effort normal and breath sounds normal. No stridor. No respiratory distress. She has no wheezes. She has no rales. She exhibits tenderness (moderate left lateral chest. no rash. No deformities. ).  Abdominal: Soft. She exhibits no distension. There is no tenderness. There is no guarding.  Musculoskeletal: She exhibits no edema and no tenderness.  Neurological: She is alert and oriented to person, place, and time.  Skin: Skin is warm and dry.  Psychiatric: She has a normal mood and affect. Her behavior is normal.    ED Course  Procedures (including critical care time) Labs Review Labs Reviewed  BASIC METABOLIC PANEL - Abnormal; Notable for the following:    GFR calc  non Af Amer 88 (*)    All other components within normal limits  CBC  I-STAT TROPOININ, ED  Randolm Idol, ED    Imaging Review Dg Chest 2 View  02/19/2014   CLINICAL DATA:  Chest pain.  EXAM: CHEST  2 VIEW  COMPARISON:  March 18, 2013.  FINDINGS: The heart size and mediastinal contours are within normal limits. Both lungs are clear. No pneumothorax or pleural effusion is noted. The visualized skeletal structures are unremarkable.  IMPRESSION: No acute cardiopulmonary abnormality seen.   Electronically Signed   By: Sabino Dick M.D.   On: 02/19/2014 18:04     EKG Interpretation   Date/Time:  Friday February 19 2014 15:52:57 EDT Ventricular Rate:  60 PR Interval:  119 QRS Duration: 89 QT Interval:  434 QTC Calculation: 434 R Axis:   27 Text Interpretation:  Sinus rhythm Borderline short PR interval Borderline  T abnormalities, inferior leads No significant change since last tracing  Confirmed by YAO  MD, DAVID ([id number]) on 02/19/2014 3:57:35 PM      MDM   Final diagnoses:  Chest pain, unspecified chest pain type    63 yo F pw atypical CP in setting of anxiety. Reproducible on exam.  EKG reassuring.   Prior negative cardiac w/u based on review of records.  Very low suspicion for ACS based on presentation. Will obtain troponin and delta troponin.  Do not suspect PE.  CXR unremarkable.  troponins negative x2.  Remains well appearing. Sitting up in bed. NAD. Pain reproducible on exam.   Likely musculoskeletal chest pain Doubt Aortic Dissection, Pancreatitis, Arrhythmia, Pneumothorax, Endo/Myo/Pericarditis, Shingles, Emergent complications of an Ulcer, Esophageal pathology, or other emergent pathology.   Dc home. Return precautions given. Follow up with primary care provider. Patient in agreement with plan   Labs and imaging reviewed by myself and considered in medical decision making if ordered. Imaging interpreted by radiology.   Discussed case with Dr. Darl Householder who is in agreement with assessment and plan.     Bonnita Hollow, MD 02/20/14 [phone]

## 2014-09-05 IMAGING — CR DG ABDOMEN ACUTE W/ 1V CHEST
3 series · 3 of 3 positions shown · non-contrast
Comparison: Chest radiograph 12/03/2012.

CLINICAL DATA: Passing blood.  Evaluate for pneumoperitoneum.

ACUTE ABDOMEN SERIES (ABDOMEN 2 VIEW & CHEST 1 VIEW)

[w chest pa]
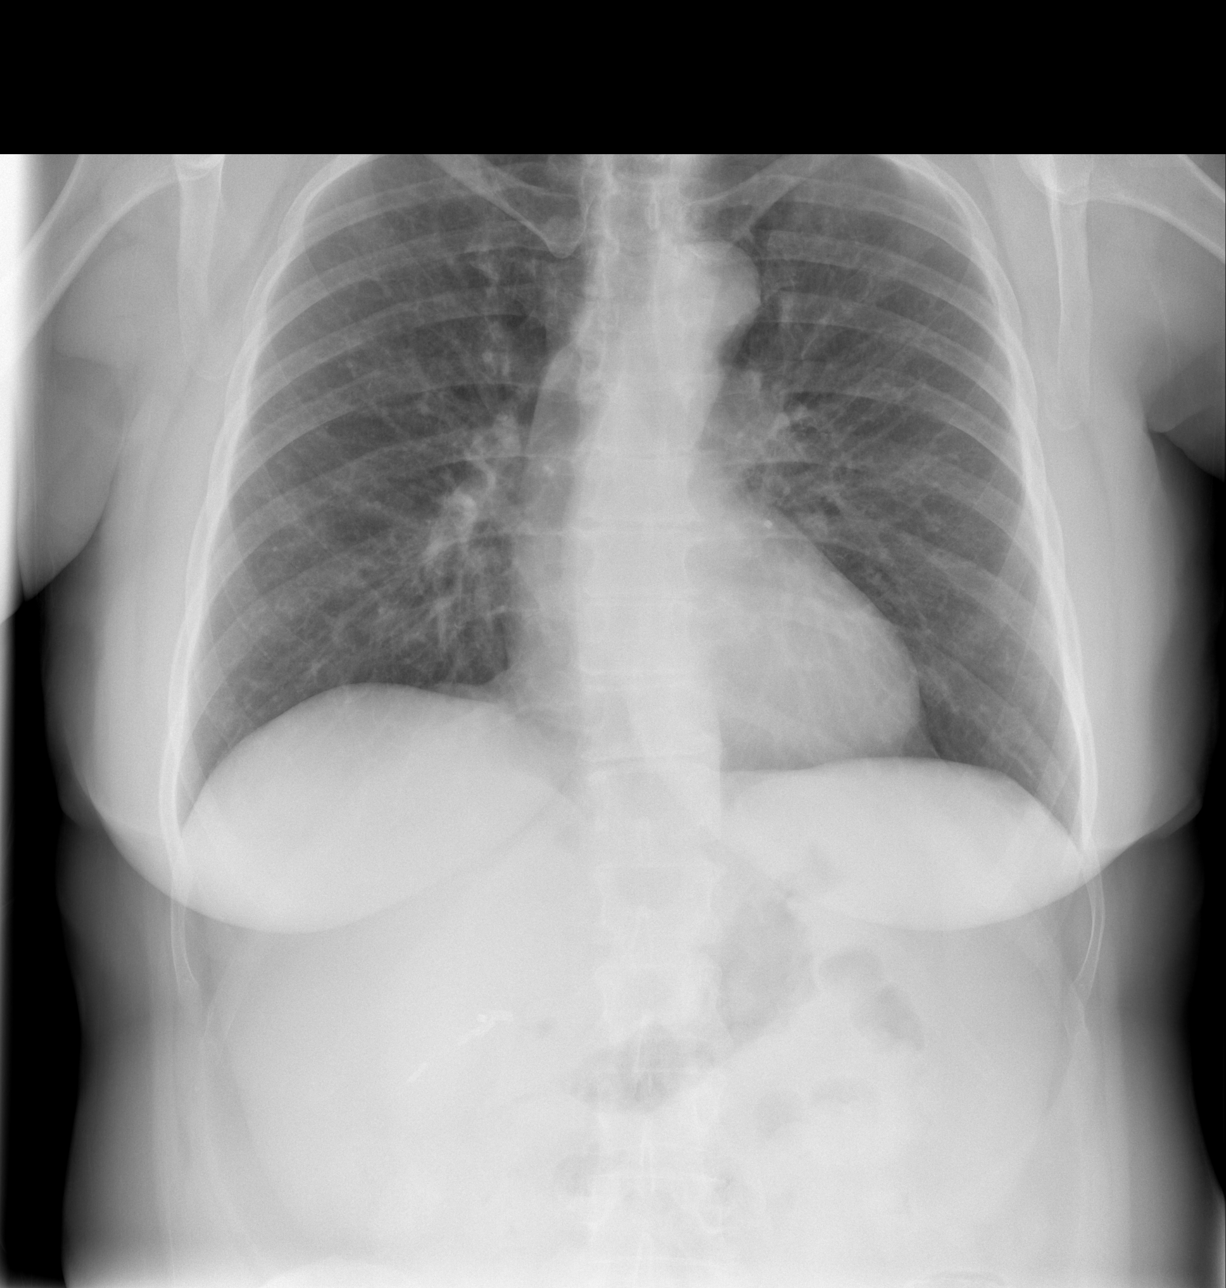

[w abdomen upright]
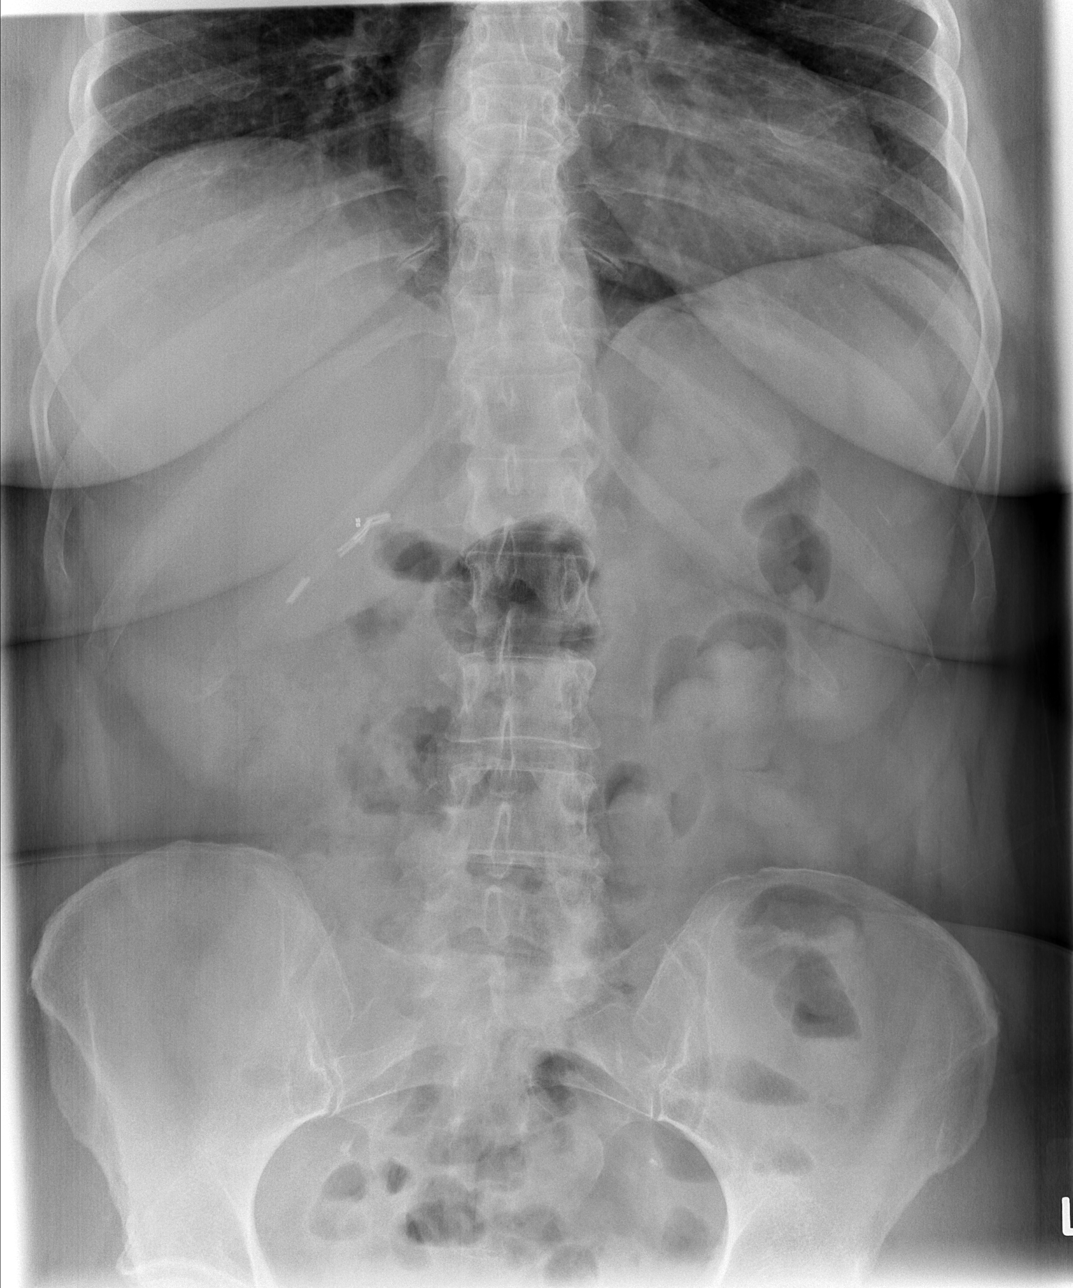

[t abdomen supine]
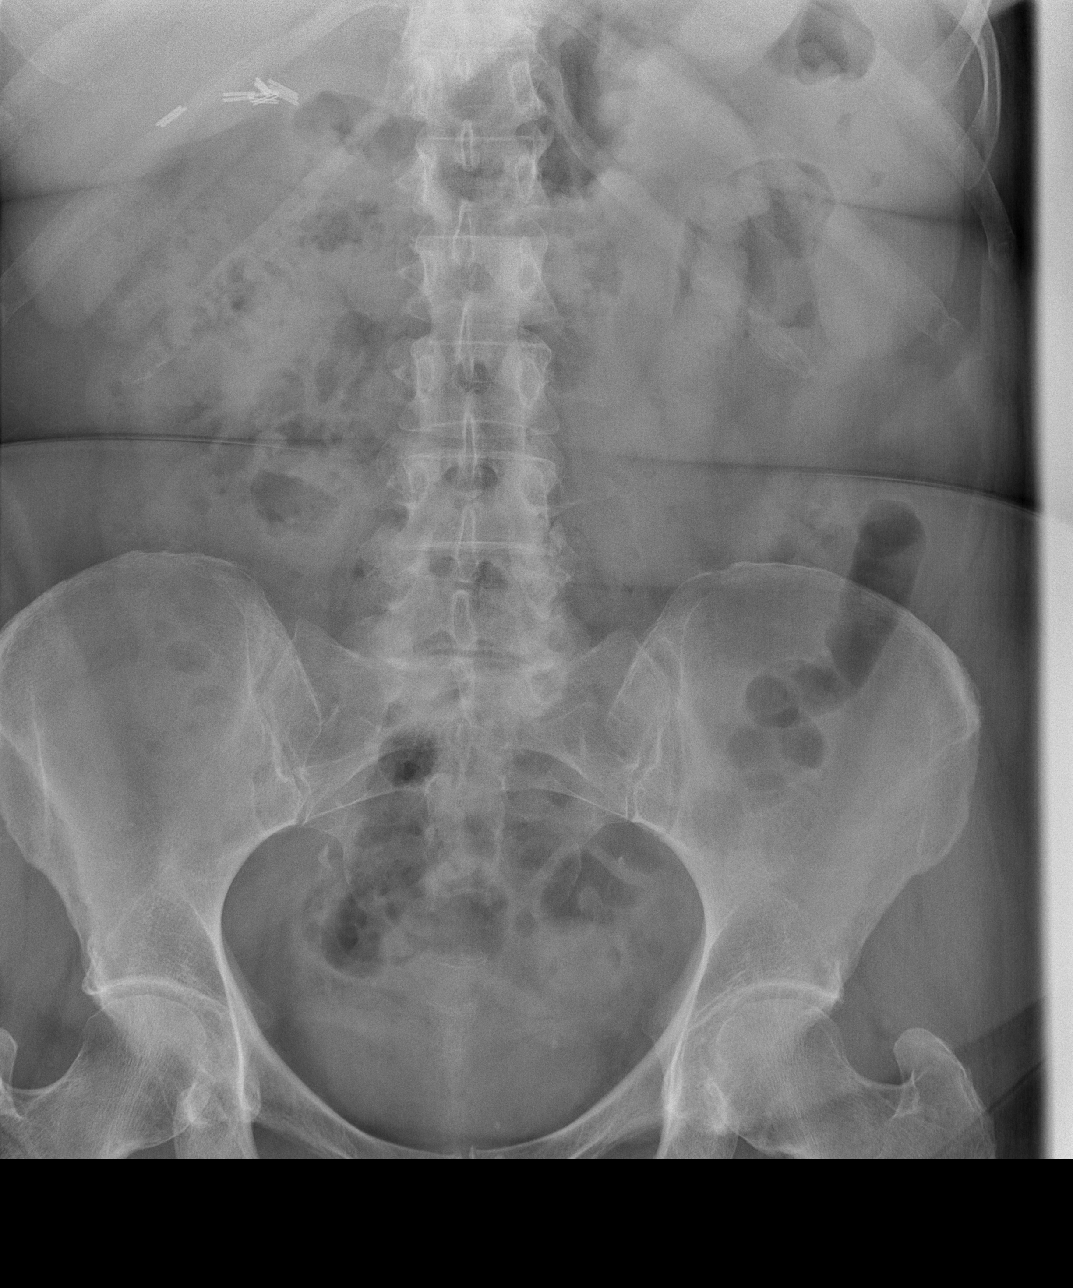

[3 of 3 positions shown; findings below may reference images not displayed]

FINDINGS: Frontal view of the chest shows midline trachea and
normal heart size.  Lungs are clear.  Heart size normal.  No
pericardial effusion.

Two views of the abdomen show gas and scattered air fluid levels in
small bowel and colon.  No definite small bowel dilatation.
Surgical clips in right upper quadrant.  No definite free air.
IMPRESSION: Bowel gas pattern is somewhat nonspecific but without evidence of
overt obstruction.  No definite free air.

## 2014-11-09 ENCOUNTER — Ambulatory Visit
Admission: RE | Admit: 2014-11-09 | Discharge: 2014-11-09 | Disposition: A | Discharge status: Home / Self Care | Payer: Federal, State, Local not specified - PPO | Source: Ambulatory Visit | Attending: Family Medicine | Admitting: Family Medicine

## 2014-11-09 ENCOUNTER — Other Ambulatory Visit: Payer: Self-pay | Admitting: Family Medicine

## 2014-11-09 DIAGNOSIS — R0789 Other chest pain: Secondary | ICD-10-CM

## 2015-06-01 ENCOUNTER — Encounter (HOSPITAL_COMMUNITY): Payer: Self-pay | Admitting: Physician Assistant

## 2015-06-01 DIAGNOSIS — M1712 Unilateral primary osteoarthritis, left knee: Secondary | ICD-10-CM | POA: Diagnosis present

## 2015-06-01 NOTE — H&P (Signed)
TOTAL KNEE ADMISSION H&P  Patient is being admitted for left total knee arthroplasty.  Subjective:  Chief Complaint:left knee pain.  HPI: Kristen Wilson, 64 y.o. female, has a history of pain and functional disability in the left knee due to arthritis and has failed non-surgical conservative treatments for greater than 12 weeks to includeNSAID's and/or analgesics, corticosteriod injections, viscosupplementation injections, flexibility and strengthening excercises, supervised PT with diminished ADL's post treatment, weight reduction as appropriate and activity modification.  Onset of symptoms was gradual, starting 10 years ago with gradually worsening course since that time. The patient noted prior procedures on the knee to include  arthroscopy and menisectomy on the left knee(s).  Patient currently rates pain in the left knee(s) at 10 out of 10 with activity. Patient has night pain, worsening of pain with activity and weight bearing, pain that interferes with activities of daily living, crepitus and joint swelling.  Patient has evidence of subchondral sclerosis, periarticular osteophytes and joint space narrowing by imaging studies.  There is no active infection.  Patient Active Problem List   Diagnosis Date Noted  . Primary localized osteoarthritis of left knee   . Hiatal hernia   . Arthritis   . Allergy   . Depression   . GERD (gastroesophageal reflux disease)   . Osteoporosis   . Shortness of breath   . IBS (irritable bowel syndrome)   . Hypertension 11/02/2011  . Chest pain 11/02/2011  . Hyperlipidemia    Past Medical History  Diagnosis Date  . Hypertension   . Hyperlipidemia   . Hiatal hernia   . Arthritis     "Whole body"  . Allergy   . Depression   . GERD (gastroesophageal reflux disease)   . Anxiety   . Osteoporosis   . Shortness of breath   . IBS (irritable bowel syndrome)   . Diverticulitis   . Primary localized osteoarthritis of left knee     Past Surgical  History  Procedure Laterality Date  . Cholecystectomy    . Tubal ligation    . Knee arthroscopy      left  . Tonsillectomy    . Colonoscopy N/A 03/20/2013    Procedure: COLONOSCOPY;  Surgeon: Wonda Horner, MD;  Location: [facility];  Service: Endoscopy;  Laterality: N/A;    No current facility-administered medications for this encounter.  Current outpatient prescriptions:  .  Acetaminophen (TYLENOL ARTHRITIS PAIN PO), Take 1 tablet by mouth daily as needed. , Disp: , Rfl:  .  aspirin 81 MG tablet, Take 81 mg by mouth daily., Disp: , Rfl:  .  clonazePAM (KLONOPIN) 0.5 MG tablet, Take 0.5 mg by mouth 2 (two) times daily., Disp: , Rfl:  .  dicyclomine (BENTYL) 20 MG tablet, Take 20 mg by mouth every other day. , Disp: , Rfl:  .  DULoxetine (CYMBALTA) 60 MG capsule, Take 60 mg by mouth daily., Disp: , Rfl:  .  fish oil-omega-3 fatty acids 1000 MG capsule, Take 1 g by mouth every other day. , Disp: , Rfl:  .  hydrochlorothiazide (HYDRODIURIL) 25 MG tablet, Take 25 mg by mouth daily., Disp: , Rfl:  .  lisinopril (PRINIVIL,ZESTRIL) 10 MG tablet, Take 10 mg by mouth daily., Disp: , Rfl:  .  metoprolol tartrate (LOPRESSOR) 25 MG tablet, Take 12.5 mg by mouth 2 (two) times daily. , Disp: , Rfl:  .  naproxen sodium (ANAPROX) 220 MG tablet, Take 220 mg by mouth 2 (two) times daily with a meal., Disp: ,  Rfl:  .  pantoprazole (PROTONIX) 40 MG tablet, Take 40 mg by mouth 2 (two) times daily. , Disp: , Rfl:  .  simvastatin (ZOCOR) 20 MG tablet, Take 20 mg by mouth daily., Disp: , Rfl:  Allergies  Allergen Reactions  . Latex Hives  . Percocet [Oxycodone-Acetaminophen] Nausea And Vomiting    Social History  Substance Use Topics  . Smoking status: Current Every Day Smoker -- 1.50 packs/day for 25 years    Types: Cigarettes  . Smokeless tobacco: Never Used  . Alcohol Use: No    Family History  Problem Relation Age of Onset  . Cancer Mother     breast  . Arthritis Mother   . Diabetes Mother    . Cancer Father     bladder,multiple myeloma     Review of Systems  Constitutional: Negative.   HENT: Negative.   Eyes: Negative.   Cardiovascular: Negative.   Gastrointestinal: Negative.   Genitourinary: Negative.   Musculoskeletal: Positive for back pain and joint pain.       Left knee   Skin: Negative.   Neurological: Negative.   Endo/Heme/Allergies: Negative.   Psychiatric/Behavioral: Negative.     Objective:  Physical Exam  Constitutional: She is oriented to person, place, and time. She appears well-developed and well-nourished.  HENT:  Head: Normocephalic and atraumatic.  Mouth/Throat: Oropharynx is clear and moist.  Eyes: Conjunctivae are normal. Pupils are equal, round, and reactive to light.  Neck: Neck supple.  Cardiovascular: Normal rate, regular rhythm and intact distal pulses.   Respiratory: Effort normal.  GI: Soft.  Genitourinary:  Not pertinent to current symptomatology therefore not examined.  Musculoskeletal:  Examination of both knees revealed pain bilaterally.  1+ synovitis.  1+ crepitation.  Range of motion -5 to 125 degrees.  Knees are stable with normal patellar tracking.  Vascular exam:  Pulses 2+ and symmetric.  Neurological: She is alert and oriented to person, place, and time.  Skin: Skin is warm and dry.  Psychiatric: She has a normal mood and affect. Her behavior is normal.    Vital signs in last 24 hours: Temp:  [98 F (36.7 C)] 98 F (36.7 C) (10/05 1400) Pulse Rate:  [57] 57 (10/05 1400) BP: (119)/(77) 119/77 mmHg (10/05 1400) SpO2:  [99 %] 99 % (10/05 1400) Weight:  [70.308 kg (155 lb)] 70.308 kg (155 lb) (10/05 1400)  Labs:   Estimated body mass index is 28.34 kg/(m^2) as calculated from the following:   Height as of this encounter: 5' 2"  (1.575 m).   Weight as of this encounter: 70.308 kg (155 lb).   Imaging Review Plain radiographs demonstrate severe degenerative joint disease of the left knee(s). The overall alignment  issignificant varus. The bone quality appears to be good for age and reported activity level.  Assessment/Plan:  End stage arthritis, left knee  Active Problems:   Hyperlipidemia   Hypertension   Hiatal hernia   Depression   GERD (gastroesophageal reflux disease)   IBS (irritable bowel syndrome)   Primary localized osteoarthritis of left knee   The patient history, physical examination, clinical judgment of the provider and imaging studies are consistent with end stage degenerative joint disease of the left knee(s) and total knee arthroplasty is deemed medically necessary. The treatment options including medical management, injection therapy arthroscopy and arthroplasty were discussed at length. The risks and benefits of total knee arthroplasty were presented and reviewed. The risks due to aseptic loosening, infection, stiffness, patella tracking problems, thromboembolic complications and  other imponderables were discussed. The patient acknowledged the explanation, agreed to proceed with the plan and consent was signed. Patient is being admitted for inpatient treatment for surgery, pain control, PT, OT, prophylactic antibiotics, VTE prophylaxis, progressive ambulation and ADL's and discharge planning. The patient is planning to be discharged home with home health services

## 2015-06-03 ENCOUNTER — Encounter (HOSPITAL_COMMUNITY): Payer: Self-pay

## 2015-06-03 ENCOUNTER — Encounter (HOSPITAL_COMMUNITY)
Admission: RE | Admit: 2015-06-03 | Discharge: 2015-06-03 | Disposition: A | Discharge status: Home / Self Care | Payer: PPO | Source: Ambulatory Visit | Attending: Orthopedic Surgery | Admitting: Orthopedic Surgery

## 2015-06-03 DIAGNOSIS — Z7982 Long term (current) use of aspirin: Secondary | ICD-10-CM | POA: Diagnosis not present

## 2015-06-03 DIAGNOSIS — Z01818 Encounter for other preprocedural examination: Secondary | ICD-10-CM | POA: Diagnosis present

## 2015-06-03 DIAGNOSIS — Z79899 Other long term (current) drug therapy: Secondary | ICD-10-CM | POA: Insufficient documentation

## 2015-06-03 DIAGNOSIS — K219 Gastro-esophageal reflux disease without esophagitis: Secondary | ICD-10-CM | POA: Diagnosis not present

## 2015-06-03 DIAGNOSIS — E785 Hyperlipidemia, unspecified: Secondary | ICD-10-CM | POA: Insufficient documentation

## 2015-06-03 DIAGNOSIS — M1712 Unilateral primary osteoarthritis, left knee: Secondary | ICD-10-CM | POA: Diagnosis not present

## 2015-06-03 DIAGNOSIS — Z0183 Encounter for blood typing: Secondary | ICD-10-CM | POA: Insufficient documentation

## 2015-06-03 DIAGNOSIS — I10 Essential (primary) hypertension: Secondary | ICD-10-CM | POA: Insufficient documentation

## 2015-06-03 DIAGNOSIS — Z01812 Encounter for preprocedural laboratory examination: Secondary | ICD-10-CM | POA: Diagnosis not present

## 2015-06-03 DIAGNOSIS — F17200 Nicotine dependence, unspecified, uncomplicated: Secondary | ICD-10-CM | POA: Insufficient documentation

## 2015-06-03 HISTORY — DX: Cough: R05

## 2015-06-03 HISTORY — DX: Pneumonia, unspecified organism: J18.9

## 2015-06-03 HISTORY — DX: Cough, unspecified: R05.9

## 2015-06-03 HISTORY — DX: Angina pectoris, unspecified: I20.9

## 2015-06-03 LAB — CBC WITH DIFFERENTIAL/PLATELET
BASOS ABS: 0 10*3/uL (ref 0.0–0.1)
Basophils Relative: 0 %
EOS PCT: 6 %
Eosinophils Absolute: 0.4 10*3/uL (ref 0.0–0.7)
HCT: 45.6 % (ref 36.0–46.0)
Hemoglobin: 15.4 g/dL — ABNORMAL HIGH (ref 12.0–15.0)
Lymphocytes Relative: 36 %
Lymphs Abs: 2.5 10*3/uL (ref 0.7–4.0)
MCH: 31.2 pg (ref 26.0–34.0)
MCHC: 33.8 g/dL (ref 30.0–36.0)
MCV: 92.3 fL (ref 78.0–100.0)
MONO ABS: 0.4 10*3/uL (ref 0.1–1.0)
Monocytes Relative: 6 %
Neutro Abs: 3.7 10*3/uL (ref 1.7–7.7)
Neutrophils Relative %: 52 %
PLATELETS: 168 10*3/uL (ref 150–400)
RBC: 4.94 MIL/uL (ref 3.87–5.11)
RDW: 12.7 % (ref 11.5–15.5)
WBC: 7 10*3/uL (ref 4.0–10.5)

## 2015-06-03 LAB — COMPREHENSIVE METABOLIC PANEL
ALBUMIN: 3.6 g/dL (ref 3.5–5.0)
ALT: 20 U/L (ref 14–54)
AST: 22 U/L (ref 15–41)
Alkaline Phosphatase: 60 U/L (ref 38–126)
Anion gap: 10 (ref 5–15)
BUN: 10 mg/dL (ref 6–20)
CHLORIDE: 99 mmol/L — AB (ref 101–111)
CO2: 30 mmol/L (ref 22–32)
Calcium: 9.3 mg/dL (ref 8.9–10.3)
Creatinine, Ser: 0.99 mg/dL (ref 0.44–1.00)
GFR calc Af Amer: >60 mL/min (ref >60)
GFR calc non Af Amer: 59 mL/min — ABNORMAL LOW (ref >60)
GLUCOSE: 98 mg/dL (ref 65–99)
POTASSIUM: 3.9 mmol/L (ref 3.5–5.1)
Sodium: 139 mmol/L (ref 135–145)
Total Bilirubin: 0.6 mg/dL (ref 0.3–1.2)
Total Protein: 6.3 g/dL — ABNORMAL LOW (ref 6.5–8.1)

## 2015-06-03 LAB — ABO/RH: ABO/RH(D): O POS

## 2015-06-03 LAB — SURGICAL PCR SCREEN
MRSA, PCR: NEGATIVE
STAPHYLOCOCCUS AUREUS: NEGATIVE

## 2015-06-03 LAB — TYPE AND SCREEN
ABO/RH(D): O POS
ANTIBODY SCREEN: NEGATIVE

## 2015-06-03 LAB — PROTIME-INR
INR: 1.09 (ref 0.00–1.49)
Prothrombin Time: 14.3 seconds (ref 11.6–15.2)

## 2015-06-03 LAB — APTT: aPTT: 29 seconds (ref 24–37)

## 2015-06-03 NOTE — Pre-Procedure Instructions (Addendum)
Kristen Wilson  06/03/2015      [id number], [place] [address] Phone: [phone] Fax: [place] [id number] - [place], [place] - [facility] [address] [address] Phone: [phone] Fax: [phone]    Your procedure is scheduled on 06/13/15.  Report to [facility] short stay admitting at 530 A.M.  Call this number if you have problems the morning of surgery:  [phone]   Remember:  Do not eat food or drink liquids after midnight.  Take these medicines the morning of surgery with A SIP OF WATER clonazepam, bentyl, metoprolol, protonix, tylenol if needed  STOP all herbel meds, nsaids (aleve,naproxen,advil,ibuprofen) 5 days prior to surgery starting 06/08/15 including vitamins, aspirin   Do not wear jewelry, make-up or nail polish.  Do not wear lotions, powders, or perfumes.  You may wear deodorant.  Do not shave 48 hours prior to surgery.  Men may shave face and neck.  Do not bring valuables to the hospital.  [facility] is not responsible for any belongings or valuables.  Contacts, dentures or bridgework may not be worn into surgery.  Leave your suitcase in the car.  After surgery it may be brought to your room.  For patients admitted to the hospital, discharge time will be determined by your treatment team.  Patients discharged the day of surgery will not be allowed to drive home.   Name and phone number of your driver:  Special instructions:  Special Instructions: [place] - Preparing for Surgery  Before surgery, you can play an important role.  Because skin is not sterile, your skin needs to be as free of germs as possible.  You can reduce the number of germs on you skin by washing with CHG (chlorahexidine gluconate) soap before surgery.  CHG is an antiseptic cleaner which kills germs and bonds with  the skin to continue killing germs even after washing.  Please DO NOT use if you have an allergy to CHG or antibacterial soaps.  If your skin becomes reddened/irritated stop using the CHG and inform your nurse when you arrive at Short Stay.  Do not shave (including legs and underarms) for at least 48 hours prior to the first CHG shower.  You may shave your face.  Please follow these instructions carefully:   1.  Shower with CHG Soap the night before surgery and the morning of Surgery.  2.  If you choose to wash your hair, wash your hair first as usual with your normal shampoo.  3.  After you shampoo, rinse your hair and body thoroughly to remove the Shampoo.  4.  Use CHG as you would any other liquid soap.  You can apply chg directly  to the skin and wash gently with scrungie or a clean washcloth.  5.  Apply the CHG Soap to your body ONLY FROM THE NECK DOWN.  Do not use on open wounds or open sores.  Avoid contact with your eyes ears, mouth and genitals (private parts).  Wash genitals (private parts)       with your normal soap.  6.  Wash thoroughly, paying special attention to the area where your surgery will be performed.  7.  Thoroughly rinse your body with warm water from the neck down.  8.  DO NOT shower/wash with your normal soap after using and rinsing  off the CHG Soap.  9.  Pat yourself dry with a clean towel.            10.  Wear clean pajamas.            11.  Place clean sheets on your bed the night of your first shower and do not sleep with pets.  Day of Surgery  Do not apply any lotions/deodorants the morning of surgery.  Please wear clean clothes to the hospital/surgery center.  Please read over the following fact sheets that you were given. Pain Booklet, Coughing and Deep Breathing, Blood Transfusion Information, MRSA Information and Surgical Site Infection Prevention

## 2015-06-03 NOTE — Progress Notes (Addendum)
Anesthesia Chart Review:  Pt is 64 year old female scheduled for L total knee arthroplasty on 06/13/2015 with Dr. Noemi Chapel.   PMH includes: HTN, hyperlipidemia, , GERD. Current smoker. BMI 29.  Medications include: ASA, hctz, lisinopril, metoprolol, protonix, simvastatin.   Preoperative labs reviewed.    Chest x-ray 11/09/2014 reviewed. No acute cardiopulmonary disease. Chronic coarsening of interstitial markings  EKG 06/03/2015: Sinus bradycardia (56 bpm). Nonspecific T wave abnormality  Pt reports stress and echo in last 3 years. Awaiting records.   Willeen Cass, FNP-BC [facility]/Anesthesiology Phone: [phone] 06/03/2015 3:58 PM   Addendum: Records just received from [facility]. Had seen cardiologist Dr. Stanton Kidney in 2013 with stress, echo, carotid duplex, and 24 hour holter monitor (see below).   10/23/11 Echo: LV size is normal. Normal LV systolic function. Estimated EF 65%. No obvious regional wall motion abnormality. Probably mild to moderate LVH. RV size and function appear normal. No AR or AS. Mild MR/TR, and PI. Normal pulmonic pressure. No LVOT obstruction, MVP, ASD nor VSD.   10/23/11 Nuclear stress test: Stress ECG was normal. Myocardial perfusion imaging is normal. There is no evidence of ischemia or scar. LVEF estimated at 72%.   09/2014 24 hour holter monitor showed SR with artifact.  10/26/11 Carotid duplex: Bilateral, mild, heterogeneous plaque in the carotid systems, but no high grade stenosis. Vertebral flows are antegrade and normal.  If no acute changes then I would anticipate that she could proceed as planned.  George Hugh [facility] Short Stay Center/Anesthesiology Phone [phone] 06/10/2015 5:38 PM

## 2015-06-03 NOTE — Progress Notes (Signed)
Patient concerned with dystalic pressure. No symptoms at this time. Previous problem with elevated pressure. inst patient to call pcp to see if needed to come in for a check

## 2015-06-04 LAB — URINE CULTURE

## 2015-06-12 MED ORDER — LACTATED RINGERS IV SOLN
INTRAVENOUS | Status: DC
Start: 2015-06-12 — End: 2015-06-13
  Administered 2015-06-13 (×2): via INTRAVENOUS

## 2015-06-12 MED ORDER — CEFAZOLIN SODIUM-DEXTROSE 2-3 GM-% IV SOLR
2.0000 g | INTRAVENOUS | Status: AC
  Administered 2015-06-13: 2 g via INTRAVENOUS
  Filled 2015-06-12: qty 50

## 2015-06-13 ENCOUNTER — Inpatient Hospital Stay (HOSPITAL_COMMUNITY): Payer: PPO | Admitting: Certified Registered Nurse Anesthetist

## 2015-06-13 ENCOUNTER — Inpatient Hospital Stay (HOSPITAL_COMMUNITY)
Admission: RE | Admit: 2015-06-13 | Discharge: 2015-06-15 | DRG: 470 | Disposition: A | Discharge status: Home Health | Payer: PPO | Source: Ambulatory Visit | Attending: Orthopedic Surgery | Admitting: Orthopedic Surgery

## 2015-06-13 ENCOUNTER — Inpatient Hospital Stay (HOSPITAL_COMMUNITY): Payer: PPO | Admitting: Emergency Medicine

## 2015-06-13 ENCOUNTER — Encounter (HOSPITAL_COMMUNITY)
Admission: RE | Disposition: A | Discharge status: Home Health | Payer: Self-pay | Source: Ambulatory Visit | Attending: Orthopedic Surgery

## 2015-06-13 DIAGNOSIS — M810 Age-related osteoporosis without current pathological fracture: Secondary | ICD-10-CM | POA: Diagnosis present

## 2015-06-13 DIAGNOSIS — M179 Osteoarthritis of knee, unspecified: Secondary | ICD-10-CM | POA: Diagnosis present

## 2015-06-13 DIAGNOSIS — R7981 Abnormal blood-gas level: Secondary | ICD-10-CM

## 2015-06-13 DIAGNOSIS — Z79899 Other long term (current) drug therapy: Secondary | ICD-10-CM | POA: Diagnosis not present

## 2015-06-13 DIAGNOSIS — F32A Depression, unspecified: Secondary | ICD-10-CM | POA: Diagnosis present

## 2015-06-13 DIAGNOSIS — M1710 Unilateral primary osteoarthritis, unspecified knee: Secondary | ICD-10-CM | POA: Diagnosis present

## 2015-06-13 DIAGNOSIS — E785 Hyperlipidemia, unspecified: Secondary | ICD-10-CM | POA: Diagnosis present

## 2015-06-13 DIAGNOSIS — M1712 Unilateral primary osteoarthritis, left knee: Secondary | ICD-10-CM | POA: Diagnosis present

## 2015-06-13 DIAGNOSIS — I10 Essential (primary) hypertension: Secondary | ICD-10-CM | POA: Diagnosis present

## 2015-06-13 DIAGNOSIS — Z91040 Latex allergy status: Secondary | ICD-10-CM

## 2015-06-13 DIAGNOSIS — F418 Other specified anxiety disorders: Secondary | ICD-10-CM | POA: Diagnosis present

## 2015-06-13 DIAGNOSIS — J069 Acute upper respiratory infection, unspecified: Secondary | ICD-10-CM | POA: Diagnosis not present

## 2015-06-13 DIAGNOSIS — Z7982 Long term (current) use of aspirin: Secondary | ICD-10-CM

## 2015-06-13 DIAGNOSIS — K449 Diaphragmatic hernia without obstruction or gangrene: Secondary | ICD-10-CM

## 2015-06-13 DIAGNOSIS — K219 Gastro-esophageal reflux disease without esophagitis: Secondary | ICD-10-CM | POA: Diagnosis present

## 2015-06-13 DIAGNOSIS — M25562 Pain in left knee: Secondary | ICD-10-CM | POA: Diagnosis present

## 2015-06-13 DIAGNOSIS — K589 Irritable bowel syndrome without diarrhea: Secondary | ICD-10-CM | POA: Diagnosis present

## 2015-06-13 DIAGNOSIS — Z885 Allergy status to narcotic agent status: Secondary | ICD-10-CM | POA: Diagnosis not present

## 2015-06-13 DIAGNOSIS — F329 Major depressive disorder, single episode, unspecified: Secondary | ICD-10-CM | POA: Diagnosis present

## 2015-06-13 DIAGNOSIS — F17200 Nicotine dependence, unspecified, uncomplicated: Secondary | ICD-10-CM | POA: Diagnosis present

## 2015-06-13 HISTORY — PX: TOTAL KNEE ARTHROPLASTY: SHX125

## 2015-06-13 HISTORY — DX: Acute upper respiratory infection, unspecified: J06.9

## 2015-06-13 HISTORY — DX: Unilateral primary osteoarthritis, left knee: M17.12

## 2015-06-13 LAB — CBC
HCT: 42.5 % (ref 36.0–46.0)
HEMOGLOBIN: 14.4 g/dL (ref 12.0–15.0)
MCH: 31.2 pg (ref 26.0–34.0)
MCHC: 33.9 g/dL (ref 30.0–36.0)
MCV: 92 fL (ref 78.0–100.0)
PLATELETS: 168 10*3/uL (ref 150–400)
RBC: 4.62 MIL/uL (ref 3.87–5.11)
RDW: 12.9 % (ref 11.5–15.5)
WBC: 10.2 10*3/uL (ref 4.0–10.5)

## 2015-06-13 LAB — CREATININE, SERUM
CREATININE: 0.96 mg/dL (ref 0.44–1.00)
GFR calc Af Amer: >60 mL/min (ref >60)

## 2015-06-13 SURGERY — ARTHROPLASTY, KNEE, TOTAL
Anesthesia: Regional | Site: Knee | Laterality: Left

## 2015-06-13 MED ORDER — MEPERIDINE HCL 25 MG/ML IJ SOLN: 6.2500 mg | INTRAMUSCULAR | Status: DC | PRN

## 2015-06-13 MED ORDER — DIPHENHYDRAMINE HCL 12.5 MG/5ML PO ELIX: 12.5000 mg | ORAL_SOLUTION | ORAL | Status: DC | PRN

## 2015-06-13 MED ORDER — MENTHOL 3 MG MT LOZG: 1.0000 | LOZENGE | OROMUCOSAL | Status: DC | PRN

## 2015-06-13 MED ORDER — MORPHINE SULFATE (PF) 2 MG/ML IV SOLN
2.0000 mg | INTRAVENOUS | Status: DC | PRN
  Administered 2015-06-13 (×3): 2 mg via INTRAVENOUS
  Filled 2015-06-13 (×3): qty 1

## 2015-06-13 MED ORDER — ALUM & MAG HYDROXIDE-SIMETH 200-200-20 MG/5ML PO SUSP
30.0000 mL | ORAL | Status: DC | PRN
Start: 2015-06-13 — End: 2015-06-15
  Administered 2015-06-15: 30 mL via ORAL
  Filled 2015-06-13: qty 30

## 2015-06-13 MED ORDER — ONDANSETRON HCL 4 MG PO TABS: 4.0000 mg | ORAL_TABLET | Freq: Four times a day (QID) | ORAL | Status: DC | PRN

## 2015-06-13 MED ORDER — POVIDONE-IODINE 7.5 % EX SOLN: Freq: Once | CUTANEOUS | Status: DC

## 2015-06-13 MED ORDER — HYDROMORPHONE HCL 1 MG/ML IJ SOLN: 0.2500 mg | INTRAMUSCULAR | Status: DC | PRN

## 2015-06-13 MED ORDER — ONDANSETRON HCL 4 MG/2ML IJ SOLN: 4.0000 mg | Freq: Four times a day (QID) | INTRAMUSCULAR | Status: DC | PRN

## 2015-06-13 MED ORDER — CEFAZOLIN SODIUM-DEXTROSE 2-3 GM-% IV SOLR
2.0000 g | Freq: Four times a day (QID) | INTRAVENOUS | Status: AC
  Administered 2015-06-13 (×2): 2 g via INTRAVENOUS
  Filled 2015-06-13 (×2): qty 50

## 2015-06-13 MED ORDER — ACETAMINOPHEN 650 MG RE SUPP: 650.0000 mg | Freq: Four times a day (QID) | RECTAL | Status: DC | PRN

## 2015-06-13 MED ORDER — METOCLOPRAMIDE HCL 5 MG/ML IJ SOLN: 5.0000 mg | Freq: Three times a day (TID) | INTRAMUSCULAR | Status: DC | PRN

## 2015-06-13 MED ORDER — PROPOFOL 10 MG/ML IV BOLUS
INTRAVENOUS | Status: DC | PRN
  Administered 2015-06-13: 20 mg via INTRAVENOUS

## 2015-06-13 MED ORDER — DULOXETINE HCL 60 MG PO CPEP
60.0000 mg | ORAL_CAPSULE | Freq: Every day | ORAL | Status: DC
  Administered 2015-06-13 – 2015-06-14 (×2): 60 mg via ORAL
  Filled 2015-06-13 (×2): qty 1

## 2015-06-13 MED ORDER — 0.9 % SODIUM CHLORIDE (POUR BTL) OPTIME
TOPICAL | Status: DC | PRN
  Administered 2015-06-13: 1000 mL

## 2015-06-13 MED ORDER — MIDAZOLAM HCL 5 MG/5ML IJ SOLN
INTRAMUSCULAR | Status: DC | PRN
  Administered 2015-06-13: 2 mg via INTRAVENOUS

## 2015-06-13 MED ORDER — METOCLOPRAMIDE HCL 5 MG PO TABS: 5.0000 mg | ORAL_TABLET | Freq: Three times a day (TID) | ORAL | Status: DC | PRN

## 2015-06-13 MED ORDER — HYDROMORPHONE HCL 2 MG PO TABS
2.0000 mg | ORAL_TABLET | ORAL | Status: DC | PRN
  Administered 2015-06-13 – 2015-06-14 (×4): 2 mg via ORAL
  Administered 2015-06-14 (×2): 4 mg via ORAL
  Administered 2015-06-14 – 2015-06-15 (×4): 2 mg via ORAL
  Filled 2015-06-13 (×6): qty 1
  Filled 2015-06-13: qty 2
  Filled 2015-06-13 (×3): qty 1
  Filled 2015-06-13: qty 2

## 2015-06-13 MED ORDER — DEXAMETHASONE SODIUM PHOSPHATE 10 MG/ML IJ SOLN
INTRAMUSCULAR | Status: DC | PRN
  Administered 2015-06-13: 10 mg via INTRAVENOUS

## 2015-06-13 MED ORDER — PANTOPRAZOLE SODIUM 40 MG PO TBEC
40.0000 mg | DELAYED_RELEASE_TABLET | Freq: Two times a day (BID) | ORAL | Status: DC
  Administered 2015-06-13 – 2015-06-15 (×4): 40 mg via ORAL
  Filled 2015-06-13 (×4): qty 1

## 2015-06-13 MED ORDER — SIMVASTATIN 20 MG PO TABS
20.0000 mg | ORAL_TABLET | Freq: Every day | ORAL | Status: DC
  Administered 2015-06-13 – 2015-06-15 (×3): 20 mg via ORAL
  Filled 2015-06-13 (×4): qty 1

## 2015-06-13 MED ORDER — EPHEDRINE SULFATE 50 MG/ML IJ SOLN
INTRAMUSCULAR | Status: AC
  Filled 2015-06-13: qty 1

## 2015-06-13 MED ORDER — FENTANYL CITRATE (PF) 100 MCG/2ML IJ SOLN
INTRAMUSCULAR | Status: DC | PRN
  Administered 2015-06-13 (×2): 50 ug via INTRAVENOUS

## 2015-06-13 MED ORDER — DEXAMETHASONE SODIUM PHOSPHATE 10 MG/ML IJ SOLN
10.0000 mg | Freq: Three times a day (TID) | INTRAMUSCULAR | Status: AC
  Administered 2015-06-13 – 2015-06-14 (×3): 10 mg via INTRAVENOUS
  Filled 2015-06-13 (×3): qty 1

## 2015-06-13 MED ORDER — BUPIVACAINE-EPINEPHRINE (PF) 0.25% -1:200000 IJ SOLN
INTRAMUSCULAR | Status: AC
Start: 2015-06-13 — End: 2015-06-13
  Filled 2015-06-13: qty 30

## 2015-06-13 MED ORDER — LISINOPRIL 40 MG PO TABS
40.0000 mg | ORAL_TABLET | Freq: Every day | ORAL | Status: DC
  Administered 2015-06-15: 40 mg via ORAL
  Filled 2015-06-13: qty 1

## 2015-06-13 MED ORDER — DEXAMETHASONE SODIUM PHOSPHATE 4 MG/ML IJ SOLN
INTRAMUSCULAR | Status: AC
  Filled 2015-06-13: qty 3

## 2015-06-13 MED ORDER — CHLORHEXIDINE GLUCONATE 4 % EX LIQD: 60.0000 mL | Freq: Once | CUTANEOUS | Status: DC

## 2015-06-13 MED ORDER — POLYETHYLENE GLYCOL 3350 17 G PO PACK
17.0000 g | PACK | Freq: Two times a day (BID) | ORAL | Status: DC
  Administered 2015-06-13 – 2015-06-15 (×5): 17 g via ORAL
  Filled 2015-06-13 (×5): qty 1

## 2015-06-13 MED ORDER — BUPIVACAINE-EPINEPHRINE 0.25% -1:200000 IJ SOLN
INTRAMUSCULAR | Status: DC | PRN
  Administered 2015-06-13: 30 mL

## 2015-06-13 MED ORDER — LIDOCAINE HCL (CARDIAC) 20 MG/ML IV SOLN
INTRAVENOUS | Status: AC
  Filled 2015-06-13: qty 5

## 2015-06-13 MED ORDER — ENOXAPARIN SODIUM 30 MG/0.3ML ~~LOC~~ SOLN
30.0000 mg | Freq: Two times a day (BID) | SUBCUTANEOUS | Status: DC
  Administered 2015-06-14 – 2015-06-15 (×3): 30 mg via SUBCUTANEOUS
  Filled 2015-06-13 (×3): qty 0.3

## 2015-06-13 MED ORDER — PROPOFOL 500 MG/50ML IV EMUL
INTRAVENOUS | Status: DC | PRN
  Administered 2015-06-13: 50 ug/kg/min via INTRAVENOUS

## 2015-06-13 MED ORDER — CLONAZEPAM 0.5 MG PO TABS
0.5000 mg | ORAL_TABLET | Freq: Two times a day (BID) | ORAL | Status: DC
  Administered 2015-06-13 – 2015-06-15 (×5): 0.5 mg via ORAL
  Filled 2015-06-13 (×5): qty 1

## 2015-06-13 MED ORDER — ONDANSETRON HCL 4 MG/2ML IJ SOLN
INTRAMUSCULAR | Status: AC
  Filled 2015-06-13: qty 2

## 2015-06-13 MED ORDER — ONDANSETRON HCL 4 MG/2ML IJ SOLN: 4.0000 mg | Freq: Once | INTRAMUSCULAR | Status: DC | PRN

## 2015-06-13 MED ORDER — ROCURONIUM BROMIDE 50 MG/5ML IV SOLN
INTRAVENOUS | Status: AC
  Filled 2015-06-13: qty 1

## 2015-06-13 MED ORDER — FENTANYL CITRATE (PF) 250 MCG/5ML IJ SOLN
INTRAMUSCULAR | Status: AC
  Filled 2015-06-13: qty 5

## 2015-06-13 MED ORDER — ACETAMINOPHEN 325 MG PO TABS
650.0000 mg | ORAL_TABLET | Freq: Four times a day (QID) | ORAL | Status: DC | PRN
  Administered 2015-06-14 (×2): 650 mg via ORAL
  Filled 2015-06-13 (×2): qty 2

## 2015-06-13 MED ORDER — POTASSIUM CHLORIDE IN NACL 20-0.9 MEQ/L-% IV SOLN
INTRAVENOUS | Status: DC
  Administered 2015-06-13 – 2015-06-14 (×2): via INTRAVENOUS
  Filled 2015-06-13 (×2): qty 1000

## 2015-06-13 MED ORDER — DICYCLOMINE HCL 20 MG PO TABS
20.0000 mg | ORAL_TABLET | Freq: Two times a day (BID) | ORAL | Status: DC
  Administered 2015-06-13 – 2015-06-15 (×4): 20 mg via ORAL
  Filled 2015-06-13 (×4): qty 1

## 2015-06-13 MED ORDER — HYDROCHLOROTHIAZIDE 25 MG PO TABS: 25.0000 mg | ORAL_TABLET | Freq: Every day | ORAL | Status: DC

## 2015-06-13 MED ORDER — METOPROLOL TARTRATE 50 MG PO TABS
50.0000 mg | ORAL_TABLET | Freq: Two times a day (BID) | ORAL | Status: DC
  Administered 2015-06-13 – 2015-06-15 (×4): 50 mg via ORAL
  Filled 2015-06-13 (×4): qty 1

## 2015-06-13 MED ORDER — DOCUSATE SODIUM 100 MG PO CAPS
100.0000 mg | ORAL_CAPSULE | Freq: Two times a day (BID) | ORAL | Status: DC
  Administered 2015-06-13 – 2015-06-15 (×5): 100 mg via ORAL
  Filled 2015-06-13 (×5): qty 1

## 2015-06-13 MED ORDER — SODIUM CHLORIDE 0.9 % IJ SOLN
INTRAMUSCULAR | Status: AC
  Filled 2015-06-13: qty 10

## 2015-06-13 MED ORDER — BUPIVACAINE-EPINEPHRINE (PF) 0.5% -1:200000 IJ SOLN
INTRAMUSCULAR | Status: DC | PRN
  Administered 2015-06-13: 30 mL via PERINEURAL

## 2015-06-13 MED ORDER — PHENYLEPHRINE 40 MCG/ML (10ML) SYRINGE FOR IV PUSH (FOR BLOOD PRESSURE SUPPORT)
PREFILLED_SYRINGE | INTRAVENOUS | Status: AC
  Filled 2015-06-13: qty 10

## 2015-06-13 MED ORDER — PHENOL 1.4 % MT LIQD: 1.0000 | OROMUCOSAL | Status: DC | PRN

## 2015-06-13 MED ORDER — PROPOFOL 10 MG/ML IV BOLUS
INTRAVENOUS | Status: AC
  Filled 2015-06-13: qty 20

## 2015-06-13 MED ORDER — SUCCINYLCHOLINE CHLORIDE 20 MG/ML IJ SOLN
INTRAMUSCULAR | Status: AC
  Filled 2015-06-13: qty 1

## 2015-06-13 MED ORDER — MIDAZOLAM HCL 2 MG/2ML IJ SOLN
INTRAMUSCULAR | Status: AC
  Filled 2015-06-13: qty 4

## 2015-06-13 MED ORDER — SODIUM CHLORIDE 0.9 % IR SOLN
Status: DC | PRN
  Administered 2015-06-13: 3000 mL

## 2015-06-13 SURGICAL SUPPLY — 73 items
APL SKNCLS STERI-STRIP NONHPOA (GAUZE/BANDAGES/DRESSINGS) ×1
BANDAGE ESMARK 6X9 LF (GAUZE/BANDAGES/DRESSINGS) ×1 IMPLANT
BENZOIN TINCTURE PRP APPL 2/3 (GAUZE/BANDAGES/DRESSINGS) ×2 IMPLANT
BLADE SAGITTAL 25.0X1.19X90 (BLADE) ×2 IMPLANT
BLADE SAW SGTL 11.0X1.19X90.0M (BLADE) IMPLANT
BLADE SAW SGTL 13.0X1.19X90.0M (BLADE) ×2 IMPLANT
BLADE SURG 10 STRL SS (BLADE) ×4 IMPLANT
BNDG CMPR 9X6 STRL LF SNTH (GAUZE/BANDAGES/DRESSINGS) ×1
BNDG CMPR MED 15X6 ELC VLCR LF (GAUZE/BANDAGES/DRESSINGS) ×1
BNDG ELASTIC 6X15 VLCR STRL LF (GAUZE/BANDAGES/DRESSINGS) ×2 IMPLANT
BNDG ESMARK 6X9 LF (GAUZE/BANDAGES/DRESSINGS) ×2
BOWL SMART MIX CTS (DISPOSABLE) ×2 IMPLANT
CAPT KNEE TOTAL 3 ATTUNE ×1 IMPLANT
CEMENT HV SMART SET (Cement) ×4 IMPLANT
COVER SURGICAL LIGHT HANDLE (MISCELLANEOUS) ×2 IMPLANT
CUFF TOURNIQUET SINGLE 34IN LL (TOURNIQUET CUFF) ×2 IMPLANT
CUFF TOURNIQUET SINGLE 44IN (TOURNIQUET CUFF) IMPLANT
DECANTER SPIKE VIAL GLASS SM (MISCELLANEOUS) ×1 IMPLANT
DRAPE EXTREMITY T 121X128X90 (DRAPE) ×2 IMPLANT
DRAPE INCISE IOBAN 66X45 STRL (DRAPES) ×2 IMPLANT
DRAPE PROXIMA HALF (DRAPES) ×2 IMPLANT
DRAPE U-SHAPE 47X51 STRL (DRAPES) ×2 IMPLANT
DRSG AQUACEL AG ADV 3.5X14 (GAUZE/BANDAGES/DRESSINGS) ×2 IMPLANT
DRSG PAD ABDOMINAL 8X10 ST (GAUZE/BANDAGES/DRESSINGS) ×4 IMPLANT
DURAPREP 26ML APPLICATOR (WOUND CARE) ×3 IMPLANT
ELECT CAUTERY BLADE 6.4 (BLADE) ×2 IMPLANT
ELECT REM PT RETURN 9FT ADLT (ELECTROSURGICAL) ×2
ELECTRODE REM PT RTRN 9FT ADLT (ELECTROSURGICAL) ×1 IMPLANT
EVACUATOR 1/8 PVC DRAIN (DRAIN) ×2 IMPLANT
FACESHIELD WRAPAROUND (MASK) ×2 IMPLANT
FACESHIELD WRAPAROUND OR TEAM (MASK) ×1 IMPLANT
GAUZE SPONGE 4X4 12PLY STRL (GAUZE/BANDAGES/DRESSINGS) ×2 IMPLANT
GLOVE BIO SURGEON STRL SZ7 (GLOVE) ×2 IMPLANT
GLOVE BIOGEL PI IND STRL 7.0 (GLOVE) ×1 IMPLANT
GLOVE BIOGEL PI IND STRL 7.5 (GLOVE) ×1 IMPLANT
GLOVE BIOGEL PI INDICATOR 7.0 (GLOVE) ×1
GLOVE BIOGEL PI INDICATOR 7.5 (GLOVE) ×1
GLOVE SS BIOGEL STRL SZ 7.5 (GLOVE) ×1 IMPLANT
GLOVE SUPERSENSE BIOGEL SZ 7.5 (GLOVE) ×1
GOWN STRL REUS W/ TWL LRG LVL3 (GOWN DISPOSABLE) ×1 IMPLANT
GOWN STRL REUS W/ TWL XL LVL3 (GOWN DISPOSABLE) ×2 IMPLANT
GOWN STRL REUS W/TWL LRG LVL3 (GOWN DISPOSABLE) ×2
GOWN STRL REUS W/TWL XL LVL3 (GOWN DISPOSABLE) ×4
HANDPIECE INTERPULSE COAX TIP (DISPOSABLE) ×2
HOOD PEEL AWAY FACE SHEILD DIS (HOOD) ×5 IMPLANT
IMMOBILIZER KNEE 22 UNIV (SOFTGOODS) ×2 IMPLANT
KIT BASIN OR (CUSTOM PROCEDURE TRAY) ×2 IMPLANT
KIT ROOM TURNOVER OR (KITS) ×2 IMPLANT
MANIFOLD NEPTUNE II (INSTRUMENTS) ×2 IMPLANT
MARKER SKIN DUAL TIP RULER LAB (MISCELLANEOUS) ×2 IMPLANT
NS IRRIG 1000ML POUR BTL (IV SOLUTION) ×2 IMPLANT
PACK TOTAL JOINT (CUSTOM PROCEDURE TRAY) ×2 IMPLANT
PACK UNIVERSAL I (CUSTOM PROCEDURE TRAY) ×2 IMPLANT
PAD ARMBOARD 7.5X6 YLW CONV (MISCELLANEOUS) ×4 IMPLANT
PADDING CAST COTTON 6X4 STRL (CAST SUPPLIES) ×2 IMPLANT
RUBBERBAND STERILE (MISCELLANEOUS) ×2 IMPLANT
SET HNDPC FAN SPRY TIP SCT (DISPOSABLE) ×1 IMPLANT
STRIP CLOSURE SKIN 1/2X4 (GAUZE/BANDAGES/DRESSINGS) ×2 IMPLANT
SUCTION FRAZIER TIP 10 FR DISP (SUCTIONS) ×2 IMPLANT
SUT ETHIBOND NAB CT1 #1 30IN (SUTURE) IMPLANT
SUT MNCRL AB 3-0 PS2 18 (SUTURE) ×2 IMPLANT
SUT VIC AB 0 CT1 27 (SUTURE) ×4
SUT VIC AB 0 CT1 27XBRD ANBCTR (SUTURE) ×2 IMPLANT
SUT VIC AB 1 CT1 27 (SUTURE) ×2
SUT VIC AB 1 CT1 27XBRD ANBCTR (SUTURE) IMPLANT
SUT VIC AB 2-0 CT1 27 (SUTURE) ×4
SUT VIC AB 2-0 CT1 TAPERPNT 27 (SUTURE) ×2 IMPLANT
SYR 30ML SLIP (SYRINGE) ×2 IMPLANT
TOWEL OR 17X24 6PK STRL BLUE (TOWEL DISPOSABLE) ×2 IMPLANT
TOWEL OR 17X26 10 PK STRL BLUE (TOWEL DISPOSABLE) ×2 IMPLANT
TRAY FOLEY CATH 16FR SILVER (SET/KITS/TRAYS/PACK) ×2 IMPLANT
TUBE CONNECTING 12X1/4 (SUCTIONS) ×2 IMPLANT
YANKAUER SUCT BULB TIP NO VENT (SUCTIONS) ×2 IMPLANT

## 2015-06-13 NOTE — Anesthesia Postprocedure Evaluation (Signed)
Anesthesia Post Note  Patient: Kristen Wilson  Procedure(s) Performed: Procedure(s) (LRB): LEFT TOTAL KNEE ARTHROPLASTY (Left)  Anesthesia type: spinal  Patient location: PACU  Post pain: Pain level controlled  Post assessment: Patient's Cardiovascular Status Stable  Last Vitals:  Filed Vitals:   06/13/15 1045  BP: 129/66  Pulse: 58  Temp: 36.4 C  Resp: 16    Post vital signs: Reviewed and stable  Level of consciousness: awake  Complications: No apparent anesthesia complications

## 2015-06-13 NOTE — Evaluation (Signed)
Physical Therapy Evaluation Patient Details Name: Kristen Wilson MRN: [id number] DOB: 06-21-1951 Today's Date: 06/13/2015   History of Present Illness  Pt admitted for L TKA with PMHx: IBS, depression, GERD  Clinical Impression  Pt very pleasant and moving well with cues for sequence and safety POD#0. Pt able to ambulate around room and perform HEP with limited ROM. HEP handout provided and pt educated for plan and goals for D/C. Pt with decreased LLE strength, ROM, transfers and gait who will benefit from acute therapy to maximize function, gait and strength to decrease burden of care.     Follow Up Recommendations Home health PT    Equipment Recommendations  None recommended by PT    Recommendations for Other Services OT consult     Precautions / Restrictions Precautions Precautions: Knee Required Braces or Orthoses: Knee Immobilizer - Left Knee Immobilizer - Left: Discontinue once straight leg raise with < 10 degree lag;On when out of bed or walking Restrictions Weight Bearing Restrictions: Yes LLE Weight Bearing: Weight bearing as tolerated      Mobility  Bed Mobility Overal bed mobility: Needs Assistance Bed Mobility: Supine to Sit     Supine to sit: Supervision;HOB elevated     General bed mobility comments: cues for sequence with RLE assisting LLE to EOB  Transfers Overall transfer level: Needs assistance   Transfers: Sit to/from Stand Sit to Stand: Min guard         General transfer comment: cues for hand and LLE placement with transfers  Ambulation/Gait Ambulation/Gait assistance: Min guard Ambulation Distance (Feet): 15 Feet Assistive device: Rolling walker (2 wheeled) Gait Pattern/deviations: Step-to pattern;Decreased stride length   Gait velocity interpretation: Below normal speed for age/gender General Gait Details: cues for posture, looking up and sequence of gait with KI on LLE  Stairs            Wheelchair Mobility    Modified  Rankin (Stroke Patients Only)       Balance Overall balance assessment: Needs assistance   Sitting balance-Leahy Scale: Good       Standing balance-Leahy Scale: Poor                               Pertinent Vitals/Pain Pain Assessment: 0-10 Pain Score: 3  Pain Location: pulling left knee Pain Intervention(s): Premedicated before session;Repositioned;Limited activity within patient's tolerance;Monitored during session  94% on RA    Home Living Family/patient expects to be discharged to:: Private residence Living Arrangements: Other relatives Available Help at Discharge: Family;Friend(s);Available PRN/intermittently Type of Home: House Home Access: Stairs to enter   Entrance Stairs-Number of Steps: 1 Home Layout: One level Home Equipment: Walker - 2 wheels;Cane - single point;Bedside commode;Shower seat      Prior Function Level of Independence: Independent               Hand Dominance        Extremity/Trunk Assessment   Upper Extremity Assessment: Overall WFL for tasks assessed           Lower Extremity Assessment: LLE deficits/detail   LLE Deficits / Details: limited strength and ROM as expected post op  Cervical / Trunk Assessment: Normal  Communication   Communication: No difficulties  Cognition Arousal/Alertness: Awake/alert Behavior During Therapy: WFL for tasks assessed/performed Overall Cognitive Status: Within Functional Limits for tasks assessed  General Comments      Exercises Total Joint Exercises Ankle Circles/Pumps: AROM;Both;5 reps;Supine Quad Sets: AROM;Both;5 reps;Supine Heel Slides: AAROM;Both;5 reps;Supine      Assessment/Plan    PT Assessment Patient needs continued PT services  PT Diagnosis Difficulty walking;Acute pain   PT Problem List Decreased strength;Decreased range of motion;Decreased activity tolerance;Decreased balance;Decreased mobility;Pain;Decreased knowledge of  use of DME  PT Treatment Interventions DME instruction;Gait training;Stair training;Functional mobility training;Therapeutic activities;Therapeutic exercise;Patient/family education   PT Goals (Current goals can be found in the Care Plan section) Acute Rehab PT Goals Patient Stated Goal: return home PT Goal Formulation: With patient Time For Goal Achievement: 06/27/15 Potential to Achieve Goals: Good    Frequency 7X/week   Barriers to discharge Decreased caregiver support pt normally assists sister she lives with. Pt will have assist of friends and son but not 24hr support    Co-evaluation               End of Session   Activity Tolerance: Patient tolerated treatment well Patient left: in chair;with call bell/phone within reach;with chair alarm set;with nursing/sitter in room;with family/visitor present Nurse Communication: Precautions;Weight bearing status         Time: 9892-1194 PT Time Calculation (min) (ACUTE ONLY): 26 min   Charges:   PT Evaluation $Initial PT Evaluation Tier I: 1 Procedure PT Treatments $Therapeutic Activity: 8-22 mins   PT G CodesMelford Aase 06/13/2015, 1:55 PM Elwyn Reach, [place]

## 2015-06-13 NOTE — Interval H&P Note (Signed)
History and Physical Interval Note:  06/13/2015 7:03 AM  Kristen Wilson  has presented today for surgery, with the diagnosis of PRIMARY LOCALIZED OA LEFT KNEE  The various methods of treatment have been discussed with the patient and family. After consideration of risks, benefits and other options for treatment, the patient has consented to  Procedure(s): LEFT TOTAL KNEE ARTHROPLASTY (Left) as a surgical intervention .  The patient's history has been reviewed, patient examined, no change in status, stable for surgery.  I have reviewed the patient's chart and labs.  Questions were answered to the patient's satisfaction.     Elsie Saas A

## 2015-06-13 NOTE — Anesthesia Preprocedure Evaluation (Addendum)
Anesthesia Evaluation  Patient identified by MRN, date of birth, ID band Patient awake    Reviewed: Allergy & Precautions, NPO status , Patient's Chart, lab work & pertinent test results, reviewed documented beta blocker date and time   Airway Mallampati: II  TM Distance: >3 FB Neck ROM: Full    Dental  (+) Teeth Intact, Dental Advisory Given   Pulmonary Current Smoker,    Pulmonary exam normal        Cardiovascular hypertension, Pt. on medications and Pt. on home beta blockers Normal cardiovascular exam     Neuro/Psych Anxiety Depression    GI/Hepatic hiatal hernia, GERD  Medicated and Controlled,  Endo/Other    Renal/GU      Musculoskeletal  (+) Arthritis , Osteoarthritis,    Abdominal   Peds  Hematology   Anesthesia Other Findings   Reproductive/Obstetrics                           Anesthesia Physical Anesthesia Plan  ASA: II  Anesthesia Plan: Spinal and Regional   Post-op Pain Management: MAC Combined w/ Regional for Post-op pain   Induction: Intravenous  Airway Management Planned: Natural Airway  Additional Equipment:   Intra-op Plan:   Post-operative Plan: Extubation in OR  Informed Consent: I have reviewed the patients History and Physical, chart, labs and discussed the procedure including the risks, benefits and alternatives for the proposed anesthesia with the patient or authorized representative who has indicated his/her understanding and acceptance.   Dental advisory given  Plan Discussed with: CRNA, Surgeon and Anesthesiologist  Anesthesia Plan Comments:       Anesthesia Quick Evaluation

## 2015-06-13 NOTE — Transfer of Care (Signed)
Immediate Anesthesia Transfer of Care Note  Patient: Kristen Wilson  Procedure(s) Performed: Procedure(s): LEFT TOTAL KNEE ARTHROPLASTY (Left)  Patient Location: PACU  Anesthesia Type:MAC, Spinal and MAC combined with regional for post-op pain  Level of Consciousness: awake, alert , oriented and patient cooperative  Airway & Oxygen Therapy: Patient Spontanous Breathing and Patient connected to nasal cannula oxygen  Post-op Assessment: Report given to RN and Post -op Vital signs reviewed and stable  Post vital signs: Reviewed and stable  Last Vitals:  Filed Vitals:   06/13/15 0912  BP: 131/72  Pulse: 65  Temp: 36.4 C  Resp: 11    Complications: No apparent anesthesia complications

## 2015-06-13 NOTE — Op Note (Signed)
MRN:     [id number] DOB/AGE:    [birth year] / 64 y.o.       OPERATIVE REPORT    DATE OF PROCEDURE:  06/13/2015       PREOPERATIVE DIAGNOSIS:   PRIMARY LOCALIZED OA  LEFT KNEE      Estimated body mass index is 29.3 kg/(m^2) as calculated from the following:   Height as of this encounter: 5\' 1"  (1.549 m).   Weight as of this encounter: 70.308 kg (155 lb).                                                        POSTOPERATIVE DIAGNOSIS:   SAME                                                                     PROCEDURE:  Procedure(s): LEFT TOTAL KNEE ARTHROPLASTY Using Depuy Attune RP implants #4 narrow Femur, #3Tibia, 28mm attune RP bearing, 29 Patella     SURGEON: Greg Cratty A    ASSISTANT:  Kirstin Shepperson PA-C   (Present and scrubbed throughout the case, critical for assistance with exposure, retraction, instrumentation, and closure.)         ANESTHESIA: Spinal with Femoral Nerve Block  DRAINS: foley, 2 medium hemovac in knee   TOURNIQUET TIME: 24OXB   COMPLICATIONS:  None     SPECIMENS: None   INDICATIONS FOR PROCEDURE: The patient has  DJD LEFT KNEE, varus deformities, XR shows bone on bone arthritis. Patient has failed all conservative measures including anti-inflammatory medicines, narcotics, attempts at  exercise and weight loss, cortisone injections and viscosupplementation.  Risks and benefits of surgery have been discussed, questions answered.   DESCRIPTION OF PROCEDURE: The patient identified by armband, received  right femoral nerve block and IV antibiotics, in the holding area at [facility]. Patient taken to the operating room, appropriate anesthetic  monitors were attached General endotracheal anesthesia induced with  the patient in supine position, Foley catheter was inserted. Tourniquet  applied high to the operative thigh. Lateral post and foot positioner  applied to the table, the lower extremity was then prepped and draped  in usual sterile fashion  from the ankle to the tourniquet. Time-out procedure was performed. The limb was wrapped with an Esmarch bandage and the tourniquet inflated to 365 mmHg. We began the operation by making the anterior midline incision starting at handbreadth above the patella going over the patella 1 cm medial to and  4 cm distal to the tibial tubercle. Small bleeders in the skin and the  subcutaneous tissue identified and cauterized. Transverse retinaculum was incised and reflected medially and a medial parapatellar arthrotomy was accomplished. the patella was everted and theprepatellar fat pad resected. The superficial medial collateral  ligament was then elevated from anterior to posterior along the proximal  flare of the tibia and anterior half of the menisci resected. The knee was hyperflexed exposing bone on bone arthritis. Peripheral and notch osteophytes as well as the cruciate ligaments were then resected. We continued to  work our way around posteriorly along the proximal tibia,  and externally  rotated the tibia subluxing it out from underneath the femur. A McHale  retractor was placed through the notch and a lateral Hohmann retractor  placed, and we then drilled through the proximal tibia in line with the  axis of the tibia followed by an intramedullary guide rod and 2-degree  posterior slope cutting guide. The tibial cutting guide was pinned into place  allowing resection of 4 mm of bone medially and about 6 mm of bone  laterally because of her varus deformity. Satisfied with the tibial resection, we then  entered the distal femur 2 mm anterior to the PCL origin with the  intramedullary guide rod and applied the distal femoral cutting guide  set at 63mm, with 5 degrees of valgus. This was pinned along the  epicondylar axis. At this point, the distal femoral cut was accomplished without difficulty. We then sized for a #4 narrow femoral component and pinned the guide in 3 degrees of external rotation.The  chamfer cutting guide was pinned into place. The anterior, posterior, and chamfer cuts were accomplished without difficulty followed by  the Attune RP box cutting guide and the box cut. We also removed posterior osteophytes from the posterior femoral condyles. At this  time, the knee was brought into full extension. We checked our  extension and flexion gaps and found them symmetric at 54mm.  The patella thickness measured at 20 mm. We set the cutting guide at 12 and removed the posterior 8 mm  of the patella sized for 29 button and drilled the lollipop. The knee  was then once again hyperflexed exposing the proximal tibia. We sized for a #3 tibial base plate, applied the smokestack and the conical reamer followed by the the Delta fin keel punch. We then hammered into place the Attune RP trial femoral component, inserted a 7-mm trial bearing, trial patellar button, and took the knee through range of motion from 0-130 degrees. No thumb pressure was required for patellar  tracking. At this point, all trial components were removed, a double batch of DePuy HV cement  was mixed and applied to all bony metallic mating surfaces except for the posterior condyles of the femur itself. In order, we  hammered into place the tibial tray and removed excess cement, the femoral component and removed excess cement, a 7-mm Attune RP bearing  was inserted, and the knee brought to full extension with compression.  The patellar button was clamped into place, and excess cement  removed. While the cement cured the wound was irrigated out with normal saline solution pulse lavage, and medium Hemovac drains were placed.. Ligament stability and patellar tracking were checked and found to be excellent. The tourniquet was then released and hemostasis was obtained with cautery. The parapatellar arthrotomy was closed with  #1 ethibond suture. The subcutaneous tissue with 0 and 2-0 undyed  Vicryl suture, and 4-0 Monocryl.. A dressing of  Xeroform,  4 x 4, dressing sponges, Webril, and Ace wrap applied. Needle and sponge count were correct times 2.The patient awakened, extubated, and taken to recovery room without difficulty. Vascular status was normal, pulses 2+ and symmetric.   [place] Rose A 06/13/2015, 8:40 AM

## 2015-06-13 NOTE — Care Management (Signed)
Utilization review completed. Rache Klimaszewski, RN Case Manager [phone]. 

## 2015-06-13 NOTE — Progress Notes (Signed)
Orthopedic Tech Progress Note Patient Details:  Kristen Wilson [birth year] [id number]  CPM Left Knee CPM Left Knee: On Left Knee Flexion (Degrees): 90 Left Knee Extension (Degrees): 0 Additional Comments: trapeze bar patient helper Viewed order from doctor's order list  Hildred Priest 06/13/2015, 9:40 AM

## 2015-06-13 NOTE — Progress Notes (Signed)
Pt c/o of foley catheter discomfort and requested for it to be removed

## 2015-06-13 NOTE — Anesthesia Procedure Notes (Addendum)
Anesthesia Regional Block:  Adductor canal block  Pre-Anesthetic Checklist: ,, timeout performed, Correct Patient, Correct Site, Correct Laterality, Correct Procedure, Correct Position, site marked, Risks and benefits discussed,  Surgical consent,  Pre-op evaluation,  At surgeon's request and post-op pain management  Laterality: Left  Prep: chloraprep       Needles:  Injection technique: Single-shot  Needle Type: Echogenic Needle     Needle Length: 9cm 9 cm Needle Gauge: 21 and 21 G    Additional Needles:  Procedures: ultrasound guided (picture in chart) Adductor canal block Narrative:   Performed by: Personally  Anesthesiologist: Catalina Gravel  Additional Notes: No pain on injection. No increased resistance to injection. Injection made in 5cc increments.  Good needle visualization.  Patient tolerated procedure well.   Spinal Patient location during procedure: OR Start time: 06/13/2015 7:20 AM End time: 06/13/2015 7:24 AM Staffing Anesthesiologist: Lillia Abed Performed by: anesthesiologist  Preanesthetic Checklist Completed: patient identified, site marked, surgical consent, pre-op evaluation, timeout performed, IV checked, risks and benefits discussed and monitors and equipment checked Spinal Block Patient position: sitting Prep: Betadine Patient monitoring: heart rate, cardiac monitor, continuous pulse ox and blood pressure Approach: right paramedian Location: L3-4 Injection technique: single-shot Needle Needle type: Pencan  Needle gauge: 24 G Needle length: 9 cm Needle insertion depth: 6 cm Assessment Sensory level: T10  Date/Time: 06/13/2015 7:20 AM Performed by: Carney Living Pre-anesthesia Checklist: Patient identified, Emergency Drugs available, Suction available, Patient being monitored and Timeout performed Patient Re-evaluated:Patient Re-evaluated prior to inductionOxygen Delivery Method: Nasal cannula

## 2015-06-14 ENCOUNTER — Encounter (HOSPITAL_COMMUNITY): Payer: Self-pay | Admitting: Physician Assistant

## 2015-06-14 ENCOUNTER — Inpatient Hospital Stay (HOSPITAL_COMMUNITY): Payer: PPO

## 2015-06-14 DIAGNOSIS — J069 Acute upper respiratory infection, unspecified: Secondary | ICD-10-CM

## 2015-06-14 HISTORY — DX: Acute upper respiratory infection, unspecified: J06.9

## 2015-06-14 LAB — BASIC METABOLIC PANEL
ANION GAP: 10 (ref 5–15)
BUN: 9 mg/dL (ref 6–20)
CO2: 28 mmol/L (ref 22–32)
Calcium: 8.5 mg/dL — ABNORMAL LOW (ref 8.9–10.3)
Chloride: 102 mmol/L (ref 101–111)
Creatinine, Ser: 0.94 mg/dL (ref 0.44–1.00)
GFR calc Af Amer: >60 mL/min (ref >60)
GLUCOSE: 176 mg/dL — AB (ref 65–99)
POTASSIUM: 3.9 mmol/L (ref 3.5–5.1)
Sodium: 140 mmol/L (ref 135–145)

## 2015-06-14 LAB — CBC
HEMATOCRIT: 33.9 % — AB (ref 36.0–46.0)
Hemoglobin: 11.7 g/dL — ABNORMAL LOW (ref 12.0–15.0)
MCH: 31.9 pg (ref 26.0–34.0)
MCHC: 34.5 g/dL (ref 30.0–36.0)
MCV: 92.4 fL (ref 78.0–100.0)
PLATELETS: 164 10*3/uL (ref 150–400)
RBC: 3.67 MIL/uL — ABNORMAL LOW (ref 3.87–5.11)
RDW: 13.2 % (ref 11.5–15.5)
WBC: 18.3 10*3/uL — AB (ref 4.0–10.5)

## 2015-06-14 MED ORDER — IPRATROPIUM-ALBUTEROL 0.5-2.5 (3) MG/3ML IN SOLN
3.0000 mL | Freq: Four times a day (QID) | RESPIRATORY_TRACT | Status: DC
  Administered 2015-06-14: 3 mL via RESPIRATORY_TRACT
  Filled 2015-06-14: qty 3

## 2015-06-14 MED ORDER — ALBUTEROL SULFATE (2.5 MG/3ML) 0.083% IN NEBU: 2.5000 mg | INHALATION_SOLUTION | RESPIRATORY_TRACT | Status: DC | PRN

## 2015-06-14 MED ORDER — CEFTRIAXONE SODIUM 1 G IJ SOLR
1.0000 g | INTRAMUSCULAR | Status: DC
  Administered 2015-06-14 – 2015-06-15 (×2): 1 g via INTRAVENOUS
  Filled 2015-06-14 (×2): qty 10

## 2015-06-14 MED ORDER — BISACODYL 10 MG RE SUPP
10.0000 mg | Freq: Once | RECTAL | Status: AC
  Administered 2015-06-14: 10 mg via RECTAL
  Filled 2015-06-14: qty 1

## 2015-06-14 NOTE — Progress Notes (Signed)
Physical Therapy Progress Note Kristen Wilson continues to progress well with mobility, gait and HEP. Educated for CPM use, stairs and plan. Pt with continues AMS with delayed response, decreased short term memory and processing from baseline. Sats remained 92-94% on RA with activity with HR 64.    06/14/15 1404  PT Visit Information  Last PT Received On 06/14/15  Assistance Needed +1  History of Present Illness Pt admitted for L TKA with PMHx: IBS, depression, GERD  PT Time Calculation  PT Start Time (ACUTE ONLY) 1317  PT Stop Time (ACUTE ONLY) 1401  PT Time Calculation (min) (ACUTE ONLY) 44 min  Precautions  Precautions Knee;Fall  Restrictions  LLE Weight Bearing WBAT  Pain Assessment  Pain Score 5  Pain Location left knee  Pain Descriptors / Indicators Aching  Pain Intervention(s) Repositioned;Limited activity within patient's tolerance;Monitored during session  Cognition  Arousal/Alertness Awake/alert  Behavior During Therapy WFL for tasks assessed/performed  Overall Cognitive Status Impaired/Different from baseline  Area of Impairment Safety/judgement  Memory Decreased short-term memory  Bed Mobility  Overal bed mobility Needs Assistance  Sit to supine Supervision  General bed mobility comments cues for sequence with supervision for safety  Transfers  Overall transfer level Needs assistance  Transfers Sit to/from Stand  Sit to Stand Supervision  General transfer comment cues for hand placement and safety x 3 trials from bed, recliner and  BSC  Ambulation/Gait  Ambulation/Gait assistance Min guard  Ambulation Distance (Feet) 120 Feet  Assistive device Rolling walker (2 wheeled)  Gait Pattern/deviations Step-through pattern;Decreased stride length  General Gait Details cues for posture, looking up and sequence of gait  Gait velocity interpretation Below normal speed for age/gender  Stairs Yes  Stairs assistance Supervision  Stair Management Backwards;With walker  Number of  Stairs 1  General stair comments cues for sequence  Total Joint Exercises  Heel Slides AAROM;Left;15 reps;Supine  Hip ABduction/ADduction AROM;Left;15 reps;Supine  Straight Leg Raises 10 reps;AROM;Left;Supine  Long Arc Quad AROM;Left;15 reps;Seated  PT - End of Session  Activity Tolerance Patient tolerated treatment well  Patient left in bed;in CPM;with call bell/phone within reach;with nursing/sitter in room (0-85 degrees CPM)  Nurse Communication Mobility status  PT - Assessment/Plan  PT Plan Current plan remains appropriate  Follow Up Recommendations Home health PT  PT Goal Progression  Progress towards PT goals Progressing toward goals  PT General Charges  $$ ACUTE PT VISIT 1 Procedure  PT Treatments  $Gait Training 23-37 mins  $Therapeutic Exercise 8-22 mins  Elwyn Reach, [place]

## 2015-06-14 NOTE — Progress Notes (Signed)
PT Cancellation Note  Patient Details Name: Kristen Wilson MRN: [id number] DOB: 1950-11-27   Cancelled Treatment:    Reason Eval/Treat Not Completed: Patient at procedure or test/unavailable (OOR for chest xray)   Lanetta Inch [facility] 06/14/2015, 9:26 AM Elwyn Reach, [place]

## 2015-06-14 NOTE — Progress Notes (Signed)
Subjective: 1 Day Post-Op Procedure(s) (LRB): LEFT TOTAL KNEE ARTHROPLASTY (Left) Patient reports pain as 6 on 0-10 scale.    Objective: Vital signs in last 24 hours: Temp:  [97.6 F (36.4 C)-98.6 F (37 C)] 97.9 F (36.6 C) (10/18 0619) Pulse Rate:  [52-80] 62 (10/18 0619) Resp:  [9-45] 16 (10/18 0700) BP: (110-147)/(50-75) 125/50 mmHg (10/18 0619) SpO2:  [94 %-100 %] 99 % (10/18 0700)  Intake/Output from previous day: 10/17 0701 - 10/18 0700 In: 3249.7 [P.O.:720; I.V.:2529.7] Out: 1250 [Urine:950; Drains:300] Intake/Output this shift:     Recent Labs  06/13/15 1248 06/14/15 0428  HGB 14.4 11.7*    Recent Labs  06/13/15 1248 06/14/15 0428  WBC 10.2 18.3*  RBC 4.62 3.67*  HCT 42.5 33.9*  PLT 168 164    Recent Labs  06/13/15 1248 06/14/15 0428  NA  --  140  K  --  3.9  CL  --  102  CO2  --  28  BUN  --  9  CREATININE 0.96 0.94  GLUCOSE  --  176*  CALCIUM  --  8.5*   No results for input(s): LABPT, INR in the last 72 hours.  ABD soft Neurovascular intact Sensation intact distally Incision: dressing C/D/I  Significant cough and wheezing with leukocytosis.    Assessment/Plan: 1 Day Post-Op Procedure(s) (LRB): LEFT TOTAL KNEE ARTHROPLASTY (Left) Advance diet Up with therapy D/C IV fluids Plan for discharge tomorrow Discharge home with home health  Ordered chest xray, duoneb, albuterol nebulizer prn and rocephin for her upper respiratory tract infection  Kristen Wilson 06/14/2015, 8:18 AM

## 2015-06-14 NOTE — Evaluation (Signed)
Occupational Therapy Evaluation Patient Details Name: Kristen Wilson MRN: [id number] DOB: 1950/10/12 Today's Date: 06/14/2015    History of Present Illness Pt admitted for L TKA with PMHx: IBS, depression, GERD   Clinical Impression   Pt was independent prior to admission.  Presents with expected post op pain, generalized weakness and impaired balance interfering with ability to perform ADL and IADL at her baseline.  Pt is able to access her L foot without AE.  Will need to practice standing activities, toilet transfers and tub transfer next visit.  Pt for chest xray this morning. Educated in use of incentive spirometer and encouraged OOB activity.    Follow Up Recommendations  No OT follow up    Equipment Recommendations  None recommended by OT    Recommendations for Other Services       Precautions / Restrictions Precautions Precautions: Knee Restrictions Weight Bearing Restrictions: Yes LLE Weight Bearing: Weight bearing as tolerated      Mobility Bed Mobility Overal bed mobility: Needs Assistance Bed Mobility: Sit to Supine       Sit to supine: Min guard   General bed mobility comments: able to raise L LE up into bed  Transfers Overall transfer level: Needs assistance Equipment used: Rolling walker (2 wheeled) Transfers: Sit to/from [facility] Sit to Stand: Min assist Stand pivot transfers: Min guard       General transfer comment: cues for hand placement and sequencing, assist to rise and balance    Balance Overall balance assessment: Needs assistance   Sitting balance-Leahy Scale: Good       Standing balance-Leahy Scale: Poor                              ADL Overall ADL's : Needs assistance/impaired Eating/Feeding: Independent;Sitting   Grooming: Wash/dry hands;Wash/dry face;Sitting;Set up   Upper Body Bathing: Set up;Sitting   Lower Body Bathing: Minimal assistance;Sit to/from stand   Upper Body Dressing :  Set up;Sitting   Lower Body Dressing: Minimal assistance;Sit to/from stand Lower Body Dressing Details (indicate cue type and reason): donned underwear Toilet Transfer: Stand-pivot;RW;Min guard   Toileting- Clothing Manipulation and Hygiene: Minimal assistance;Sit to/from stand         General ADL Comments: Pt able to start her underwear over her L foot, assist to balance and pull up.     Vision     Perception     Praxis      Pertinent Vitals/Pain Pain Assessment: Faces Faces Pain Scale: Hurts little more Pain Location: L knee Pain Descriptors / Indicators: Operative site guarding;Grimacing Pain Intervention(s): Premedicated before session;Repositioned;Monitored during session;Limited activity within patient's tolerance     Hand Dominance Right   Extremity/Trunk Assessment Upper Extremity Assessment Upper Extremity Assessment: Overall WFL for tasks assessed   Lower Extremity Assessment Lower Extremity Assessment: Defer to PT evaluation   Cervical / Trunk Assessment Cervical / Trunk Assessment: Normal   Communication Communication Communication: No difficulties   Cognition Arousal/Alertness: Awake/alert Behavior During Therapy: WFL for tasks assessed/performed Overall Cognitive Status: Within Functional Limits for tasks assessed                     General Comments       Exercises       Shoulder Instructions      Home Living Family/patient expects to be discharged to:: Private residence Living Arrangements: Other relatives (sister) Available Help at Discharge: Family;Friend(s);Available PRN/intermittently Type  of Home: House Home Access: Stairs to enter [employer] of Steps: 1   Home Layout: One level     Bathroom Shower/Tub: Teacher, early years/pre: Standard     Home Equipment: Environmental consultant - 2 wheels;Cane - single point;Bedside commode;Shower seat          Prior Functioning/Environment Level of Independence:  Independent             OT Diagnosis: Generalized weakness;Acute pain   OT Problem List: Decreased strength;Decreased activity tolerance;Impaired balance (sitting and/or standing);Decreased knowledge of use of DME or AE;Pain   OT Treatment/Interventions: Self-care/ADL training;DME and/or AE instruction;Balance training;Patient/family education    OT Goals(Current goals can be found in the care plan section) Acute Rehab OT Goals Patient Stated Goal: return home OT Goal Formulation: With patient Time For Goal Achievement: 06/21/15 Potential to Achieve Goals: Good ADL Goals Pt Will Perform Grooming: with supervision;standing Pt Will Perform Lower Body Bathing: with supervision;sit to/from stand Pt Will Perform Lower Body Dressing: with supervision;sit to/from stand Pt Will Transfer to Toilet: with supervision;ambulating;bedside commode (over toilet) Pt Will Perform Toileting - Clothing Manipulation and hygiene: with supervision;sit to/from stand Pt Will Perform Tub/Shower Transfer: Tub transfer;with supervision;ambulating;rolling walker;shower seat  OT Frequency: Min 2X/week   Barriers to D/C:            Co-evaluation              End of Session Equipment Utilized During Treatment: Gait belt;Rolling walker  Activity Tolerance: Patient tolerated treatment well Patient left: in bed;with call bell/phone within reach;with nursing/sitter in room;with family/visitor present (going to xray)   Time: 2536-6440 OT Time Calculation (min): 32 min Charges:  OT General Charges $OT Visit: 1 Procedure OT Evaluation $Initial OT Evaluation Tier I: 1 Procedure OT Treatments $Self Care/Home Management : 8-22 mins G-Codes:    Malka So 06/14/2015, 9:28 AM  [phone]

## 2015-06-14 NOTE — Plan of Care (Signed)
Problem: Consults Goal: Diagnosis- Total Joint Replacement Outcome: Completed/Met Date Met:  06/14/15 Primary Total Knee Left

## 2015-06-14 NOTE — Progress Notes (Signed)
Physical Therapy Treatment Patient Details Name: Kristen Wilson MRN: [id number] DOB: [birth year] Today's Date: 06/14/2015    History of Present Illness Pt admitted for L TKA with PMHx: IBS, depression, GERD    PT Comments    Pt with improved gait and mobility today. Some confusion with difficulty following multistep commands or sequencing. Pt attempting to get to bathroom on her own on arrival, assisted to toilet then performed session. Returned to bed for CPM but pt again had to void to returned to [facility] end of session with call bell and NT aware. Will continue to follow to maximize function and ROM.  Follow Up Recommendations  Home health PT     Equipment Recommendations       Recommendations for Other Services       Precautions / Restrictions Precautions Precautions: Knee;Fall Required Braces or Orthoses: Knee Immobilizer - Left Knee Immobilizer - Left: Discontinue once straight leg raise with < 10 degree lag;On when out of bed or walking Restrictions Weight Bearing Restrictions: Yes LLE Weight Bearing: Weight bearing as tolerated    Mobility  Bed Mobility Overal bed mobility: Needs Assistance Bed Mobility: Supine to Sit;Sit to Supine     Supine to sit: Supervision Sit to supine: Supervision   General bed mobility comments: cues for sequence with pt returning to bed then got back up with need to void  Transfers Overall transfer level: Needs assistance Equipment used: Rolling walker (2 wheeled) Transfers: Sit to/from Stand Sit to Stand: Supervision Stand pivot transfers: Supervision       General transfer comment: cues for hand placement and safety x 4 trials from bed and BSC  Ambulation/Gait Ambulation/Gait assistance: Min guard Ambulation Distance (Feet): 80 Feet Assistive device: Rolling walker (2 wheeled) Gait Pattern/deviations: Step-through pattern;Decreased stride length   Gait velocity interpretation: Below normal speed for age/gender General Gait  Details: cues for posture, looking up and sequence of gait. Pt walked 15' to toilet then 46' with RW and cues   Stairs            Wheelchair Mobility    Modified Rankin (Stroke Patients Only)       Balance Overall balance assessment: Needs assistance   Sitting balance-Leahy Scale: Good       Standing balance-Leahy Scale: Poor                      Cognition Arousal/Alertness: Awake/alert Behavior During Therapy: WFL for tasks assessed/performed Overall Cognitive Status: Impaired/Different from baseline Area of Impairment: Safety/judgement         Safety/Judgement: Decreased awareness of safety     General Comments: pt standing at recliner on arrival trying to get to the bathroom having not called for assist with lines and O2 connected to wall    Exercises Total Joint Exercises Heel Slides: AAROM;Supine;10 reps;Left Hip ABduction/ADduction: AROM;Left;10 reps;Supine Straight Leg Raises: AROM;Left;5 reps;Supine Long Arc Quad: AAROM;Seated;Left;10 reps    General Comments        Pertinent Vitals/Pain Pain Assessment: Faces Faces Pain Scale: Hurts little more Pain Location: L knee Pain Descriptors / Indicators: Operative site guarding;Grimacing Pain Intervention(s): Premedicated before session;Repositioned;Monitored during session;Limited activity within patient's tolerance  94% on 1L     Home Living Family/patient expects to be discharged to:: Private residence Living Arrangements: Other relatives (sister) Available Help at Discharge: Family;Friend(s);Available PRN/intermittently Type of Home: House Home Access: Stairs to enter   Home Layout: One level Home Equipment: Environmental consultant - 2 wheels;Cane - single point;Bedside commode;Shower  seat      Prior Function Level of Independence: Independent          PT Goals (current goals can now be found in the care plan section) Acute Rehab PT Goals Patient Stated Goal: return home Progress towards PT  goals: Progressing toward goals    Frequency       PT Plan Current plan remains appropriate    Co-evaluation             End of Session Equipment Utilized During Treatment: Oxygen Activity Tolerance: Patient tolerated treatment well Patient left: with call bell/phone within reach;Other (comment) (on [facility] with NT aware and call bell in hand)     Time: 4696-2952 PT Time Calculation (min) (ACUTE ONLY): 47 min  Charges:  $Gait Training: 8-22 mins $Therapeutic Exercise: 8-22 mins $Therapeutic Activity: 8-22 mins                    G Codes:      Melford Aase 07-11-2015, 11:11 AM Elwyn Reach, [place]

## 2015-06-14 NOTE — Care Management Note (Signed)
Case Management Note  Patient Details  Name: Kristen Wilson MRN: [id number] Date of Birth: Jan 06, 1951  Subjective/Objective:    64 yr old female s/p left total knee arthroplasty.                Action/Plan:  Patient was preoperatively setup with [place], no changes. Patient has rolling walker. 3in1 and CPM to be delivered to her home. Patient has support at discharge.  Expected Discharge Date:    06/12/15              Expected Discharge Plan:   Home with Home Health PT  In-House Referral:     Discharge planning Services  CM Consult  Post Acute Care Choice:  Durable Medical Equipment, Home Health Choice offered to:  Patient  DME Arranged:  3-N-1, CPM DME Agency:  [employer]  HH Arranged:  PT [place] Agency:  [place]  Status of Service:  Completed, signed off  Medicare Important Message Given:    Date Medicare IM Given:    Medicare IM give by:    Date Additional Medicare IM Given:    Additional Medicare Important Message give by:     If discussed at [place] of Stay Meetings, dates discussed:    Additional Comments:  Ninfa Meeker, RN 06/14/2015, 4:05 PM

## 2015-06-15 ENCOUNTER — Encounter (HOSPITAL_COMMUNITY): Payer: Self-pay | Admitting: General Practice

## 2015-06-15 LAB — BASIC METABOLIC PANEL
ANION GAP: 9 (ref 5–15)
BUN: 11 mg/dL (ref 6–20)
CHLORIDE: 103 mmol/L (ref 101–111)
CO2: 31 mmol/L (ref 22–32)
Calcium: 8.7 mg/dL — ABNORMAL LOW (ref 8.9–10.3)
Creatinine, Ser: 0.77 mg/dL (ref 0.44–1.00)
GFR calc Af Amer: >60 mL/min (ref >60)
GLUCOSE: 125 mg/dL — AB (ref 65–99)
POTASSIUM: 3.8 mmol/L (ref 3.5–5.1)
Sodium: 143 mmol/L (ref 135–145)

## 2015-06-15 LAB — CBC
HEMATOCRIT: 32.4 % — AB (ref 36.0–46.0)
HEMOGLOBIN: 10.8 g/dL — AB (ref 12.0–15.0)
MCH: 31.1 pg (ref 26.0–34.0)
MCHC: 33.3 g/dL (ref 30.0–36.0)
MCV: 93.4 fL (ref 78.0–100.0)
Platelets: 168 10*3/uL (ref 150–400)
RBC: 3.47 MIL/uL — AB (ref 3.87–5.11)
RDW: 13.5 % (ref 11.5–15.5)
WBC: 15.9 10*3/uL — AB (ref 4.0–10.5)

## 2015-06-15 MED ORDER — CEFUROXIME AXETIL 500 MG PO TABS: 500.0000 mg | ORAL_TABLET | Freq: Two times a day (BID) | ORAL | Status: DC

## 2015-06-15 MED ORDER — IPRATROPIUM-ALBUTEROL 0.5-2.5 (3) MG/3ML IN SOLN
3.0000 mL | Freq: Four times a day (QID) | RESPIRATORY_TRACT | Status: DC
  Administered 2015-06-15: 3 mL via RESPIRATORY_TRACT
  Filled 2015-06-15 (×2): qty 3

## 2015-06-15 MED ORDER — ALBUTEROL SULFATE HFA 108 (90 BASE) MCG/ACT IN AERS: 2.0000 | INHALATION_SPRAY | Freq: Four times a day (QID) | RESPIRATORY_TRACT | Status: DC | PRN

## 2015-06-15 MED ORDER — HYDROMORPHONE HCL 2 MG PO TABS: ORAL_TABLET | ORAL | Status: DC

## 2015-06-15 MED ORDER — DOCUSATE SODIUM 100 MG PO CAPS: ORAL_CAPSULE | ORAL | Status: DC

## 2015-06-15 MED ORDER — IPRATROPIUM-ALBUTEROL 0.5-2.5 (3) MG/3ML IN SOLN: 3.0000 mL | Freq: Four times a day (QID) | RESPIRATORY_TRACT | Status: DC

## 2015-06-15 MED ORDER — ENOXAPARIN SODIUM 30 MG/0.3ML ~~LOC~~ SOLN: 30.0000 mg | Freq: Two times a day (BID) | SUBCUTANEOUS | Status: DC

## 2015-06-15 MED ORDER — POLYETHYLENE GLYCOL 3350 17 G PO PACK: PACK | ORAL | Status: DC

## 2015-06-15 NOTE — Discharge Summary (Signed)
Patient ID: Kristen Wilson MRN: [id number] DOB/AGE: February 01, 1951 64 y.o.  Admit date: 06/13/2015 Discharge date: 06/15/2015  Admission Diagnoses:  Principal Problem:   Primary localized osteoarthritis of left knee Active Problems:   Hyperlipidemia   Hypertension   Hiatal hernia   Depression   GERD (gastroesophageal reflux disease)   IBS (irritable bowel syndrome)   DJD (degenerative joint disease) of knee   Upper respiratory tract infection   Discharge Diagnoses:  Same  Past Medical History  Diagnosis Date  . Hypertension   . Hyperlipidemia   . Hiatal hernia   . Arthritis     "Whole body"  . Allergy   . Depression   . GERD (gastroesophageal reflux disease)   . Anxiety   . Osteoporosis   . IBS (irritable bowel syndrome)   . Diverticulitis   . Primary localized osteoarthritis of left knee   . Anginal pain (HCC)     hx- non recent  . Cough     smoker s cough  . Shortness of breath     occ  . Pneumonia     hx  . Upper respiratory tract infection 06/14/2015    Starting IV Rocephin today also starting nebulizers for wheezing    Surgeries: Procedure(s): LEFT TOTAL KNEE ARTHROPLASTY on 06/13/2015   Consultants:    Discharged Condition: Improved  Hospital Course: Kristen Wilson is an 64 y.o. female who was admitted 06/13/2015 for operative treatment ofPrimary localized osteoarthritis of left knee. Patient has severe unremitting pain that affects sleep, daily activities, and work/hobbies. After pre-op clearance the patient was taken to the operating room on 06/13/2015 and underwent  Procedure(s): LEFT TOTAL KNEE ARTHROPLASTY.    Patient was given perioperative antibiotics: Anti-infectives    Start     Dose/Rate Route Frequency Ordered Stop   06/15/15 0000  cefUROXime (CEFTIN) 500 MG tablet     500 mg Oral 2 times daily with meals 06/15/15 1042     06/14/15 0800  cefTRIAXone (ROCEPHIN) 1 g in dextrose 5 % 50 mL IVPB     1 g 100 mL/hr over 30 Minutes  Intravenous Every 24 hours 06/14/15 0750     06/13/15 1300  ceFAZolin (ANCEF) IVPB 2 g/50 mL premix     2 g 100 mL/hr over 30 Minutes Intravenous Every 6 hours 06/13/15 1125 06/13/15 1833   06/13/15 0600  ceFAZolin (ANCEF) IVPB 2 g/50 mL premix     2 g 100 mL/hr over 30 Minutes Intravenous On call to O.R. 06/12/15 1308 06/13/15 0728       Patient was given sequential compression devices, early ambulation, and chemoprophylaxis to prevent DVT.  Patient benefited maximally from hospital stay and there were no complications.    Recent vital signs: Patient Vitals for the past 24 hrs:  BP Temp Temp src Pulse Resp SpO2  06/15/15 0850 - - - - - 91 %  06/15/15 0500 (!) 142/67 mmHg 98.1 F (36.7 C) Oral 63 20 97 %  06/14/15 2039 (!) 133/52 mmHg 98.1 F (36.7 C) Oral 79 18 93 %  06/14/15 1403 - - - 64 - 94 %  06/14/15 1359 (!) 105/51 mmHg 98.4 F (36.9 C) Oral (!) 58 - 90 %     Recent laboratory studies:  Recent Labs  06/14/15 0428 06/15/15 0323  WBC 18.3* 15.9*  HGB 11.7* 10.8*  HCT 33.9* 32.4*  PLT 164 168  NA 140 143  K 3.9 3.8  CL 102 103  CO2 28 31  BUN 9 11  CREATININE 0.94 0.77  GLUCOSE 176* 125*  CALCIUM 8.5* 8.7*     Discharge Medications:     Medication List    STOP taking these medications        aspirin 81 MG tablet     HYDROcodone-acetaminophen 5-325 MG tablet  Commonly known as:  NORCO/VICODIN      TAKE these medications        acetaminophen 650 MG CR tablet  Commonly known as:  TYLENOL  Take 1,300 mg by mouth 2 (two) times daily.     albuterol 108 (90 BASE) MCG/ACT inhaler  Commonly known as:  PROVENTIL HFA;VENTOLIN HFA  Inhale 2 puffs into the lungs every 6 (six) hours as needed for wheezing or shortness of breath.     cefUROXime 500 MG tablet  Commonly known as:  CEFTIN  Take 1 tablet (500 mg total) by mouth 2 (two) times daily with a meal.     clonazePAM 0.5 MG tablet  Commonly known as:  KLONOPIN  Take 0.5 mg by mouth 2 (two) times  daily.     dicyclomine 20 MG tablet  Commonly known as:  BENTYL  Take 20 mg by mouth 2 (two) times daily.     docusate sodium 100 MG capsule  Commonly known as:  COLACE  1 tab 2 times a day while on narcotics.  STOOL SOFTENER     DULoxetine 60 MG capsule  Commonly known as:  CYMBALTA  Take 60 mg by mouth at bedtime.     enoxaparin 30 MG/0.3ML injection  Commonly known as:  LOVENOX  Inject 0.3 mLs (30 mg total) into the skin every 12 (twelve) hours.     hydrochlorothiazide 25 MG tablet  Commonly known as:  HYDRODIURIL  Take 25 mg by mouth every morning.     HYDROmorphone 2 MG tablet  Commonly known as:  DILAUDID  1-2 tablets every 4-6 hrs as needed for pain     lisinopril 40 MG tablet  Commonly known as:  PRINIVIL,ZESTRIL  Take 40 mg by mouth daily.     metoprolol 50 MG tablet  Commonly known as:  LOPRESSOR  Take 50 mg by mouth 2 (two) times daily.     pantoprazole 40 MG tablet  Commonly known as:  PROTONIX  Take 40 mg by mouth 2 (two) times daily.     polyethylene glycol packet  Commonly known as:  MIRALAX / GLYCOLAX  17grams in 16 oz of water twice a day until bowel movement.  LAXITIVE.  Restart if two days since last bowel movement     simvastatin 20 MG tablet  Commonly known as:  ZOCOR  Take 20 mg by mouth daily.        Diagnostic Studies: Dg Chest 2 View  06/14/2015  CLINICAL DATA:  Shortness of breath.  Cough. EXAM: CHEST  2 VIEW COMPARISON:  November 09, 2014. FINDINGS: The heart size and mediastinal contours are within normal limits. Both lungs are clear. No pneumothorax or pleural effusion is noted. Multilevel degenerative disc disease is noted in upper thoracic spine. IMPRESSION: No active cardiopulmonary disease. Electronically Signed   By: Marijo Conception, M.D.   On: 06/14/2015 09:37    Disposition: 01-Home or Self Care      Discharge Instructions    CPM    Complete by:  As directed   Continuous passive motion machine (CPM):      Use the CPM from 0  to 90 for 6 hours  per day.       You may break it up into 2 or 3 sessions per day.      Use CPM for 2 weeks or until you are told to stop.     Call MD / Call 911    Complete by:  As directed   If you experience chest pain or shortness of breath, CALL 911 and be transported to the hospital emergency room.  If you develope a fever above 101 F, pus (white drainage) or increased drainage or redness at the wound, or calf pain, call your surgeon's office.     Change dressing    Complete by:  As directed   Change the gauze dressing daily with sterile 4 x 4 inch gauze and apply TED hose.  DO NOT REMOVE BANDAGE OVER SURGICAL INCISION.  [place] WHOLE LEG INCLUDING OVER THE WATERPROOF BANDAGE WITH SOAP AND WATER EVERY DAY.     Constipation Prevention    Complete by:  As directed   Drink plenty of fluids.  Prune juice may be helpful.  You may use a stool softener, such as Colace (over the counter) 100 mg twice a day.  Use MiraLax (over the counter) for constipation as needed.     Diet - low sodium heart healthy    Complete by:  As directed      Discharge instructions    Complete by:  As directed   INSTRUCTIONS AFTER JOINT REPLACEMENT   Remove items at home which could result in a fall. This includes throw rugs or furniture in walking pathways ICE to the affected joint every three hours while awake for 30 minutes at a time, for at least the first 3-5 days, and then as needed for pain and swelling.  Continue to use ice for pain and swelling. You may notice swelling that will progress down to the foot and ankle.  This is normal after surgery.  Elevate your leg when you are not up walking on it.   Continue to use the breathing machine you got in the hospital (incentive spirometer) which will help keep your temperature down.  It is common for your temperature to cycle up and down following surgery, especially at night when you are not up moving around and exerting yourself.  The breathing machine keeps your lungs  expanded and your temperature down.   DIET:  As you were doing prior to hospitalization, we recommend a well-balanced diet.  DRESSING / WOUND CARE / SHOWERING  Keep the surgical dressing until follow up.  The dressing is water proof, so you can shower without any extra covering.  IF THE DRESSING FALLS OFF or the wound gets wet inside, change the dressing with sterile gauze.  Please use good hand washing techniques before changing the dressing.  Do not use any lotions or creams on the incision until instructed by your surgeon.    ACTIVITY  Increase activity slowly as tolerated, but follow the weight bearing instructions below.   No driving for 6 weeks or until further direction given by your physician.  You cannot drive while taking narcotics.  No lifting or carrying greater than 10 lbs. until further directed by your surgeon. Avoid periods of inactivity such as sitting longer than an hour when not asleep. This helps prevent blood clots.  You may return to work once you are authorized by your doctor.     WEIGHT BEARING   Weight bearing as tolerated with assist device (walker, cane, etc) as directed,  use it as long as suggested by your surgeon or therapist, typically at least 2-3 weeks.   EXERCISES  Results after joint replacement surgery are often greatly improved when you follow the exercise, range of motion and muscle strengthening exercises prescribed by your doctor. Safety measures are also important to protect the joint from further injury. Any time any of these exercises cause you to have increased pain or swelling, decrease what you are doing until you are comfortable again and then slowly increase them. If you have problems or questions, call your caregiver or physical therapist for advice.   Rehabilitation is important following a joint replacement. After just a few days of immobilization, the muscles of the leg can become weakened and shrink (atrophy).  These exercises are  designed to build up the tone and strength of the thigh and leg muscles and to improve motion. Often times heat used for twenty to thirty minutes before working out will loosen up your tissues and help with improving the range of motion but do not use heat for the first two weeks following surgery (sometimes heat can increase post-operative swelling).   These exercises can be done on a training (exercise) mat, on the floor, on a table or on a bed. Use whatever works the best and is most comfortable for you.    Use music or television while you are exercising so that the exercises are a pleasant break in your day. This will make your life better with the exercises acting as a break in your routine that you can look forward to.   Perform all exercises about fifteen times, three times per day or as directed.  You should exercise both the operative leg and the other leg as well.   Exercises include:   Quad Sets - Tighten up the muscle on the front of the thigh (Quad) and hold for 5-10 seconds.   Straight Leg Raises - With your knee straight (if you were given a brace, keep it on), lift the leg to 60 degrees, hold for 3 seconds, and slowly lower the leg.  Perform this exercise against resistance later as your leg gets stronger.  Leg Slides: Lying on your back, slowly slide your foot toward your buttocks, bending your knee up off the floor (only go as far as is comfortable). Then slowly slide your foot back down until your leg is flat on the floor again.  Angel Wings: Lying on your back spread your legs to the side as far apart as you can without causing discomfort.  Hamstring Strength:  Lying on your back, push your heel against the floor with your leg straight by tightening up the muscles of your buttocks.  Repeat, but this time bend your knee to a comfortable angle, and push your heel against the floor.  You may put a pillow under the heel to make it more comfortable if necessary.   A rehabilitation program  following joint replacement surgery can speed recovery and prevent re-injury in the future due to weakened muscles. Contact your doctor or a physical therapist for more information on knee rehabilitation.    CONSTIPATION  Constipation is defined medically as fewer than three stools per week and severe constipation as less than one stool per week.  Even if you have a regular bowel pattern at home, your normal regimen is likely to be disrupted due to multiple reasons following surgery.  Combination of anesthesia, postoperative narcotics, change in appetite and fluid intake all can affect your  bowels.   YOU MUST use at least one of the following options; they are listed in order of increasing strength to get the job done.  They are all available over the counter, and you may need to use some, POSSIBLY even all of these options:    Drink plenty of fluids (prune juice may be helpful) and high fiber foods Colace 100 mg by mouth twice a day  Senokot for constipation as directed and as needed Dulcolax (bisacodyl), take with full glass of water  Miralax (polyethylene glycol) once or twice a day as needed.  If you have tried all these things and are unable to have a bowel movement in the first 3-4 days after surgery call either your surgeon or your primary doctor.    If you experience loose stools or diarrhea, hold the medications until you stool forms back up.  If your symptoms do not get better within 1 week or if they get worse, check with your doctor.  If you experience "the worst abdominal pain ever" or develop nausea or vomiting, please contact the office immediately for further recommendations for treatment.   ITCHING:  If you experience itching with your medications, try taking only a single pain pill, or even half a pain pill at a time.  You can also use Benadryl over the counter for itching or also to help with sleep.   TED HOSE STOCKINGS:  Use stockings on both legs until for at least 2 weeks  or as directed by physician office. They may be removed at night for sleeping.  MEDICATIONS:  See your medication summary on the "After Visit Summary" that nursing will review with you.  You may have some home medications which will be placed on hold until you complete the course of blood thinner medication.  It is important for you to complete the blood thinner medication as prescribed.  PRECAUTIONS:  If you experience chest pain or shortness of breath - call 911 immediately for transfer to the hospital emergency department.   If you develop a fever greater that 101 F, purulent drainage from wound, increased redness or drainage from wound, foul odor from the wound/dressing, or calf pain - CONTACT YOUR SURGEON.                                                   FOLLOW-UP APPOINTMENTS:  If you do not already have a post-op appointment, please call the office for an appointment to be seen by your surgeon.  Guidelines for how soon to be seen are listed in your "After Visit Summary", but are typically between 1-4 weeks after surgery.  OTHER INSTRUCTIONS:   Knee Replacement:  Do not place pillow under knee, focus on keeping the knee straight while resting. CPM instructions: 0-90 degrees, 2 hours in the morning, 2 hours in the afternoon, and 2 hours in the evening. Place foam block, curve side up under heel at all times except when in CPM or when walking.  DO NOT modify, tear, cut, or change the foam block in any way.  MAKE SURE YOU:  Understand these instructions.  Get help right away if you are not doing well or get worse.    Thank you for letting us be a part of your medical care team.  It is a privilege we respect greatly.  We hope  these instructions will help you stay on track for a fast and full recovery!     Do not put a pillow under the knee. Place it under the heel.    Complete by:  As directed   Place gray foam block, curve side up under heel at all times except when in CPM or when walking.   DO NOT modify, tear, cut, or change in any way the gray foam block.     Increase activity slowly as tolerated    Complete by:  As directed      TED hose    Complete by:  As directed   Use stockings (TED hose) for 2 weeks on both leg(s).  You may remove them at night for sleeping.           Follow-up Information    Follow up with Lorn Junes, MD On 06/27/2015.   Specialty:  Orthopedic Surgery   Why:  APPT TIME 2 PM   Contact information:   [place] [address] [phone]       Follow up with [facility].   Why:  Someone from [facility] will contact you concerning start date and time for therapy.   Contact information:   [address] [place] [address] [phone]        Signed: Linda Hedges 06/15/2015, 10:44 AM

## 2015-06-15 NOTE — Progress Notes (Signed)
Physical Therapy Treatment Patient Details Name: Kristen Wilson MRN: [id number] DOB: [birth year] Today's Date: 06/15/2015    History of Present Illness Pt admitted for L TKA with PMHx: IBS, depression, GERD    PT Comments    Patient is making gradual progress with PT.  From a mobility standpoint anticipate patient will be ready for DC home with family assistance. Patient denies any questions or concerns following session. Recommending HHPT upon D/C.      Follow Up Recommendations  Home health PT     Equipment Recommendations  None recommended by PT    Recommendations for Other Services       Precautions / Restrictions Precautions Precautions: Knee Restrictions Weight Bearing Restrictions: Yes LLE Weight Bearing: Weight bearing as tolerated    Mobility  Bed Mobility               General bed mobility comments: up in chair upon arrival  Transfers Overall transfer level: Needs assistance Equipment used: Rolling walker (2 wheeled) Transfers: Sit to/from Stand Sit to Stand: Supervision         General transfer comment: cues for hand placement with standing, none needed with sitting  Ambulation/Gait Ambulation/Gait assistance: Supervision Ambulation Distance (Feet): 125 Feet Assistive device: Rolling walker (2 wheeled) Gait Pattern/deviations: Step-through pattern;Decreased weight shift to left Gait velocity: decreased   General Gait Details: cues for even strides and posture   Stairs Stairs: Yes Stairs assistance: Supervision Stair Management: Backwards Number of Stairs: 1 General stair comments: cues for sequence, patient confirms having only 1 step to enter home.   Wheelchair Mobility    Modified Rankin (Stroke Patients Only)       Balance Overall balance assessment: Needs assistance Sitting-balance support: No upper extremity supported Sitting balance-Leahy Scale: Good     Standing balance support: During functional activity Standing  balance-Leahy Scale: Fair Standing balance comment: using rw                    Cognition Arousal/Alertness: Awake/alert Behavior During Therapy: WFL for tasks assessed/performed Overall Cognitive Status: Within Functional Limits for tasks assessed                      Exercises Total Joint Exercises Ankle Circles/Pumps: AROM;Both;10 reps Quad Sets: Left;Strengthening;10 reps Heel Slides: AAROM;Left;10 reps Goniometric ROM: 80 degrees flexion    General Comments        Pertinent Vitals/Pain Pain Assessment: 0-10 Pain Score: 5  Pain Location: Lt knee Pain Descriptors / Indicators: Aching Pain Intervention(s): Limited activity within patient's tolerance;Monitored during session    Home Living Family/patient expects to be discharged to:: Private residence Living Arrangements: Other relatives                  Prior Function            PT Goals (current goals can now be found in the care plan section) Acute Rehab PT Goals Patient Stated Goal: go home PT Goal Formulation: With patient Time For Goal Achievement: 06/27/15 Potential to Achieve Goals: Good Progress towards PT goals: Progressing toward goals    Frequency  7X/week    PT Plan Current plan remains appropriate    Co-evaluation             End of Session Equipment Utilized During Treatment: Gait belt Activity Tolerance: Patient tolerated treatment well Patient left: in chair;with call bell/phone within reach;with chair alarm set;Other (comment) (with PA-C)     Time: 2025-4270  PT Time Calculation (min) (ACUTE ONLY): 30 min  Charges:  $Gait Training: 8-22 mins $Therapeutic Exercise: 8-22 mins                    G Codes:      Cassell Clement, PT, CSCS Pager [phone] Office [phone]  06/15/2015, 11:42 AM

## 2015-06-15 NOTE — Progress Notes (Signed)
OT Cancellation Note  Patient Details Name: CHARLIE CHAR MRN: [id number] DOB: Jun 29, 1951   Cancelled Treatment:    Reason Eval/Treat Not Completed: Other (comment) (pt waiting on medication so she can eat lunch). RN notified that pt would like medication prior to lunch. Pt reports that she has been getting to and from the bathroom and on and off the toilet with supervision. Will check back as time allows.   Binnie Kand M.S., OTR/L Pager: [phone]  06/15/2015, 12:00 PM

## 2015-10-11 DIAGNOSIS — S8002XA Contusion of left knee, initial encounter: Secondary | ICD-10-CM | POA: Diagnosis not present

## 2015-10-13 DIAGNOSIS — Z0000 Encounter for general adult medical examination without abnormal findings: Secondary | ICD-10-CM | POA: Diagnosis not present

## 2015-10-13 DIAGNOSIS — I10 Essential (primary) hypertension: Secondary | ICD-10-CM | POA: Diagnosis not present

## 2015-10-13 DIAGNOSIS — F17210 Nicotine dependence, cigarettes, uncomplicated: Secondary | ICD-10-CM | POA: Diagnosis not present

## 2015-10-13 DIAGNOSIS — N959 Unspecified menopausal and perimenopausal disorder: Secondary | ICD-10-CM | POA: Diagnosis not present

## 2015-10-13 DIAGNOSIS — Z1239 Encounter for other screening for malignant neoplasm of breast: Secondary | ICD-10-CM | POA: Diagnosis not present

## 2015-10-13 DIAGNOSIS — F419 Anxiety disorder, unspecified: Secondary | ICD-10-CM | POA: Diagnosis not present

## 2015-10-13 DIAGNOSIS — Z7189 Other specified counseling: Secondary | ICD-10-CM | POA: Diagnosis not present

## 2015-10-13 DIAGNOSIS — Z79899 Other long term (current) drug therapy: Secondary | ICD-10-CM | POA: Diagnosis not present

## 2015-10-13 DIAGNOSIS — F339 Major depressive disorder, recurrent, unspecified: Secondary | ICD-10-CM | POA: Diagnosis not present

## 2015-10-13 DIAGNOSIS — E782 Mixed hyperlipidemia: Secondary | ICD-10-CM | POA: Diagnosis not present

## 2015-10-13 DIAGNOSIS — K580 Irritable bowel syndrome with diarrhea: Secondary | ICD-10-CM | POA: Diagnosis not present

## 2015-10-13 DIAGNOSIS — K219 Gastro-esophageal reflux disease without esophagitis: Secondary | ICD-10-CM | POA: Diagnosis not present

## 2015-10-25 DIAGNOSIS — E2839 Other primary ovarian failure: Secondary | ICD-10-CM | POA: Diagnosis not present

## 2015-10-25 DIAGNOSIS — M8589 Other specified disorders of bone density and structure, multiple sites: Secondary | ICD-10-CM | POA: Diagnosis not present

## 2015-11-28 DIAGNOSIS — Z1231 Encounter for screening mammogram for malignant neoplasm of breast: Secondary | ICD-10-CM | POA: Diagnosis not present

## 2015-11-28 DIAGNOSIS — Z803 Family history of malignant neoplasm of breast: Secondary | ICD-10-CM | POA: Diagnosis not present

## 2015-12-15 DIAGNOSIS — M85852 Other specified disorders of bone density and structure, left thigh: Secondary | ICD-10-CM | POA: Diagnosis not present

## 2016-01-27 DIAGNOSIS — J441 Chronic obstructive pulmonary disease with (acute) exacerbation: Secondary | ICD-10-CM | POA: Diagnosis not present

## 2016-03-27 DIAGNOSIS — K219 Gastro-esophageal reflux disease without esophagitis: Secondary | ICD-10-CM | POA: Diagnosis not present

## 2016-04-18 DIAGNOSIS — K219 Gastro-esophageal reflux disease without esophagitis: Secondary | ICD-10-CM | POA: Diagnosis not present

## 2016-04-18 DIAGNOSIS — E782 Mixed hyperlipidemia: Secondary | ICD-10-CM | POA: Diagnosis not present

## 2016-04-18 DIAGNOSIS — I10 Essential (primary) hypertension: Secondary | ICD-10-CM | POA: Diagnosis not present

## 2016-04-18 DIAGNOSIS — Z23 Encounter for immunization: Secondary | ICD-10-CM | POA: Diagnosis not present

## 2016-04-18 DIAGNOSIS — F419 Anxiety disorder, unspecified: Secondary | ICD-10-CM | POA: Diagnosis not present

## 2016-04-18 DIAGNOSIS — G479 Sleep disorder, unspecified: Secondary | ICD-10-CM | POA: Diagnosis not present

## 2016-04-18 DIAGNOSIS — M85852 Other specified disorders of bone density and structure, left thigh: Secondary | ICD-10-CM | POA: Diagnosis not present

## 2016-04-18 DIAGNOSIS — K580 Irritable bowel syndrome with diarrhea: Secondary | ICD-10-CM | POA: Diagnosis not present

## 2016-04-18 DIAGNOSIS — F3340 Major depressive disorder, recurrent, in remission, unspecified: Secondary | ICD-10-CM | POA: Diagnosis not present

## 2016-04-18 DIAGNOSIS — J410 Simple chronic bronchitis: Secondary | ICD-10-CM | POA: Diagnosis not present

## 2016-04-18 DIAGNOSIS — M940 Chondrocostal junction syndrome [Tietze]: Secondary | ICD-10-CM | POA: Diagnosis not present

## 2016-04-18 DIAGNOSIS — M25561 Pain in right knee: Secondary | ICD-10-CM | POA: Diagnosis not present

## 2016-05-25 DIAGNOSIS — R109 Unspecified abdominal pain: Secondary | ICD-10-CM | POA: Diagnosis not present

## 2016-08-14 DIAGNOSIS — H40033 Anatomical narrow angle, bilateral: Secondary | ICD-10-CM | POA: Diagnosis not present

## 2016-08-14 DIAGNOSIS — H2513 Age-related nuclear cataract, bilateral: Secondary | ICD-10-CM | POA: Diagnosis not present

## 2016-11-13 DIAGNOSIS — K1121 Acute sialoadenitis: Secondary | ICD-10-CM | POA: Diagnosis not present

## 2016-11-16 DIAGNOSIS — K1121 Acute sialoadenitis: Secondary | ICD-10-CM | POA: Diagnosis not present

## 2016-12-12 DIAGNOSIS — K08109 Complete loss of teeth, unspecified cause, unspecified class: Secondary | ICD-10-CM | POA: Diagnosis not present

## 2017-01-03 DIAGNOSIS — Z803 Family history of malignant neoplasm of breast: Secondary | ICD-10-CM | POA: Diagnosis not present

## 2017-01-03 DIAGNOSIS — Z1231 Encounter for screening mammogram for malignant neoplasm of breast: Secondary | ICD-10-CM | POA: Diagnosis not present

## 2017-01-25 ENCOUNTER — Other Ambulatory Visit (HOSPITAL_COMMUNITY)
Admission: RE | Admit: 2017-01-25 | Discharge: 2017-01-25 | Disposition: A | Discharge status: Home / Self Care | Payer: PPO | Source: Ambulatory Visit | Attending: Family Medicine | Admitting: Family Medicine

## 2017-01-25 ENCOUNTER — Other Ambulatory Visit: Payer: Self-pay | Admitting: Family Medicine

## 2017-01-25 DIAGNOSIS — R296 Repeated falls: Secondary | ICD-10-CM | POA: Diagnosis not present

## 2017-01-25 DIAGNOSIS — F419 Anxiety disorder, unspecified: Secondary | ICD-10-CM | POA: Diagnosis not present

## 2017-01-25 DIAGNOSIS — F339 Major depressive disorder, recurrent, unspecified: Secondary | ICD-10-CM | POA: Diagnosis not present

## 2017-01-25 DIAGNOSIS — Z0000 Encounter for general adult medical examination without abnormal findings: Secondary | ICD-10-CM | POA: Diagnosis not present

## 2017-01-25 DIAGNOSIS — F17210 Nicotine dependence, cigarettes, uncomplicated: Secondary | ICD-10-CM | POA: Diagnosis not present

## 2017-01-25 DIAGNOSIS — E782 Mixed hyperlipidemia: Secondary | ICD-10-CM | POA: Diagnosis not present

## 2017-01-25 DIAGNOSIS — I10 Essential (primary) hypertension: Secondary | ICD-10-CM | POA: Diagnosis not present

## 2017-01-25 DIAGNOSIS — Z124 Encounter for screening for malignant neoplasm of cervix: Secondary | ICD-10-CM | POA: Diagnosis not present

## 2017-01-25 DIAGNOSIS — J410 Simple chronic bronchitis: Secondary | ICD-10-CM | POA: Diagnosis not present

## 2017-01-25 DIAGNOSIS — M85852 Other specified disorders of bone density and structure, left thigh: Secondary | ICD-10-CM | POA: Diagnosis not present

## 2017-01-25 DIAGNOSIS — K580 Irritable bowel syndrome with diarrhea: Secondary | ICD-10-CM | POA: Diagnosis not present

## 2017-01-25 DIAGNOSIS — K219 Gastro-esophageal reflux disease without esophagitis: Secondary | ICD-10-CM | POA: Diagnosis not present

## 2017-01-31 LAB — CYTOLOGY - PAP: Diagnosis: NEGATIVE

## 2017-06-13 DIAGNOSIS — R0789 Other chest pain: Secondary | ICD-10-CM | POA: Diagnosis not present

## 2017-06-13 DIAGNOSIS — Z23 Encounter for immunization: Secondary | ICD-10-CM | POA: Diagnosis not present

## 2017-08-23 DIAGNOSIS — G479 Sleep disorder, unspecified: Secondary | ICD-10-CM | POA: Diagnosis not present

## 2017-08-23 DIAGNOSIS — M85852 Other specified disorders of bone density and structure, left thigh: Secondary | ICD-10-CM | POA: Diagnosis not present

## 2017-08-23 DIAGNOSIS — F17210 Nicotine dependence, cigarettes, uncomplicated: Secondary | ICD-10-CM | POA: Diagnosis not present

## 2017-08-23 DIAGNOSIS — J449 Chronic obstructive pulmonary disease, unspecified: Secondary | ICD-10-CM | POA: Diagnosis not present

## 2017-08-23 DIAGNOSIS — F419 Anxiety disorder, unspecified: Secondary | ICD-10-CM | POA: Diagnosis not present

## 2017-08-23 DIAGNOSIS — Z79899 Other long term (current) drug therapy: Secondary | ICD-10-CM | POA: Diagnosis not present

## 2017-08-23 DIAGNOSIS — E782 Mixed hyperlipidemia: Secondary | ICD-10-CM | POA: Diagnosis not present

## 2017-08-23 DIAGNOSIS — I10 Essential (primary) hypertension: Secondary | ICD-10-CM | POA: Diagnosis not present

## 2017-08-23 DIAGNOSIS — F339 Major depressive disorder, recurrent, unspecified: Secondary | ICD-10-CM | POA: Diagnosis not present

## 2017-08-23 DIAGNOSIS — K580 Irritable bowel syndrome with diarrhea: Secondary | ICD-10-CM | POA: Diagnosis not present

## 2017-08-23 DIAGNOSIS — Z23 Encounter for immunization: Secondary | ICD-10-CM | POA: Diagnosis not present

## 2017-08-23 DIAGNOSIS — K219 Gastro-esophageal reflux disease without esophagitis: Secondary | ICD-10-CM | POA: Diagnosis not present

## 2017-11-19 DIAGNOSIS — D225 Melanocytic nevi of trunk: Secondary | ICD-10-CM | POA: Diagnosis not present

## 2017-11-19 DIAGNOSIS — L578 Other skin changes due to chronic exposure to nonionizing radiation: Secondary | ICD-10-CM | POA: Diagnosis not present

## 2017-11-19 DIAGNOSIS — L720 Epidermal cyst: Secondary | ICD-10-CM | POA: Diagnosis not present

## 2017-11-19 DIAGNOSIS — L821 Other seborrheic keratosis: Secondary | ICD-10-CM | POA: Diagnosis not present

## 2017-12-14 LAB — GLUCOSE, POCT (MANUAL RESULT ENTRY): POC Glucose: 101 mg/dl — AB (ref 70–99)

## 2017-12-25 DIAGNOSIS — H40033 Anatomical narrow angle, bilateral: Secondary | ICD-10-CM | POA: Diagnosis not present

## 2017-12-25 DIAGNOSIS — H00024 Hordeolum internum left upper eyelid: Secondary | ICD-10-CM | POA: Diagnosis not present

## 2018-01-07 DIAGNOSIS — Z803 Family history of malignant neoplasm of breast: Secondary | ICD-10-CM | POA: Diagnosis not present

## 2018-01-07 DIAGNOSIS — Z1231 Encounter for screening mammogram for malignant neoplasm of breast: Secondary | ICD-10-CM | POA: Diagnosis not present

## 2018-01-09 DIAGNOSIS — H00024 Hordeolum internum left upper eyelid: Secondary | ICD-10-CM | POA: Diagnosis not present

## 2018-01-29 DIAGNOSIS — I10 Essential (primary) hypertension: Secondary | ICD-10-CM | POA: Diagnosis not present

## 2018-02-07 DIAGNOSIS — K580 Irritable bowel syndrome with diarrhea: Secondary | ICD-10-CM | POA: Diagnosis not present

## 2018-02-07 DIAGNOSIS — N904 Leukoplakia of vulva: Secondary | ICD-10-CM | POA: Diagnosis not present

## 2018-02-07 DIAGNOSIS — Z0000 Encounter for general adult medical examination without abnormal findings: Secondary | ICD-10-CM | POA: Diagnosis not present

## 2018-02-07 DIAGNOSIS — K219 Gastro-esophageal reflux disease without esophagitis: Secondary | ICD-10-CM | POA: Diagnosis not present

## 2018-02-07 DIAGNOSIS — I10 Essential (primary) hypertension: Secondary | ICD-10-CM | POA: Diagnosis not present

## 2018-02-07 DIAGNOSIS — Z79899 Other long term (current) drug therapy: Secondary | ICD-10-CM | POA: Diagnosis not present

## 2018-02-07 DIAGNOSIS — M85852 Other specified disorders of bone density and structure, left thigh: Secondary | ICD-10-CM | POA: Diagnosis not present

## 2018-02-07 DIAGNOSIS — E559 Vitamin D deficiency, unspecified: Secondary | ICD-10-CM | POA: Diagnosis not present

## 2018-02-07 DIAGNOSIS — F419 Anxiety disorder, unspecified: Secondary | ICD-10-CM | POA: Diagnosis not present

## 2018-02-07 DIAGNOSIS — E782 Mixed hyperlipidemia: Secondary | ICD-10-CM | POA: Diagnosis not present

## 2018-02-21 DIAGNOSIS — N9089 Other specified noninflammatory disorders of vulva and perineum: Secondary | ICD-10-CM | POA: Diagnosis not present

## 2018-02-21 DIAGNOSIS — R87613 High grade squamous intraepithelial lesion on cytologic smear of cervix (HGSIL): Secondary | ICD-10-CM | POA: Diagnosis not present

## 2018-03-07 DIAGNOSIS — D071 Carcinoma in situ of vulva: Secondary | ICD-10-CM | POA: Diagnosis not present

## 2018-03-11 ENCOUNTER — Other Ambulatory Visit: Payer: Self-pay | Admitting: Obstetrics and Gynecology

## 2018-03-12 ENCOUNTER — Other Ambulatory Visit (HOSPITAL_COMMUNITY): Payer: Self-pay | Admitting: *Deleted

## 2018-03-13 ENCOUNTER — Other Ambulatory Visit (HOSPITAL_COMMUNITY): Payer: Self-pay | Admitting: *Deleted

## 2018-03-14 ENCOUNTER — Encounter (HOSPITAL_COMMUNITY)
Admission: RE | Admit: 2018-03-14 | Discharge: 2018-03-14 | Disposition: A | Discharge status: Home / Self Care | Payer: PPO | Source: Ambulatory Visit | Attending: Obstetrics and Gynecology | Admitting: Obstetrics and Gynecology

## 2018-03-14 ENCOUNTER — Encounter (HOSPITAL_COMMUNITY): Payer: Self-pay

## 2018-03-14 ENCOUNTER — Other Ambulatory Visit (HOSPITAL_COMMUNITY): Payer: PPO

## 2018-03-14 ENCOUNTER — Other Ambulatory Visit: Payer: Self-pay

## 2018-03-14 DIAGNOSIS — Z01810 Encounter for preprocedural cardiovascular examination: Secondary | ICD-10-CM | POA: Diagnosis not present

## 2018-03-14 DIAGNOSIS — Z01812 Encounter for preprocedural laboratory examination: Secondary | ICD-10-CM | POA: Insufficient documentation

## 2018-03-14 HISTORY — DX: Bronchitis, not specified as acute or chronic: J40

## 2018-03-14 HISTORY — DX: Nicotine dependence, unspecified, uncomplicated: F17.200

## 2018-03-14 LAB — CBC
HCT: 46.3 % — ABNORMAL HIGH (ref 36.0–46.0)
HEMOGLOBIN: 15.9 g/dL — AB (ref 12.0–15.0)
MCH: 31.8 pg (ref 26.0–34.0)
MCHC: 34.3 g/dL (ref 30.0–36.0)
MCV: 92.6 fL (ref 78.0–100.0)
Platelets: 176 10*3/uL (ref 150–400)
RBC: 5 MIL/uL (ref 3.87–5.11)
RDW: 13.2 % (ref 11.5–15.5)
WBC: 8.9 10*3/uL (ref 4.0–10.5)

## 2018-03-14 LAB — BASIC METABOLIC PANEL
ANION GAP: 10 (ref 5–15)
BUN: 10 mg/dL (ref 8–23)
CHLORIDE: 97 mmol/L — AB (ref 98–111)
CO2: 30 mmol/L (ref 22–32)
Calcium: 9.1 mg/dL (ref 8.9–10.3)
Creatinine, Ser: 0.76 mg/dL (ref 0.44–1.00)
GFR calc non Af Amer: >60 mL/min (ref >60)
Glucose, Bld: 103 mg/dL — ABNORMAL HIGH (ref 70–99)
POTASSIUM: 3.6 mmol/L (ref 3.5–5.1)
SODIUM: 137 mmol/L (ref 135–145)

## 2018-03-14 NOTE — Patient Instructions (Addendum)
Your procedure is scheduled on: Thursday, July 25  Enter through the Main Entrance of [facility] at: 9 am  Pick up the phone at the desk and dial [phone].  Call this number if you have problems the morning of surgery: [phone].  Remember: Do NOT eat food or Do NOT drink clear liquids (including water) after midnight Wednesday.  Take these medicines the morning of surgery with a SIP OF WATER:  Metoprolol and protonix.  Bring albuterol inhaler with you on day of surgery.  Do Not smoke on the day of surgery.  Stop herbal medications, vitamin supplements, Ibuprofen/NSAIDS at this time.  Do NOT wear jewelry (body piercing), metal hair clips/bobby pins, make-up, or nail polish. Do NOT wear lotions, powders, or perfumes.  You may wear deoderant. Do NOT shave for 48 hours prior to surgery. Do NOT bring valuables to the hospital. Contacts  may not be worn into surgery.  Have a responsible adult drive you home and stay with you for 24 hours after your procedure.  Home with [facility] cell [phone].

## 2018-03-19 NOTE — Anesthesia Preprocedure Evaluation (Addendum)
Anesthesia Evaluation  Patient identified by MRN, date of birth, ID band Patient awake    Reviewed: Allergy & Precautions, NPO status , Patient's Chart, lab work & pertinent test results, reviewed documented beta blocker date and time   Airway Mallampati: II  TM Distance: >3 FB Neck ROM: Full    Dental no notable dental hx. (+) Teeth Intact, Dental Advisory Given   Pulmonary Current Smoker,    Pulmonary exam normal breath sounds clear to auscultation       Cardiovascular hypertension, Pt. on home beta blockers and Pt. on medications Normal cardiovascular exam Rhythm:Regular Rate:Normal     Neuro/Psych negative neurological ROS  negative psych ROS   GI/Hepatic GERD  ,  Endo/Other    Renal/GU      Musculoskeletal  (+) Arthritis ,   Abdominal   Peds  Hematology   Anesthesia Other Findings   Reproductive/Obstetrics                             Lab Results  Component Value Date   WBC 8.9 03/14/2018   HGB 15.9 (H) 03/14/2018   HCT 46.3 (H) 03/14/2018   MCV 92.6 03/14/2018   PLT 176 03/14/2018    Anesthesia Physical Anesthesia Plan  ASA: III  Anesthesia Plan: General   Post-op Pain Management:    Induction: Intravenous  PONV Risk Score and Plan: 2 and Treatment may vary due to age or medical condition, Dexamethasone and Ondansetron  Airway Management Planned: LMA  Additional Equipment:   Intra-op Plan:   Post-operative Plan:   Informed Consent: I have reviewed the patients History and Physical, chart, labs and discussed the procedure including the risks, benefits and alternatives for the proposed anesthesia with the patient or authorized representative who has indicated his/her understanding and acceptance.     Plan Discussed with: CRNA  Anesthesia Plan Comments:         Anesthesia Quick Evaluation

## 2018-03-20 ENCOUNTER — Encounter (HOSPITAL_COMMUNITY): Payer: Self-pay | Admitting: *Deleted

## 2018-03-20 ENCOUNTER — Ambulatory Visit (HOSPITAL_COMMUNITY): Payer: PPO | Admitting: Anesthesiology

## 2018-03-20 ENCOUNTER — Encounter (HOSPITAL_COMMUNITY)
Admission: RE | Disposition: A | Discharge status: Home / Self Care | Payer: Self-pay | Source: Ambulatory Visit | Attending: Obstetrics and Gynecology

## 2018-03-20 ENCOUNTER — Ambulatory Visit (HOSPITAL_COMMUNITY)
Admission: RE | Admit: 2018-03-20 | Discharge: 2018-03-20 | Disposition: A | Discharge status: Home / Self Care | Payer: PPO | Source: Ambulatory Visit | Attending: Obstetrics and Gynecology | Admitting: Obstetrics and Gynecology

## 2018-03-20 ENCOUNTER — Other Ambulatory Visit: Payer: Self-pay

## 2018-03-20 DIAGNOSIS — D071 Carcinoma in situ of vulva: Secondary | ICD-10-CM | POA: Diagnosis not present

## 2018-03-20 DIAGNOSIS — F17200 Nicotine dependence, unspecified, uncomplicated: Secondary | ICD-10-CM | POA: Diagnosis not present

## 2018-03-20 DIAGNOSIS — I10 Essential (primary) hypertension: Secondary | ICD-10-CM | POA: Diagnosis not present

## 2018-03-20 DIAGNOSIS — K219 Gastro-esophageal reflux disease without esophagitis: Secondary | ICD-10-CM | POA: Diagnosis not present

## 2018-03-20 DIAGNOSIS — N9089 Other specified noninflammatory disorders of vulva and perineum: Secondary | ICD-10-CM | POA: Diagnosis not present

## 2018-03-20 DIAGNOSIS — Z79899 Other long term (current) drug therapy: Secondary | ICD-10-CM | POA: Insufficient documentation

## 2018-03-20 DIAGNOSIS — N901 Moderate vulvar dysplasia: Secondary | ICD-10-CM | POA: Diagnosis present

## 2018-03-20 DIAGNOSIS — L723 Sebaceous cyst: Secondary | ICD-10-CM | POA: Diagnosis not present

## 2018-03-20 HISTORY — PX: CYST EXCISION PERINEAL: SHX6278

## 2018-03-20 HISTORY — PX: VULVECTOMY: SHX1086

## 2018-03-20 SURGERY — WIDE EXCISION VULVECTOMY
Anesthesia: General

## 2018-03-20 MED ORDER — SILVER SULFADIAZINE 1 % EX CREA
TOPICAL_CREAM | CUTANEOUS | Status: AC
  Filled 2018-03-20: qty 85

## 2018-03-20 MED ORDER — ACETAMINOPHEN 500 MG PO TABS
ORAL_TABLET | ORAL | Status: AC
  Filled 2018-03-20: qty 1

## 2018-03-20 MED ORDER — ACETAMINOPHEN 500 MG PO TABS
1000.0000 mg | ORAL_TABLET | Freq: Once | ORAL | Status: AC
  Administered 2018-03-20: 1000 mg via ORAL

## 2018-03-20 MED ORDER — FAMOTIDINE 20 MG PO TABS
ORAL_TABLET | ORAL | Status: AC
  Filled 2018-03-20: qty 1

## 2018-03-20 MED ORDER — SCOPOLAMINE 1 MG/3DAYS TD PT72
MEDICATED_PATCH | TRANSDERMAL | Status: AC
  Filled 2018-03-20: qty 1

## 2018-03-20 MED ORDER — PROPOFOL 10 MG/ML IV BOLUS
INTRAVENOUS | Status: DC | PRN
  Administered 2018-03-20: 150 mg via INTRAVENOUS
  Administered 2018-03-20: 50 mg via INTRAVENOUS

## 2018-03-20 MED ORDER — MIDAZOLAM HCL 2 MG/2ML IJ SOLN
INTRAMUSCULAR | Status: DC | PRN
  Administered 2018-03-20: 1 mg via INTRAVENOUS

## 2018-03-20 MED ORDER — BUPIVACAINE HCL (PF) 0.25 % IJ SOLN
INTRAMUSCULAR | Status: AC
  Filled 2018-03-20: qty 30

## 2018-03-20 MED ORDER — ONDANSETRON HCL 4 MG/2ML IJ SOLN
INTRAMUSCULAR | Status: AC
  Filled 2018-03-20: qty 2

## 2018-03-20 MED ORDER — LIDOCAINE HCL (CARDIAC) PF 100 MG/5ML IV SOSY
PREFILLED_SYRINGE | INTRAVENOUS | Status: AC
  Filled 2018-03-20: qty 5

## 2018-03-20 MED ORDER — ONDANSETRON HCL 4 MG/2ML IJ SOLN: 4.0000 mg | Freq: Once | INTRAMUSCULAR | Status: DC | PRN

## 2018-03-20 MED ORDER — HYDROMORPHONE HCL 1 MG/ML IJ SOLN
INTRAMUSCULAR | Status: AC
  Filled 2018-03-20: qty 1

## 2018-03-20 MED ORDER — PROPOFOL 10 MG/ML IV BOLUS
INTRAVENOUS | Status: AC
  Filled 2018-03-20: qty 20

## 2018-03-20 MED ORDER — FAMOTIDINE 20 MG PO TABS
20.0000 mg | ORAL_TABLET | Freq: Once | ORAL | Status: AC
  Administered 2018-03-20: 20 mg via ORAL

## 2018-03-20 MED ORDER — ACETAMINOPHEN 10 MG/ML IV SOLN: 1000.0000 mg | Freq: Once | INTRAVENOUS | Status: DC | PRN

## 2018-03-20 MED ORDER — EPHEDRINE 5 MG/ML INJ
INTRAVENOUS | Status: AC
  Filled 2018-03-20: qty 10

## 2018-03-20 MED ORDER — HYDROMORPHONE HCL 2 MG PO TABS: 2.0000 mg | ORAL_TABLET | Freq: Four times a day (QID) | ORAL | 0 refills | Status: AC | PRN

## 2018-03-20 MED ORDER — LIDOCAINE HCL (CARDIAC) PF 100 MG/5ML IV SOSY
PREFILLED_SYRINGE | INTRAVENOUS | Status: DC | PRN
  Administered 2018-03-20: 100 mg via INTRAVENOUS

## 2018-03-20 MED ORDER — FENTANYL CITRATE (PF) 100 MCG/2ML IJ SOLN
INTRAMUSCULAR | Status: DC | PRN
  Administered 2018-03-20: 50 ug via INTRAVENOUS

## 2018-03-20 MED ORDER — FENTANYL CITRATE (PF) 100 MCG/2ML IJ SOLN
INTRAMUSCULAR | Status: AC
  Filled 2018-03-20: qty 2

## 2018-03-20 MED ORDER — ONDANSETRON HCL 4 MG/2ML IJ SOLN
INTRAMUSCULAR | Status: DC | PRN
  Administered 2018-03-20: 4 mg via INTRAVENOUS

## 2018-03-20 MED ORDER — HYDROMORPHONE HCL 1 MG/ML IJ SOLN
0.2500 mg | INTRAMUSCULAR | Status: DC | PRN
  Administered 2018-03-20 (×2): 0.5 mg via INTRAVENOUS

## 2018-03-20 MED ORDER — DEXAMETHASONE SODIUM PHOSPHATE 4 MG/ML IJ SOLN
INTRAMUSCULAR | Status: AC
  Filled 2018-03-20: qty 1

## 2018-03-20 MED ORDER — LACTATED RINGERS IV SOLN
INTRAVENOUS | Status: DC
  Administered 2018-03-20: 10:00:00 via INTRAVENOUS
  Administered 2018-03-20: 1000 mL via INTRAVENOUS

## 2018-03-20 MED ORDER — GABAPENTIN 100 MG PO CAPS: 200.0000 mg | ORAL_CAPSULE | Freq: Once | ORAL | Status: DC

## 2018-03-20 MED ORDER — DEXAMETHASONE SODIUM PHOSPHATE 10 MG/ML IJ SOLN
INTRAMUSCULAR | Status: DC | PRN
  Administered 2018-03-20: 4 mg via INTRAVENOUS

## 2018-03-20 MED ORDER — BUPIVACAINE HCL (PF) 0.25 % IJ SOLN
INTRAMUSCULAR | Status: DC | PRN
  Administered 2018-03-20: 20 mL

## 2018-03-20 MED ORDER — CEFAZOLIN SODIUM-DEXTROSE 2-4 GM/100ML-% IV SOLN
2.0000 g | INTRAVENOUS | Status: AC
  Administered 2018-03-20: 2 g via INTRAVENOUS

## 2018-03-20 MED ORDER — GABAPENTIN 300 MG PO CAPS
300.0000 mg | ORAL_CAPSULE | Freq: Once | ORAL | Status: AC
  Administered 2018-03-20: 300 mg via ORAL

## 2018-03-20 MED ORDER — MIDAZOLAM HCL 2 MG/2ML IJ SOLN
INTRAMUSCULAR | Status: AC
  Filled 2018-03-20: qty 2

## 2018-03-20 MED ORDER — HYDROCODONE-ACETAMINOPHEN 7.5-325 MG PO TABS: 1.0000 | ORAL_TABLET | Freq: Once | ORAL | Status: DC | PRN

## 2018-03-20 MED ORDER — MEPERIDINE HCL 25 MG/ML IJ SOLN: 6.2500 mg | INTRAMUSCULAR | Status: DC | PRN

## 2018-03-20 MED ORDER — EPHEDRINE SULFATE 50 MG/ML IJ SOLN
INTRAMUSCULAR | Status: DC | PRN
Start: 2018-03-20 — End: 2018-03-20
  Administered 2018-03-20 (×2): 5 mg via INTRAVENOUS
  Administered 2018-03-20: 15 mg via INTRAVENOUS

## 2018-03-20 MED ORDER — SCOPOLAMINE 1 MG/3DAYS TD PT72
1.0000 | MEDICATED_PATCH | TRANSDERMAL | Status: DC
  Administered 2018-03-20: 1.5 mg via TRANSDERMAL

## 2018-03-20 SURGICAL SUPPLY — 22 items
BLADE SURG 15 STRL LF C SS BP (BLADE) ×2 IMPLANT
BLADE SURG 15 STRL SS (BLADE) ×3
CATH ROBINSON RED A/P 16FR (CATHETERS) ×2 IMPLANT
CATH SILICONE 16FRX5CC (CATHETERS) ×1 IMPLANT
COUNTER NEEDLE 1200 MAGNETIC (NEEDLE) IMPLANT
ELECT REM PT RETURN 9FT ADLT (ELECTROSURGICAL) ×3
ELECTRODE REM PT RTRN 9FT ADLT (ELECTROSURGICAL) IMPLANT
GLOVE BIO SURGEON STRL SZ7.5 (GLOVE) ×2 IMPLANT
GLOVE BIOGEL PI IND STRL 7.0 (GLOVE) ×2 IMPLANT
GLOVE BIOGEL PI INDICATOR 7.0 (GLOVE) ×1
GOWN STRL REUS W/TWL LRG LVL3 (GOWN DISPOSABLE) ×9 IMPLANT
NEEDLE HYPO 22GX1.5 SAFETY (NEEDLE) ×3 IMPLANT
PACK VAGINAL MINOR WOMEN LF (CUSTOM PROCEDURE TRAY) ×3 IMPLANT
PAD OB MATERNITY 4.3X12.25 (PERSONAL CARE ITEMS) ×3 IMPLANT
PENCIL BUTTON HOLSTER BLD 10FT (ELECTRODE) ×1 IMPLANT
SUT VIC AB 0 CT2 27 (SUTURE) ×1 IMPLANT
SUT VIC AB 2-0 CT1 27 (SUTURE) ×3
SUT VIC AB 2-0 CT1 TAPERPNT 27 (SUTURE) IMPLANT
SUT VIC AB 2-0 SH 27 (SUTURE) ×3
SUT VIC AB 2-0 SH 27XBRD (SUTURE) IMPLANT
SUT VICRYL RAPIDE 4/0 PS 2 (SUTURE) ×1 IMPLANT
TOWEL OR 17X24 6PK STRL BLUE (TOWEL DISPOSABLE) ×6 IMPLANT

## 2018-03-20 NOTE — Op Note (Signed)
NAME: SAMYRIA, RUDIE MEDICAL RECORD [id number] ACCOUNT [id number] DATE OF BIRTH:Nov 22, 1950 FACILITY: [facility] LOCATION: [facility] PHYSICIAN:Tin Engram J. 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 Incision and drainage of vulvar sebaceous cyst  SURGEON:  Brien Few, MD  ASSISTANT:  None.  ANESTHESIA:  General and local.  ESTIMATED BLOOD LOSS:  About 30 mL.  COMPLICATIONS:  None.  DRAINS:  None.  COUNTS:  Correct.  INDICATIONS:  The patient was taken to the recovery in good condition.  DESCRIPTION OF PROCEDURE:  After being apprised of the risks of anesthesia, infection, bleeding, injury to surrounding organs, possible need for repair, inability to prevent recurrent cancer, the patient was brought to the operating room where she was  administered general anesthetic without complications.  Prepped and draped in usual sterile fashion.  Catheterized until the bladder was empty.  Exam under anesthesia reveals a warty appearing previously biopsied lesion that extends from the midline in  the posterior fourchette down to the perianal area and upwards to the vaginal introitus.  At this time, a marker is used to create a margin of excision of approximately 0.5 cm due to the preinvasive disease status of this lesion and an elliptical  incision is drawn around encompassing the entire lesion down to the perianal area at this time.  The lesion with 0.5 cm margin is excised is incised in elliptical fashion and excised sharply using scissors with the specimen removed in one complete block.   Hemostasis in the operative bed is established using electrocautery.  At this time, the lesion was completely excised.  The defect was closed using interrupted 0 Vicryl sutures to close the deep  tissues and then closed using a 2-0 Vicryl in a  continuous running fashion.  A dilute Marcaine solution was placed, 20 mL total.  Per patient request, a sebaceous cyst to the right is also lanced with a thick sebaceous material expressed at this time.  Good hemostasis was noted.    The patient tolerated the procedure well, is awakened and transferred to recovery in good condition.  Pathology pending.  AN/NUANCE  D:03/20/2018 T:03/20/2018 JOB:[id number]

## 2018-03-20 NOTE — Anesthesia Procedure Notes (Signed)
Procedure Name: LMA Insertion Date/Time: 03/20/2018 10:10 AM Performed by: Hewitt Blade, CRNA Pre-anesthesia Checklist: Patient identified, Emergency Drugs available, Suction available and Patient being monitored Patient Re-evaluated:Patient Re-evaluated prior to induction Oxygen Delivery Method: Circle system utilized Preoxygenation: Pre-oxygenation with 100% oxygen Induction Type: IV induction Ventilation: Mask ventilation without difficulty LMA: LMA inserted LMA Size: 4.0 Number of attempts: 1 Placement Confirmation: positive ETCO2 and breath sounds checked- equal and bilateral Tube secured with: Tape Dental Injury: Teeth and Oropharynx as per pre-operative assessment

## 2018-03-20 NOTE — H&P (Signed)
NAME: Kristen Wilson, Kristen Wilson MEDICAL RECORD [id number] ACCOUNT [id number] DATE OF BIRTH:06/13/1951 FACILITY: [facility] LOCATION: [facility] PHYSICIAN:Nicholas Trompeter J. Ronita Hipps, MD  HISTORY AND PHYSICAL  DATE OF ADMISSION:  03/20/2018  CHIEF COMPLAINT:  VIN-2.  HISTORY OF PRESENT ILLNESS:  The patient is a 67 year old white female G3 P2 who presents with a vulvar lesion consistent on biopsy with VIN2 for wide local excision.  ALLERGIES:  LATEX, CIPRO, FLAGYL, PERCOCET.  SOCIAL HISTORY:  She is a smoker, nondrinker.  She denies domestic physical violence,   PAST MEDICAL HISTORY:  History of vaginal delivery x2, history of tubal ligation, cholecystectomy, laparoscopy, knee surgery.  MEDICATIONS:  Multiple.  See the patient's list.  FAMILY HISTORY:  Kidney failure, breast cancer, bladder cancer.  Medical problems to include depression, esophageal reflux, carbohydrate intolerance, migraine and IBS.  PHYSICAL EXAMINATION: GENERAL:  Well-developed, well-nourished white female in no acute distress. HEENT:  Normal. NECK:  Supple, full range of motion. LUNGS:  Clear. HEART:  Regular rhythm. ABDOMEN:  Soft, nontender. PELVIC:  Reveals a well-healed biopsy site in the posterior fourchette area.  Otherwise, normal uterus, normal ovaries. EXTREMITIES:  No cords. NEUROLOGIC:  Nonfocal. SKIN:  Intact.  IMPRESSION:  Vulvar intraepithelial neoplasia-2. Preinvasive.  PLAN:  Proceed with wide local excision.  The risks of surgery discussed including infection, bleeding, injury to surrounding organs with possible need for repair, delayed versus immediate complications to include bowel and bladder injury noted.  The  patient acknowledges and wishes to proceed. Inability to prevent recurrent VIN noted  LN/NUANCE  D:03/19/2018 T:03/19/2018 JOB:[id number]

## 2018-03-20 NOTE — Op Note (Addendum)
03/20/2018  10:39 AM  PATIENT:  Domingo Dimes Racca  67 y.o. female  PRE-OPERATIVE DIAGNOSIS:  High Grade Vulvar Lesion Synmptomatic right vulvar sebaceous cyst  POST-OPERATIVE DIAGNOSIS:  High Grade Vulvar Lesion Vulvar sebaceous cyst  PROCEDURE:  Procedure(s): WIDE LOCAL EXCISION OF VULVAR LESION INCISION AND DRAINAGE OF VULVAR SEBACEOUS CYST  SURGEON:  Surgeon(s): Brien Few, MD  ASSISTANTS: NA   ANESTHESIA:   local and general  ESTIMATED BLOOD LOSS: 25 mL   DRAINS: none   LOCAL MEDICATIONS USED:  MARCAINE    and Amount: 20 ml  SPECIMEN:  Source of Specimen:  POSTERIOR FOURCHETTE LESION  DISPOSITION OF SPECIMEN:  PATHOLOGY  COUNTS:  YES  DICTATION #: [id number]  PLAN OF CARE: DC HOME  PATIENT DISPOSITION:  PACU - hemodynamically stable.

## 2018-03-20 NOTE — Progress Notes (Signed)
Patient ID: Kristen Wilson, female   DOB: 1951-07-04, 67 y.o.   MRN: [id number] Patient seen and examined. Consent witnessed and signed. No changes noted. Update completed.

## 2018-03-20 NOTE — Anesthesia Postprocedure Evaluation (Signed)
Anesthesia Post Note  Patient: Kristen Wilson  Procedure(s) Performed: WIDE EXCISION VULVECTOMY (N/A ) INCISION AND DRAINAGE OF PERINEAL CYST     Patient location during evaluation: PACU Anesthesia Type: General Level of consciousness: awake and alert Pain management: pain level controlled Vital Signs Assessment: post-procedure vital signs reviewed and stable Respiratory status: spontaneous breathing, nonlabored ventilation, respiratory function stable and patient connected to nasal cannula oxygen Cardiovascular status: blood pressure returned to baseline and stable Postop Assessment: no apparent nausea or vomiting Anesthetic complications: no    Last Vitals:  Vitals:   03/20/18 1130 03/20/18 1145  BP: (!) 122/57 138/64  Pulse: (!) 54 (!) 57  Resp: 12 14  Temp:    SpO2: 99% 100%    Last Pain:  Vitals:   03/20/18 1200  TempSrc:   PainSc: 1    Pain Goal: Patients Stated Pain Goal: 2 (03/20/18 1200)               Barnet Glasgow

## 2018-03-20 NOTE — Discharge Instructions (Signed)
Vulvectomy, Care After Refer to this sheet in the next few weeks. These instructions provide you with information about caring for yourself after your procedure. Your health care provider may also give you more specific instructions. Your treatment has been planned according to current medical practices, but problems sometimes occur. Call your health care provider if you have any problems or questions after your procedure. What can I expect after the procedure? After the procedure, it is common to have:  Vaginal pain.  Vaginal numbness.  Vaginal swelling.  Bloody vaginal discharge.  Follow these instructions at home: Activity   Rest as told by your health care provider.  Do not lift, push, or pull more than 5 lb (2.3 kg).  Avoid activities that require a lot of energy for as long as told by your health care provider. This includes any exercise.  Raise (elevate) your legs while sitting or lying down.  Avoid standing or sitting in one place for long periods of time.  Do not cross your legs, especially when sitting. Bathing  Do not take baths, swim, or use a hot tub until your health care provider approves. Ask your health care provider if you can take showers. You may only be allowed to take sponge baths for bathing.  After passing urine or a bowel movement, wipe yourself from front to back and clean your vaginal area using a spray bottle.  If told by your health care provider, take a sitz bath to help with discomfort. This is a warm water bath you take while sitting down. ? Do this 3-4 times per day, or as often as told by your health care provider. ? The water should only come up to your hips and cover your buttocks. ? You may pat the area dry with a soft, clean towel. If needed, you may then gently dry the area with a hair dryer on a cool setting for 5-10 minutes. An enclosed box fan may also be used to gently dry the area. Incision care   Follow instructions from your  health care provider about how to take care of your incision. Make sure you: ? Wash your hands with soap and water before you change your bandage (dressing). If soap and water are not available, use hand sanitizer. ? Change your dressing as told by your health care provider. ? Leave stitches (sutures), skin glue, adhesive strips, or surgical clips in place. These skin closures may need to stay in place for 2 weeks or longer. If adhesive strip edges start to loosen and curl up, you may trim the loose edges. Do not remove adhesive strips completely unless your health care provider tells you to do that.  Check your incision area every day for signs of infection. Check for: ? More redness, swelling, or pain. ? More fluid or blood. ? Warmth. ? Pus or a bad smell. Lifestyle  Do not douche or use tampons until your health care provider approves.  Do not have sex until your health care provider approves. Tell your health care provider if you have pain or numbness when you return to sexual activity.  Wear comfortable, loose-fitting clothing. Driving  Do not drive or operate heavy machinery while taking prescription pain medicine.  Do not drive for 24 hours if you received a medicine to help you relax (sedative). General instructions   Take over-the-counter and prescription medicines only as told by your health care provider.  Take a stool softener to help prevent constipation as told by your  health care provider. You may need to do this if you are taking prescription pain medicine.  Drink enough fluid to keep your urine clear or pale yellow.  If you were sent home with a drain, take care of it as told by your health care provider.  Wear compression stockings as told by your health care provider. These stockings help to prevent blood clots and reduce swelling in your legs.  Keep all follow-up visits as told by your health care provider. This is important. Contact a health care provider  if:  You have more redness, swelling, or pain around your incision.  You have more fluid or blood coming from your incision.  Your incision feels warm to the touch.  You have pus or a bad smell coming from your incision.  You have a fever.  You have painful or bloody urination.  You feel nauseous or you vomit.  You develop diarrhea.  You develop constipation.  You develop a rash.  You feel dizzy or light-headed.  You have pain that does not get better with medicine.  Your incision breaks open. Get help right away if:  You faint.  You develop leg or chest pain.  You develop abdominal pain.  You develop shortness of breath. This information is not intended to replace advice given to you by your health care provider. Make sure you discuss any questions you have with your health care provider. Document Released: 03/27/2004 Document Revised: 01/19/2016 Document Reviewed: 08/08/2015 Elsevier Interactive Patient Education  2018 [facility] Care Instructions  Activity: Get plenty of rest for the remainder of the day. A responsible individual must stay with you for 24 hours following the procedure.  For the next 24 hours, DO NOT: -Drive a car -Paediatric nurse -Drink alcoholic beverages -Take any medication unless instructed by your physician -Make any legal decisions or sign important papers.  Meals: Start with liquid foods such as gelatin or soup. Progress to regular foods as tolerated. Avoid greasy, spicy, heavy foods. If nausea and/or vomiting occur, drink only clear liquids until the nausea and/or vomiting subsides. Call your physician if vomiting continues.  Special Instructions/Symptoms: Your throat may feel dry or sore from the anesthesia or the breathing tube placed in your throat during surgery. If this causes discomfort, gargle with warm salt water. The discomfort should disappear within 24 hours.  If you had a scopolamine patch  placed behind your ear for the management of post- operative nausea and/or vomiting:  1. The medication in the patch is effective for 72 hours, after which it should be removed.  Wrap patch in a tissue and discard in the trash. Wash hands thoroughly with soap and water. 2. You may remove the patch earlier than 72 hours if you experience unpleasant side effects which may include dry mouth, dizziness or visual disturbances. 3. Avoid touching the patch. Wash your hands with soap and water after contact with the patch.

## 2018-03-20 NOTE — Transfer of Care (Signed)
Immediate Anesthesia Transfer of Care Note  Patient: Kristen Wilson  Procedure(s) Performed: WIDE EXCISION VULVECTOMY (N/A ) INCISION AND DRAINAGE OF PERINEAL CYST  Patient Location: PACU  Anesthesia Type:General  Level of Consciousness: awake, alert  and oriented  Airway & Oxygen Therapy: Patient Spontanous Breathing and Patient connected to nasal cannula oxygen  Post-op Assessment: Report given to RN, Post -op Vital signs reviewed and stable and Patient moving all extremities  Post vital signs: Reviewed and stable  Last Vitals:  Vitals Value Taken Time  BP 149/64 03/20/2018 10:54 AM  Temp    Pulse    Resp 14 03/20/2018 10:55 AM  SpO2    Vitals shown include unvalidated device data.  Last Pain:  Vitals:   03/20/18 0914  TempSrc: Oral  PainSc: 0-No pain      Patients Stated Pain Goal: 2 (56/15/37 9432)  Complications: No apparent anesthesia complications

## 2018-03-21 ENCOUNTER — Encounter (HOSPITAL_COMMUNITY): Payer: Self-pay | Admitting: Obstetrics and Gynecology

## 2018-04-17 DIAGNOSIS — B373 Candidiasis of vulva and vagina: Secondary | ICD-10-CM | POA: Diagnosis not present

## 2018-04-17 DIAGNOSIS — D071 Carcinoma in situ of vulva: Secondary | ICD-10-CM | POA: Diagnosis not present

## 2018-05-15 DIAGNOSIS — D071 Carcinoma in situ of vulva: Secondary | ICD-10-CM | POA: Diagnosis not present

## 2019-09-01 ENCOUNTER — Emergency Department (HOSPITAL_COMMUNITY): Payer: Medicare Other

## 2019-09-01 ENCOUNTER — Other Ambulatory Visit: Payer: Self-pay

## 2019-09-01 ENCOUNTER — Emergency Department (HOSPITAL_COMMUNITY)
Admission: EM | Admit: 2019-09-01 | Discharge: 2019-09-01 | Discharge status: Against Medical Advice | Payer: Medicare Other | Attending: Emergency Medicine | Admitting: Emergency Medicine

## 2019-09-01 DIAGNOSIS — Z5321 Procedure and treatment not carried out due to patient leaving prior to being seen by health care provider: Secondary | ICD-10-CM | POA: Insufficient documentation

## 2019-09-01 DIAGNOSIS — R0789 Other chest pain: Secondary | ICD-10-CM | POA: Diagnosis present

## 2019-09-01 LAB — BASIC METABOLIC PANEL
Anion gap: 9 (ref 5–15)
BUN: 10 mg/dL (ref 8–23)
CO2: 30 mmol/L (ref 22–32)
Calcium: 8.9 mg/dL (ref 8.9–10.3)
Chloride: 98 mmol/L (ref 98–111)
Creatinine, Ser: 0.82 mg/dL (ref 0.44–1.00)
GFR calc Af Amer: >60 mL/min (ref >60)
GFR calc non Af Amer: >60 mL/min (ref >60)
Glucose, Bld: 103 mg/dL — ABNORMAL HIGH (ref 70–99)
Potassium: 3.3 mmol/L — ABNORMAL LOW (ref 3.5–5.1)
Sodium: 137 mmol/L (ref 135–145)

## 2019-09-01 LAB — CBC
HCT: 48 % — ABNORMAL HIGH (ref 36.0–46.0)
Hemoglobin: 16.1 g/dL — ABNORMAL HIGH (ref 12.0–15.0)
MCH: 31.1 pg (ref 26.0–34.0)
MCHC: 33.5 g/dL (ref 30.0–36.0)
MCV: 92.8 fL (ref 80.0–100.0)
Platelets: 168 10*3/uL (ref 150–400)
RBC: 5.17 MIL/uL — ABNORMAL HIGH (ref 3.87–5.11)
RDW: 12.2 % (ref 11.5–15.5)
WBC: 11.1 10*3/uL — ABNORMAL HIGH (ref 4.0–10.5)
nRBC: 0 % (ref 0.0–0.2)

## 2019-09-01 LAB — TROPONIN I (HIGH SENSITIVITY): Troponin I (High Sensitivity): 4 ng/L (ref <18)

## 2019-09-01 NOTE — ED Notes (Signed)
Pt states that she is going to leave because she does not have her meds, pt seen exiting the lobby.

## 2019-09-01 NOTE — ED Notes (Signed)
Pt left, IV was taken out before she left the lobby

## 2019-09-01 NOTE — ED Triage Notes (Signed)
Pt here from home for L sided chest pain with radiating to flank and shoulder. Denies shob, n/v. Pain worse with movement and deep breath. Hx pleurisy and states this feels the same.

## 2019-10-17 ENCOUNTER — Ambulatory Visit: Payer: Medicare Other | Attending: Internal Medicine

## 2019-10-17 DIAGNOSIS — Z23 Encounter for immunization: Secondary | ICD-10-CM

## 2019-10-17 NOTE — Progress Notes (Signed)
   Covid-19 Vaccination Clinic  Name:  Kristen Wilson    MRN: [id number] DOB: 06/25/1951  10/17/2019  [name] was observed post Covid-19 immunization for 15 minutes without incidence. She was provided with Vaccine Information Sheet and instruction to access the V-Safe system.   [name] was instructed to call 911 with any severe reactions post vaccine: Marland Kitchen Difficulty breathing  . Swelling of your face and throat  . A fast heartbeat  . A bad rash all over your body  . Dizziness and weakness    Immunizations Administered    Name Date Dose VIS Date Route   Pfizer COVID-19 Vaccine 10/17/2019  3:16 PM 0.3 mL 08/07/2019 Intramuscular   Manufacturer: [place]   Lot: [id number]   [place]: [id number]

## 2019-11-10 ENCOUNTER — Ambulatory Visit: Payer: Medicare Other | Attending: Internal Medicine

## 2019-11-10 DIAGNOSIS — Z23 Encounter for immunization: Secondary | ICD-10-CM

## 2019-11-10 NOTE — Progress Notes (Signed)
   Covid-19 Vaccination Clinic  Name:  Kristen Wilson    MRN: [id number] DOB: 1951/08/25  11/10/2019  Kristen Wilson was observed post Covid-19 immunization for 15 minutes without incident. She was provided with Vaccine Information Sheet and instruction to access the V-Safe system.   Kristen Wilson was instructed to call 911 with any severe reactions post vaccine: Marland Kitchen Difficulty breathing  . Swelling of face and throat  . A fast heartbeat  . A bad rash all over body  . Dizziness and weakness   Immunizations Administered    Name Date Dose VIS Date Route   Pfizer COVID-19 Vaccine 11/10/2019  3:19 PM 0.3 mL 08/07/2019 Intramuscular   Manufacturer: [place]   Lot: [id number]   [place]: [id number]

## 2020-08-09 ENCOUNTER — Other Ambulatory Visit (HOSPITAL_COMMUNITY): Payer: Self-pay | Admitting: Physician Assistant

## 2020-08-09 NOTE — Progress Notes (Signed)
I connected by phone with Kristen Wilson on 08/09/2020 at 5:04 PM to discuss the potential use of a new treatment for mild to moderate COVID-19 viral infection in non-hospitalized patients.  This patient is a 69 y.o. female that meets the FDA criteria for Emergency Use Authorization of COVID monoclonal antibody casirivimab/imdevimab, bamlanivimab/etesevimab, or sotrovimab.  Has a (+) direct SARS-CoV-2 viral test result  Has mild or moderate COVID-19   Is NOT hospitalized due to COVID-19  Is within 10 days of symptom onset  Has at least one of the high risk factor(s) for progression to severe COVID-19 and/or hospitalization as defined in EUA.  Specific high risk criteria : Older age (>/= 70 yo) and Chronic Lung Disease   I have spoken and communicated the following to the patient or parent/caregiver regarding COVID monoclonal antibody treatment:  1. FDA has authorized the emergency use for the treatment of mild to moderate COVID-19 in adults and pediatric patients with positive results of direct SARS-CoV-2 viral testing who are 44 years of age and older weighing at least 40 kg, and who are at high risk for progressing to severe COVID-19 and/or hospitalization.  2. The significant known and potential risks and benefits of COVID monoclonal antibody, and the extent to which such potential risks and benefits are unknown.  3. Information on available alternative treatments and the risks and benefits of those alternatives, including clinical trials.  4. Patients treated with COVID monoclonal antibody should continue to self-isolate and use infection control measures (e.g., wear mask, isolate, social distance, avoid sharing personal items, clean and disinfect "high touch" surfaces, and frequent handwashing) according to CDC guidelines.   5. The patient or parent/caregiver has the option to accept or refuse COVID monoclonal antibody treatment.  After reviewing this information with the patient,  the patient has agreed to receive one of the available covid 19 monoclonal antibodies and will be provided an appropriate fact sheet prior to infusion. Konrad Felix, PA-C 08/09/2020 5:04 PM

## 2020-08-10 ENCOUNTER — Telehealth (HOSPITAL_COMMUNITY): Payer: Self-pay

## 2020-08-10 NOTE — Telephone Encounter (Signed)
Pt called COVID infusion clinic to cancel her monoclonal antibody infusion today at 1:30. She stated her insurance would not cover the charge.

## 2020-09-25 DIAGNOSIS — J441 Chronic obstructive pulmonary disease with (acute) exacerbation: Secondary | ICD-10-CM | POA: Diagnosis not present

## 2020-09-25 DIAGNOSIS — I10 Essential (primary) hypertension: Secondary | ICD-10-CM | POA: Diagnosis not present

## 2020-09-25 DIAGNOSIS — E782 Mixed hyperlipidemia: Secondary | ICD-10-CM | POA: Diagnosis not present

## 2020-09-25 DIAGNOSIS — J410 Simple chronic bronchitis: Secondary | ICD-10-CM | POA: Diagnosis not present

## 2020-09-25 DIAGNOSIS — J449 Chronic obstructive pulmonary disease, unspecified: Secondary | ICD-10-CM | POA: Diagnosis not present

## 2020-09-25 DIAGNOSIS — K219 Gastro-esophageal reflux disease without esophagitis: Secondary | ICD-10-CM | POA: Diagnosis not present

## 2020-09-30 DIAGNOSIS — J441 Chronic obstructive pulmonary disease with (acute) exacerbation: Secondary | ICD-10-CM | POA: Diagnosis not present

## 2020-09-30 DIAGNOSIS — J449 Chronic obstructive pulmonary disease, unspecified: Secondary | ICD-10-CM | POA: Diagnosis not present

## 2020-09-30 DIAGNOSIS — E782 Mixed hyperlipidemia: Secondary | ICD-10-CM | POA: Diagnosis not present

## 2020-09-30 DIAGNOSIS — J410 Simple chronic bronchitis: Secondary | ICD-10-CM | POA: Diagnosis not present

## 2020-09-30 DIAGNOSIS — I10 Essential (primary) hypertension: Secondary | ICD-10-CM | POA: Diagnosis not present

## 2020-09-30 DIAGNOSIS — K219 Gastro-esophageal reflux disease without esophagitis: Secondary | ICD-10-CM | POA: Diagnosis not present

## 2020-10-19 DIAGNOSIS — E782 Mixed hyperlipidemia: Secondary | ICD-10-CM | POA: Diagnosis not present

## 2020-10-24 DIAGNOSIS — M85859 Other specified disorders of bone density and structure, unspecified thigh: Secondary | ICD-10-CM | POA: Diagnosis not present

## 2020-10-24 DIAGNOSIS — K219 Gastro-esophageal reflux disease without esophagitis: Secondary | ICD-10-CM | POA: Diagnosis not present

## 2020-10-24 DIAGNOSIS — E538 Deficiency of other specified B group vitamins: Secondary | ICD-10-CM | POA: Diagnosis not present

## 2020-10-24 DIAGNOSIS — K580 Irritable bowel syndrome with diarrhea: Secondary | ICD-10-CM | POA: Diagnosis not present

## 2020-10-24 DIAGNOSIS — E782 Mixed hyperlipidemia: Secondary | ICD-10-CM | POA: Diagnosis not present

## 2020-10-24 DIAGNOSIS — R42 Dizziness and giddiness: Secondary | ICD-10-CM | POA: Diagnosis not present

## 2020-10-24 DIAGNOSIS — J449 Chronic obstructive pulmonary disease, unspecified: Secondary | ICD-10-CM | POA: Diagnosis not present

## 2020-10-24 DIAGNOSIS — I10 Essential (primary) hypertension: Secondary | ICD-10-CM | POA: Diagnosis not present

## 2020-10-24 DIAGNOSIS — R001 Bradycardia, unspecified: Secondary | ICD-10-CM | POA: Diagnosis not present

## 2020-10-31 ENCOUNTER — Emergency Department (HOSPITAL_COMMUNITY)
Admission: EM | Admit: 2020-10-31 | Discharge: 2020-11-01 | Disposition: A | Discharge status: Home / Self Care | Payer: Medicare Other | Attending: Emergency Medicine | Admitting: Emergency Medicine

## 2020-10-31 ENCOUNTER — Other Ambulatory Visit: Payer: Self-pay

## 2020-10-31 ENCOUNTER — Emergency Department (HOSPITAL_COMMUNITY): Payer: Medicare Other

## 2020-10-31 ENCOUNTER — Encounter (HOSPITAL_COMMUNITY): Payer: Self-pay

## 2020-10-31 DIAGNOSIS — R509 Fever, unspecified: Secondary | ICD-10-CM | POA: Diagnosis not present

## 2020-10-31 DIAGNOSIS — I10 Essential (primary) hypertension: Secondary | ICD-10-CM | POA: Insufficient documentation

## 2020-10-31 DIAGNOSIS — K219 Gastro-esophageal reflux disease without esophagitis: Secondary | ICD-10-CM | POA: Diagnosis not present

## 2020-10-31 DIAGNOSIS — R109 Unspecified abdominal pain: Secondary | ICD-10-CM | POA: Diagnosis not present

## 2020-10-31 DIAGNOSIS — R197 Diarrhea, unspecified: Secondary | ICD-10-CM | POA: Insufficient documentation

## 2020-10-31 DIAGNOSIS — R1031 Right lower quadrant pain: Secondary | ICD-10-CM | POA: Insufficient documentation

## 2020-10-31 DIAGNOSIS — F17210 Nicotine dependence, cigarettes, uncomplicated: Secondary | ICD-10-CM | POA: Diagnosis not present

## 2020-10-31 DIAGNOSIS — Z91040 Latex allergy status: Secondary | ICD-10-CM | POA: Diagnosis not present

## 2020-10-31 DIAGNOSIS — Z79899 Other long term (current) drug therapy: Secondary | ICD-10-CM | POA: Diagnosis not present

## 2020-10-31 DIAGNOSIS — K760 Fatty (change of) liver, not elsewhere classified: Secondary | ICD-10-CM | POA: Diagnosis not present

## 2020-10-31 DIAGNOSIS — Z96652 Presence of left artificial knee joint: Secondary | ICD-10-CM | POA: Insufficient documentation

## 2020-10-31 LAB — COMPREHENSIVE METABOLIC PANEL
ALT: 28 U/L (ref 0–44)
AST: 32 U/L (ref 15–41)
Albumin: 4 g/dL (ref 3.5–5.0)
Alkaline Phosphatase: 75 U/L (ref 38–126)
Anion gap: 10 (ref 5–15)
BUN: 9 mg/dL (ref 8–23)
CO2: 32 mmol/L (ref 22–32)
Calcium: 9.2 mg/dL (ref 8.9–10.3)
Chloride: 97 mmol/L — ABNORMAL LOW (ref 98–111)
Creatinine, Ser: 0.79 mg/dL (ref 0.44–1.00)
GFR, Estimated: >60 mL/min (ref >60)
Glucose, Bld: 97 mg/dL (ref 70–99)
Potassium: 3.5 mmol/L (ref 3.5–5.1)
Sodium: 139 mmol/L (ref 135–145)
Total Bilirubin: 0.9 mg/dL (ref 0.3–1.2)
Total Protein: 6.7 g/dL (ref 6.5–8.1)

## 2020-10-31 LAB — URINALYSIS, ROUTINE W REFLEX MICROSCOPIC
Bilirubin Urine: NEGATIVE
Glucose, UA: NEGATIVE mg/dL
Hgb urine dipstick: NEGATIVE
Ketones, ur: NEGATIVE mg/dL
Leukocytes,Ua: NEGATIVE
Nitrite: NEGATIVE
Protein, ur: NEGATIVE mg/dL
Specific Gravity, Urine: 1.012 (ref 1.005–1.030)
pH: 7 (ref 5.0–8.0)

## 2020-10-31 LAB — CBC
HCT: 46.1 % — ABNORMAL HIGH (ref 36.0–46.0)
Hemoglobin: 15.8 g/dL — ABNORMAL HIGH (ref 12.0–15.0)
MCH: 32 pg (ref 26.0–34.0)
MCHC: 34.3 g/dL (ref 30.0–36.0)
MCV: 93.3 fL (ref 80.0–100.0)
Platelets: 164 10*3/uL (ref 150–400)
RBC: 4.94 MIL/uL (ref 3.87–5.11)
RDW: 13 % (ref 11.5–15.5)
WBC: 6.9 10*3/uL (ref 4.0–10.5)
nRBC: 0 % (ref 0.0–0.2)

## 2020-10-31 LAB — LIPASE, BLOOD: Lipase: 20 U/L (ref 11–51)

## 2020-10-31 MED ORDER — KETOROLAC TROMETHAMINE 30 MG/ML IJ SOLN
30.0000 mg | Freq: Once | INTRAMUSCULAR | Status: AC
  Administered 2020-10-31: 30 mg via INTRAVENOUS
  Filled 2020-10-31: qty 1

## 2020-10-31 MED ORDER — SODIUM CHLORIDE 0.9 % IV BOLUS
1000.0000 mL | Freq: Once | INTRAVENOUS | Status: AC
  Administered 2020-10-31: 1000 mL via INTRAVENOUS

## 2020-10-31 MED ORDER — ONDANSETRON HCL 4 MG/2ML IJ SOLN
4.0000 mg | Freq: Once | INTRAMUSCULAR | Status: AC
  Administered 2020-10-31: 4 mg via INTRAVENOUS
  Filled 2020-10-31: qty 2

## 2020-10-31 MED ORDER — IOHEXOL 300 MG/ML  SOLN
100.0000 mL | Freq: Once | INTRAMUSCULAR | Status: AC | PRN
  Administered 2020-10-31: 100 mL via INTRAVENOUS

## 2020-10-31 NOTE — Discharge Instructions (Addendum)

## 2020-10-31 NOTE — ED Triage Notes (Signed)
Pt presents with c/o RLQ abdominal pain for 2 days. Pt reports her stools are also looser than usual.

## 2020-10-31 NOTE — ED Provider Notes (Signed)
[facility] DEPT Provider Note   CSN: [id number] Arrival date & time: 10/31/20  1451     History Chief Complaint  Patient presents with  . Abdominal Pain    Kristen Wilson is a 70 y.o. female.  HPI   Pt is a 70 y/o female with a h/o allergies, anxiety, arthritis, bronchitis, cough, depression, diverticulitis, GERD, HLD, HTN, IBS, osteoporosis, who presents to the ED today for eval of RLQ pain that started 3 days ago. Pain has been intermittent in nature. Describes pain as a throbbing that is intermittently sharp with certain movements or positions. The pain intermittent radiates to the right flank. Denies associated nausea, vomiting, constipation, dysuria, frequency, hematuria, or fevers. She reports subjective fevers and looser stools.  Past Medical History:  Diagnosis Date  . Allergy   . Anginal pain (HCC)    hx- non recent - r/t anxiety/panic attack  . Anxiety   . Arthritis    "Whole body"  . Bronchitis    uses inhaler prn  . Cough    smoker s cough  . Depression   . Diverticulitis   . GERD (gastroesophageal reflux disease)   . Hiatal hernia   . Hyperlipidemia   . Hypertension   . IBS (irritable bowel syndrome)   . Osteoporosis   . Pneumonia    hx  . Primary localized osteoarthritis of left knee   . Shortness of breath    occ  . Smoker   . SVD (spontaneous vaginal delivery)    x 2  . Upper respiratory tract infection 06/14/2015   Starting IV Rocephin today also starting nebulizers for wheezing    Patient Active Problem List   Diagnosis Date Noted  . Upper respiratory tract infection 06/14/2015  . DJD (degenerative joint disease) of knee 06/13/2015  . Primary localized osteoarthritis of left knee   . Hiatal hernia   . Arthritis   . Allergy   . Depression   . GERD (gastroesophageal reflux disease)   . Osteoporosis   . Shortness of breath   . IBS (irritable bowel syndrome)   . Hypertension 11/02/2011  . Chest pain  11/02/2011  . Hyperlipidemia     Past Surgical History:  Procedure Laterality Date  . CHOLECYSTECTOMY    . COLONOSCOPY N/A 03/20/2013   Procedure: COLONOSCOPY;  Surgeon: Wonda Horner, MD;  Location: [facility];  Service: Endoscopy;  Laterality: N/A;  . CYST EXCISION PERINEAL  03/20/2018   Procedure: INCISION AND DRAINAGE OF PERINEAL CYST;  Surgeon: Brien Few, MD;  Location: [facility];  Service: Gynecology;;  . DILATION AND CURETTAGE OF UTERUS    . KNEE ARTHROSCOPY     left  . TONSILLECTOMY    . 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   Procedure: LEFT TOTAL KNEE ARTHROPLASTY;  Surgeon: Elsie Saas, MD;  Location: [facility];  Service: Orthopedics;  Laterality: Left;  . TUBAL LIGATION    . UPPER GI ENDOSCOPY     reflux, hiatal hernia  . VULVECTOMY N/A 03/20/2018   Procedure: WIDE EXCISION VULVECTOMY;  Surgeon: Brien Few, MD;  Location: [facility];  Service: Gynecology;  Laterality: N/A;  . WISDOM TOOTH EXTRACTION       OB History   No obstetric history on file.     Family History  Problem Relation Age of Onset  . Cancer Mother        breast  . Arthritis Mother   . Diabetes Mother   . Cancer Father  bladder,multiple myeloma    Social History   Tobacco Use  . Smoking status: Current Every Day Smoker    Packs/day: 1.50    Years: 40.00    Pack years: 60.00    Types: Cigarettes  . Smokeless tobacco: Never Used  Vaping Use  . Vaping Use: Every day  Substance Use Topics  . Alcohol use: No  . Drug use: No    Home Medications Prior to Admission medications   Medication Sig Start Date End Date Taking? Authorizing Provider  albuterol (PROVENTIL HFA;VENTOLIN HFA) 108 (90 BASE) MCG/ACT inhaler Inhale 2 puffs into the lungs every 6 (six) hours as needed for wheezing or shortness of breath. 06/15/15   Shepperson, Kirstin, PA-C  Cholecalciferol (VITAMIN D3) 2000 units TABS Take 2,000 Units by mouth daily.    [provider]  clonazePAM (KLONOPIN) 0.5 MG  tablet Take 0.25 mg by mouth at bedtime.     [provider]  docusate sodium (COLACE) 100 MG capsule 1 tab 2 times a day while on narcotics.  STOOL SOFTENER 06/15/15   Shepperson, Kirstin, PA-C  DULoxetine (CYMBALTA) 30 MG capsule Take 90 mg by mouth at bedtime.     [provider]  hydrochlorothiazide (HYDRODIURIL) 25 MG tablet Take 25 mg by mouth daily.     [provider]  irbesartan (AVAPRO) 150 MG tablet Take 150 mg by mouth daily. 02/07/18   [provider]  metoprolol (LOPRESSOR) 50 MG tablet Take 75 mg by mouth 2 (two) times daily.     [provider]  Multiple Vitamin (MULTIVITAMIN) tablet Take 1 tablet by mouth daily.    [provider]  neomycin-polymyxin b-dexamethasone (MAXITROL) 3.5-10000-0.1 OINT Place 1 application into both eyes at bedtime. 12/25/17   [provider]  Omega-3 Fatty Acids (FISH OIL) 1000 MG CAPS Take 1,000 mg by mouth daily.    [provider]  pantoprazole (PROTONIX) 40 MG tablet Take 40 mg by mouth daily.     [provider]  polyethylene glycol (MIRALAX / GLYCOLAX) packet 17grams in 16 oz of water twice a day until bowel movement.  LAXITIVE.  Restart if two days since last bowel movement Patient not taking: Reported on 03/11/2018 06/15/15   Shepperson, Kirstin, PA-C  simvastatin (ZOCOR) 20 MG tablet Take 20 mg by mouth at bedtime.     [provider]    Allergies    Latex, Nsaids, and Percocet [oxycodone-acetaminophen]  Review of Systems   Review of Systems  Constitutional: Negative for chills and fever.  HENT: Negative for ear pain and sore throat.   Eyes: Negative for visual disturbance.  Respiratory: Negative for cough and shortness of breath.   Cardiovascular: Negative for chest pain.  Gastrointestinal: Positive for abdominal pain and diarrhea. Negative for constipation, nausea and vomiting.  Genitourinary: Negative for dysuria and hematuria.  Musculoskeletal:  Negative for back pain.  Skin: Negative for rash.  Neurological: Negative for headaches.  All other systems reviewed and are negative.   Physical Exam Updated Vital Signs BP (!) 154/71   Pulse (!) 52   Temp 97.8 F (36.6 C) (Oral)   Resp 15   SpO2 100%   Physical Exam Vitals and nursing note reviewed.  Constitutional:      General: She is not in acute distress.    Appearance: She is well-developed and well-nourished.  HENT:     Head: Normocephalic and atraumatic.  Eyes:     Conjunctiva/sclera: Conjunctivae normal.  Cardiovascular:  Rate and Rhythm: Normal rate and regular rhythm.     Heart sounds: Normal heart sounds. No murmur heard.   Pulmonary:     Effort: Pulmonary effort is normal. No respiratory distress.     Breath sounds: Normal breath sounds. No wheezing, rhonchi or rales.  Abdominal:     General: Bowel sounds are normal.     Palpations: Abdomen is soft.     Tenderness: There is abdominal tenderness in the right lower quadrant. There is guarding.  Musculoskeletal:        General: No edema.     Cervical back: Neck supple.  Skin:    General: Skin is warm and dry.  Neurological:     Mental Status: She is alert.  Psychiatric:        Mood and Affect: Mood and affect normal.     ED Results / Procedures / Treatments   Labs (all labs ordered are listed, but only abnormal results are displayed) Labs Reviewed  COMPREHENSIVE METABOLIC PANEL - Abnormal; Notable for the following components:      Result Value   Chloride 97 (*)    All other components within normal limits  CBC - Abnormal; Notable for the following components:   Hemoglobin 15.8 (*)    HCT 46.1 (*)    All other components within normal limits  LIPASE, BLOOD  URINALYSIS, ROUTINE W REFLEX MICROSCOPIC    EKG None  Radiology No results found.  Procedures Procedures   Medications Ordered in ED Medications  ketorolac (TORADOL) 30 MG/ML injection 30 mg (30 mg Intravenous Given 10/31/20  2100)  ondansetron (ZOFRAN) injection 4 mg (4 mg Intravenous Given 10/31/20 2100)  sodium chloride 0.9 % bolus 1,000 mL (1,000 mLs Intravenous New Bag/Given 10/31/20 2053)  iohexol (OMNIPAQUE) 300 MG/ML solution 100 mL (100 mLs Intravenous Contrast Given 10/31/20 2156)    ED Course  I have reviewed the triage vital signs and the nursing notes.  Pertinent labs & imaging results that were available during my care of the patient were reviewed by me and considered in my medical decision making (see chart for details).    MDM Rules/Calculators/A&P                          70 y/o F presenting to the ED today for eval of abd pain.  Reviewed/interpreted labs CBC w/o significant leukocytosis, mildly elevated hgb which appears chronic CMP unremarkable Lipase wnl UA neg for UTI  CT abd/pelvis pending at shift change  At shift change, care transitioned to [facility], PA-C to f/u on pending CT abd/pelvis. If neg, pt likely safe for dc home with symptomatic tx.    Final Clinical Impression(s) / ED Diagnoses Final diagnoses:  Abdominal pain, unspecified abdominal location    Rx / DC Orders ED Discharge Orders    None       Bishop Dublin 10/31/20 2202    Valarie Merino, MD 11/01/20 1452

## 2020-11-01 NOTE — ED Provider Notes (Signed)
12:00 AM Care assumed at shift change from [place], [place].  Patient pending CT scan to further evaluate cause of abdominal pain.  CT results show findings suggestive of peptic ulcer disease.  Patient does have known history of esophageal reflux.  No other acute abnormality identified in the abdomen or pelvis.  Patient appears comfortable on repeat assessment.  Have encouraged follow-up with her primary care doctor.  Patient verbalizes understanding.  Discharged in stable condition with no unaddressed concerns.  CT ABDOMEN PELVIS W CONTRAST  Result Date: 10/31/2020 CLINICAL DATA:  Right lower quadrant abdominal pain for 2 days. EXAM: CT ABDOMEN AND PELVIS WITH CONTRAST TECHNIQUE: Multidetector CT imaging of the abdomen and pelvis was performed using the standard protocol following bolus administration of intravenous contrast. CONTRAST:  130mL OMNIPAQUE IOHEXOL 300 MG/ML  SOLN COMPARISON:  None. FINDINGS: Lower chest: Hypoventilatory changes in the lung bases. No pleural fluid. Hepatobiliary: Mild diffusely decreased hepatic density typical of steatosis. No focal hepatic lesion. Cholecystectomy without biliary dilatation. Pancreas: No ductal dilatation or inflammation. Spleen: Normal in size without focal abnormality. Adrenals/Urinary Tract: Left adrenal thickening. Normal right adrenal gland. No hydronephrosis or perinephric edema. Homogeneous renal enhancement with symmetric excretion on delayed phase imaging. Urinary bladder is physiologically distended without wall thickening. Stomach/Bowel: Cecum is located in the midline pelvis. The appendix courses posteriorly and is normal. No appendicitis. Questionable pre pyloric gastric wall thickening. There is no small bowel obstruction, wall thickening, or inflammation. Terminal ileum is normal. Moderate volume of stool throughout the colon. Diverticulosis of the sigmoid colon. No diverticulitis or colonic wall thickening. Vascular/Lymphatic: Aorto bi-iliac  atherosclerosis. No aortic aneurysm. Patent portal vein. No abdominopelvic adenopathy. Reproductive: Normal quiescent uterus for age. Normal quiescent ovaries. No adnexal mass. Other: No free air, free fluid, or intra-abdominal fluid collection. Tiny fat containing umbilical hernia. Musculoskeletal: Degenerative disc disease at L5-S1. There are no acute or suspicious osseous abnormalities. IMPRESSION: 1. Questionable pre pyloric gastric wall thickening, can be seen with gastritis or peptic ulcer disease. 2. Normal appendix. No focal abnormality in the right lower quadrant. 3. Mild hepatic steatosis. 4. Sigmoid colonic diverticulosis without diverticulitis. Aortic Atherosclerosis (ICD10-I70.0). Electronically Signed   By: Keith Rake M.D.   On: 10/31/2020 22:21      Antonietta Breach, PA-C 11/01/20 0001    Valarie Merino, MD 11/01/20 1452

## 2020-11-10 DIAGNOSIS — R42 Dizziness and giddiness: Secondary | ICD-10-CM | POA: Diagnosis not present

## 2020-11-10 DIAGNOSIS — K219 Gastro-esophageal reflux disease without esophagitis: Secondary | ICD-10-CM | POA: Diagnosis not present

## 2020-11-10 DIAGNOSIS — M25551 Pain in right hip: Secondary | ICD-10-CM | POA: Diagnosis not present

## 2020-11-10 DIAGNOSIS — R1031 Right lower quadrant pain: Secondary | ICD-10-CM | POA: Diagnosis not present

## 2020-12-07 ENCOUNTER — Ambulatory Visit: Payer: Medicare Other | Attending: Family Medicine

## 2020-12-07 ENCOUNTER — Other Ambulatory Visit: Payer: Self-pay

## 2020-12-07 DIAGNOSIS — R42 Dizziness and giddiness: Secondary | ICD-10-CM | POA: Diagnosis not present

## 2020-12-07 DIAGNOSIS — R2689 Other abnormalities of gait and mobility: Secondary | ICD-10-CM

## 2020-12-07 DIAGNOSIS — R2681 Unsteadiness on feet: Secondary | ICD-10-CM | POA: Diagnosis not present

## 2020-12-07 NOTE — Therapy (Signed)
[facility] [address] [place], [place], [address] Phone: [phone]   Fax:  [phone]  Physical Therapy Evaluation  Patient Details  Name: Kristen Wilson MRN: [id number] Date of Birth: June 25, 1951 Referring Provider (PT): [name], MD   Encounter Date: 12/07/2020   PT End of Session - 12/07/20 1451    Visit Number 1    Number of Visits 7    Date for PT Re-Evaluation 02/05/21   POC for 6 weeks, Cert for 60 days   Authorization Type [employer] Medicare (FOTO, 10th Visit PN)    Progress Note Due on Visit 10    PT Start Time 1447    PT Stop Time 1530    PT Time Calculation (min) 43 min    Activity Tolerance Patient tolerated treatment well    Behavior During Therapy [facility] for tasks assessed/performed           Past Medical History:  Diagnosis Date  . Allergy   . Anginal pain (HCC)    hx- non recent - r/t anxiety/panic attack  . Anxiety   . Arthritis    "Whole body"  . Bronchitis    uses inhaler prn  . Cough    smoker s cough  . Depression   . Diverticulitis   . GERD (gastroesophageal reflux disease)   . Hiatal hernia   . Hyperlipidemia   . Hypertension   . IBS (irritable bowel syndrome)   . Osteoporosis   . Pneumonia    hx  . Primary localized osteoarthritis of left knee   . Shortness of breath    occ  . Smoker   . SVD (spontaneous vaginal delivery)    x 2  . Upper respiratory tract infection 06/14/2015   Starting IV Rocephin today also starting nebulizers for wheezing    Past Surgical History:  Procedure Laterality Date  . CHOLECYSTECTOMY    . COLONOSCOPY N/A 03/20/2013   Procedure: COLONOSCOPY;  Surgeon: Wonda Horner, MD;  Location: [facility];  Service: Endoscopy;  Laterality: N/A;  . CYST EXCISION PERINEAL  03/20/2018   Procedure: INCISION AND DRAINAGE OF PERINEAL CYST;  Surgeon: Brien Few, MD;  Location: [facility];  Service: Gynecology;;  . DILATION AND CURETTAGE OF UTERUS    .  KNEE ARTHROSCOPY     left  . TONSILLECTOMY    . 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   Procedure: LEFT TOTAL KNEE ARTHROPLASTY;  Surgeon: Elsie Saas, MD;  Location: [facility];  Service: Orthopedics;  Laterality: Left;  . TUBAL LIGATION    . UPPER GI ENDOSCOPY     reflux, hiatal hernia  . VULVECTOMY N/A 03/20/2018   Procedure: WIDE EXCISION VULVECTOMY;  Surgeon: Brien Few, MD;  Location: [facility];  Service: Gynecology;  Laterality: N/A;  . WISDOM TOOTH EXTRACTION      There were no vitals filed for this visit.    Subjective Assessment - 12/07/20 1454    Subjective Patient reports dizziness that started approx one year ago. She reports good/bad days, unsure of what causes dizziness. Has stopped going to church due to the dizziness. Patient reports dizziness at times when standing and with walking. Patient often reports having to hold onto furniture/walls at time. Currently ambulating with SPC into session, reports she also uses in the home. Reports that the falls has been caused by the dizziness.    Pertinent History SOB, OA, Osteoporosis, HTN, Hyperlipidemia, GERD, Depression, Anxiety    Limitations Standing;Walking    Currently  in Pain? No/denies              [facility] PT Assessment - 12/07/20 0001      Assessment   Medical Diagnosis Vertigo    Referring Provider (PT) [name], MD    Onset Date/Surgical Date 11/10/20    Prior Therapy None      Precautions   Precautions Fall    Precaution Comments SOB, OA, Osteoporosis, HTN, Hyperlipidemia, GERD, Depression, Anxiety      Balance Screen   Has the patient fallen in the past 6 months Yes    How many times? 4    Has the patient had a decrease in activity level because of a fear of falling?  Yes    Is the patient reluctant to leave their home because of a fear of falling?  Yes      Home Environment   Living Environment Private residence    Living Arrangements Other relatives;Other (Comment)   Sister   Available Help at  Discharge [place] - single point      Prior Function   Level of Independence Independent with household mobility with device;Independent with community mobility with device    Vocation Retired    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      Cognition   Overall Cognitive Status Within Functional Limits for tasks assessed      Observation/Other Assessments   Focus on Therapeutic Outcomes (FOTO)  DPS: 49, DFS: 42.3      Sensation   Light Touch Appears Intact      ROM / Strength   AROM / PROM / Strength Strength      Strength   Overall Strength Deficits    Overall Strength Comments general decreased weakness in BUE 4/5; general weakness in BLE 4-/5      Transfers   Transfers Sit to Stand;Stand to Sit    Sit to Stand 5: Supervision;From chair/3-in-1;With upper extremity assist    Stand to Sit 5: Supervision;To chair/3-in-1;With upper extremity assist      Ambulation/Gait   Ambulation/Gait Yes    Ambulation/Gait Assistance 5: Supervision    Ambulation/Gait Assistance Details patient currently ambulating into therapy evaluation and around therapy gym with SPC. slow cautious gait noted with ambulation. supervision throughout session with SPC.    Ambulation Distance (Feet) 50 Feet    Assistive device Straight cane    Gait Pattern Step-through pattern;Decreased arm swing - right;Decreased arm swing - left;Decreased step length - right;Decreased step length - left;Narrow base of support    Ambulation Surface Level;Indoor                  Vestibular Assessment - 12/07/20 0001      Symptom Behavior   Subjective history of current problem 1 year history of chronic dizziness, reports intermittent spinning sensation at times. Most offten feels off balanced. Patient reports some relief from supine position. Denies vision changes. Patient reports intermittent fullness in the L ear.    Type of Dizziness   Imbalance;Spinning;Unsteady with head/body turns;Vertigo    Frequency of Dizziness 3-4x/week    Duration of Dizziness typically lasts all day when she is having a bad day    Symptom Nature Motion provoked;Intermittent;Positional    Aggravating Factors Sit to stand;Activity in general    Relieving Factors Lying supine    Progression of Symptoms Worse      Oculomotor Exam   Oculomotor  Alignment Normal    Ocular ROM WNL    Spontaneous Absent    Gaze-induced  Absent    Smooth Pursuits Intact    Saccades Poor trajectory      Oculomotor Exam-Fixation Suppressed    Left Head Impulse unable to assess due patient inability to allow PROM    Right Head Impulse unable to assess due patient inability to allow PROM      Vestibulo-Ocular Reflex   VOR 1 Head Only (x 1 viewing) mild dizziness. Able to maintain gaze with completion.      Positional Testing   Sidelying Test Sidelying Right;Sidelying Left    Horizontal Canal Testing Horizontal Canal Right;Horizontal Canal Left      Sidelying Right   Sidelying Right Duration 0    Sidelying Right Symptoms No nystagmus      Sidelying Left   Sidelying Left Duration 15 seconds of reported dizziness/nauseousness    Sidelying Left Symptoms No nystagmus      Horizontal Canal Right   Horizontal Canal Right Duration 0    Horizontal Canal Right Symptoms Normal      Horizontal Canal Left   Horizontal Canal Left Duration 0    Horizontal Canal Left Symptoms Normal      Positional Sensitivities   Sit to Supine No dizziness    Supine to Sitting Lightheadedness   increased lightheadedness             Objective measurements completed on examination: See above findings.               PT Education - 12/07/20 1454    Education Details Educated on [employer].    Person(s) Educated Patient    Methods Explanation    Comprehension Verbalized understanding            PT Short Term Goals - 12/07/20 1908      PT SHORT TERM  GOAL #1   Title Patient will undergo further vestibular assesment (Orthostatics/DVA/M-CTSIB) and LTG to be set as applicable (ALL STGs Due: 12/28/20)    Baseline TBA    Time 3    Period Weeks    Status New    Target Date 12/28/20             PT Long Term Goals - 12/07/20 1911      PT LONG TERM GOAL #1   Title Patient will be independent with vestibular/balance HEP (ALL LTGs Due: 01/18/21)    Baseline no HEP established    Time 6    Period Weeks    Status New    Target Date 01/18/21      PT LONG TERM GOAL #2   Title Patient will report no dizziness with bed mobility and functional activities for imprvoed activity tolerance    Baseline mild dizziness    Time 6    Period Weeks    Status New      PT LONG TERM GOAL #3   Title LTG to be set for further vestibular assesment    Baseline TBA    Time 6    Period Weeks    Status New      PT LONG TERM GOAL #4   Title Patient will improve DPS > 55 and DFS > 47    Baseline DPS: 49, DFS: 42.3    Time 6    Period Weeks    Status New  Plan - 12/07/20 1918    Clinical Impression Statement Patient is a 70 y.o. female that was referred to Neuro OPPT services for Dizziness. Patient's PMH significant for the following: SOB, OA, Osteoporosis, HTN, Hyperlipidemia, GERD, Depression, Anxiety. Patient is curently ambulating with SPC at this time due to dizziness/imbalance. Upon evaluation patient presents with the following impairments: Dizziness, Abnormal Gait, impaired balance, and subjective vertigo with positional testing. No nsytagmus noticed with positional testing, however patient reported spinning/vertigo with L Sidelying test. Patient having difficulty focusing with VOR, however unable to assess DVA today due to patient not having glassess during session. Patient will benefit from skilled PT services to address impairments, improved functioanl mobility, and reduce fall risk.    Personal Factors and Comorbidities  Age;Comorbidity 3+    Comorbidities SOB, OA, Osteoporosis, HTN, Hyperlipidemia, GERD, Depression, Anxiety    Examination-Activity Limitations Bed Mobility;Bend;Reach Overhead;Locomotion Level    Examination-Participation Restrictions Occupation;Community Activity;Driving    Stability/Clinical Decision Making Evolving/Moderate complexity    Clinical Decision Making Moderate    Rehab Potential Fair    PT Frequency 1x / week    PT Duration 6 weeks    PT Treatment/Interventions ADLs/Self Care Home Management;Canalith Repostioning;Cryotherapy;Moist Heat;DME Instruction;Gait training;Stair training;Functional mobility training;Therapeutic activities;Neuromuscular re-education;Balance training;Therapeutic exercise;Patient/family education;Manual techniques;Passive range of motion;Dry needling;Vestibular    PT Next Visit Plan Assess DVA/ M-CTSIB/Orthostatics Reassess Positional testing and treat as indicated. Initiate HEP focused on VOR x 1.    Consulted and Agree with Plan of Care Patient           Patient will benefit from skilled therapeutic intervention in order to improve the following deficits and impairments:  Abnormal gait,Decreased balance,Decreased activity tolerance,Dizziness,Difficulty walking,Decreased strength  Visit Diagnosis: Unsteadiness on feet  Dizziness and giddiness  Other abnormalities of gait and mobility     Problem List Patient Active Problem List   Diagnosis Date Noted  . Upper respiratory tract infection 06/14/2015  . DJD (degenerative joint disease) of knee 06/13/2015  . Primary localized osteoarthritis of left knee   . Hiatal hernia   . Arthritis   . Allergy   . Depression   . GERD (gastroesophageal reflux disease)   . Osteoporosis   . Shortness of breath   . IBS (irritable bowel syndrome)   . Hypertension 11/02/2011  . Chest pain 11/02/2011  . Hyperlipidemia     Jones Bales, PT, DPT 12/07/2020, 7:25 PM  [facility] [address] [place], [place], [address] Phone: [phone]   Fax:  [phone]  Name: Kristen Wilson MRN: [id number] Date of Birth: 04/02/1951

## 2020-12-22 ENCOUNTER — Ambulatory Visit: Payer: Medicare Other | Admitting: Physical Therapy

## 2020-12-23 DIAGNOSIS — J449 Chronic obstructive pulmonary disease, unspecified: Secondary | ICD-10-CM | POA: Diagnosis not present

## 2020-12-23 DIAGNOSIS — K219 Gastro-esophageal reflux disease without esophagitis: Secondary | ICD-10-CM | POA: Diagnosis not present

## 2020-12-23 DIAGNOSIS — E782 Mixed hyperlipidemia: Secondary | ICD-10-CM | POA: Diagnosis not present

## 2020-12-23 DIAGNOSIS — J410 Simple chronic bronchitis: Secondary | ICD-10-CM | POA: Diagnosis not present

## 2020-12-23 DIAGNOSIS — J441 Chronic obstructive pulmonary disease with (acute) exacerbation: Secondary | ICD-10-CM | POA: Diagnosis not present

## 2020-12-23 DIAGNOSIS — I10 Essential (primary) hypertension: Secondary | ICD-10-CM | POA: Diagnosis not present

## 2020-12-29 ENCOUNTER — Ambulatory Visit: Payer: Medicare Other | Attending: Family Medicine | Admitting: Physical Therapy

## 2021-01-05 ENCOUNTER — Ambulatory Visit: Payer: Medicare Other | Admitting: Physical Therapy

## 2021-01-12 ENCOUNTER — Encounter: Payer: Medicare Other | Admitting: Physical Therapy

## 2021-01-16 DIAGNOSIS — I10 Essential (primary) hypertension: Secondary | ICD-10-CM | POA: Diagnosis not present

## 2021-01-16 DIAGNOSIS — E782 Mixed hyperlipidemia: Secondary | ICD-10-CM | POA: Diagnosis not present

## 2021-01-16 DIAGNOSIS — J449 Chronic obstructive pulmonary disease, unspecified: Secondary | ICD-10-CM | POA: Diagnosis not present

## 2021-01-16 DIAGNOSIS — J410 Simple chronic bronchitis: Secondary | ICD-10-CM | POA: Diagnosis not present

## 2021-01-16 DIAGNOSIS — J441 Chronic obstructive pulmonary disease with (acute) exacerbation: Secondary | ICD-10-CM | POA: Diagnosis not present

## 2021-01-16 DIAGNOSIS — K219 Gastro-esophageal reflux disease without esophagitis: Secondary | ICD-10-CM | POA: Diagnosis not present

## 2021-01-25 NOTE — Therapy (Signed)
[facility] [address] [place], [place], [address] Phone: [phone]   Fax:  [phone]  Patient Details  Name: Kristen Wilson MRN: [id number] Date of Birth: Jun 16, 1951 Referring Provider:  No ref. provider found  Encounter Date: 01/25/2021  PHYSICAL THERAPY DISCHARGE SUMMARY  Visits from Start of Care: 1  Current functional level related to goals / functional outcomes: Patient cancelled all remaining appointment due to reports she is feeling much better. Patient being discharged from PT services at this time, and will need new referral to return to PT services.    Remaining deficits: None   Education / Equipment: None  Plan: Patient agrees to discharge.  Patient goals were not met. Patient is being discharged due to the patient's request.  ?????      PT Short Term Goals - 12/07/20 1908      PT SHORT TERM GOAL #1   Title Patient will undergo further vestibular assesment (Orthostatics/DVA/M-CTSIB) and LTG to be set as applicable (ALL STGs Due: 12/28/20)    Baseline TBA    Time 3    Period Weeks    Status New    Target Date 12/28/20           PT Long Term Goals - 12/07/20 1911      PT LONG TERM GOAL #1   Title Patient will be independent with vestibular/balance HEP (ALL LTGs Due: 01/18/21)    Baseline no HEP established    Time 6    Period Weeks    Status New    Target Date 01/18/21      PT LONG TERM GOAL #2   Title Patient will report no dizziness with bed mobility and functional activities for imprvoed activity tolerance    Baseline mild dizziness    Time 6    Period Weeks    Status New      PT LONG TERM GOAL #3   Title LTG to be set for further vestibular assesment    Baseline TBA    Time 6    Period Weeks    Status New      PT LONG TERM GOAL #4   Title Patient will improve DPS > 55 and DFS > 47    Baseline DPS: 49, DFS: 42.3    Time 6    Period Weeks    Status New             Jones Bales, PT, DPT 01/25/2021, 8:11 AM  [facility] [address] [place], [place], [address] Phone: [phone]   Fax:  [phone]

## 2021-02-02 DIAGNOSIS — Z1231 Encounter for screening mammogram for malignant neoplasm of breast: Secondary | ICD-10-CM | POA: Diagnosis not present

## 2021-02-02 DIAGNOSIS — Z780 Asymptomatic menopausal state: Secondary | ICD-10-CM | POA: Diagnosis not present

## 2021-02-02 DIAGNOSIS — M85852 Other specified disorders of bone density and structure, left thigh: Secondary | ICD-10-CM | POA: Diagnosis not present

## 2021-03-16 DIAGNOSIS — M1711 Unilateral primary osteoarthritis, right knee: Secondary | ICD-10-CM | POA: Diagnosis not present

## 2021-03-26 DIAGNOSIS — E782 Mixed hyperlipidemia: Secondary | ICD-10-CM | POA: Diagnosis not present

## 2021-03-26 DIAGNOSIS — I10 Essential (primary) hypertension: Secondary | ICD-10-CM | POA: Diagnosis not present

## 2021-03-26 DIAGNOSIS — J441 Chronic obstructive pulmonary disease with (acute) exacerbation: Secondary | ICD-10-CM | POA: Diagnosis not present

## 2021-03-26 DIAGNOSIS — J449 Chronic obstructive pulmonary disease, unspecified: Secondary | ICD-10-CM | POA: Diagnosis not present

## 2021-03-26 DIAGNOSIS — K219 Gastro-esophageal reflux disease without esophagitis: Secondary | ICD-10-CM | POA: Diagnosis not present

## 2021-03-26 DIAGNOSIS — J410 Simple chronic bronchitis: Secondary | ICD-10-CM | POA: Diagnosis not present

## 2021-04-17 DIAGNOSIS — K219 Gastro-esophageal reflux disease without esophagitis: Secondary | ICD-10-CM | POA: Diagnosis not present

## 2021-04-17 DIAGNOSIS — E559 Vitamin D deficiency, unspecified: Secondary | ICD-10-CM | POA: Diagnosis not present

## 2021-04-17 DIAGNOSIS — E538 Deficiency of other specified B group vitamins: Secondary | ICD-10-CM | POA: Diagnosis not present

## 2021-04-17 DIAGNOSIS — M85859 Other specified disorders of bone density and structure, unspecified thigh: Secondary | ICD-10-CM | POA: Diagnosis not present

## 2021-04-17 DIAGNOSIS — R001 Bradycardia, unspecified: Secondary | ICD-10-CM | POA: Diagnosis not present

## 2021-04-17 DIAGNOSIS — K580 Irritable bowel syndrome with diarrhea: Secondary | ICD-10-CM | POA: Diagnosis not present

## 2021-04-17 DIAGNOSIS — I10 Essential (primary) hypertension: Secondary | ICD-10-CM | POA: Diagnosis not present

## 2021-04-17 DIAGNOSIS — E782 Mixed hyperlipidemia: Secondary | ICD-10-CM | POA: Diagnosis not present

## 2021-04-17 DIAGNOSIS — J449 Chronic obstructive pulmonary disease, unspecified: Secondary | ICD-10-CM | POA: Diagnosis not present

## 2021-04-24 DIAGNOSIS — M85852 Other specified disorders of bone density and structure, left thigh: Secondary | ICD-10-CM | POA: Diagnosis not present

## 2021-04-24 DIAGNOSIS — R339 Retention of urine, unspecified: Secondary | ICD-10-CM | POA: Diagnosis not present

## 2021-04-24 DIAGNOSIS — E782 Mixed hyperlipidemia: Secondary | ICD-10-CM | POA: Diagnosis not present

## 2021-04-24 DIAGNOSIS — E538 Deficiency of other specified B group vitamins: Secondary | ICD-10-CM | POA: Diagnosis not present

## 2021-04-24 DIAGNOSIS — R42 Dizziness and giddiness: Secondary | ICD-10-CM | POA: Diagnosis not present

## 2021-04-24 DIAGNOSIS — M85859 Other specified disorders of bone density and structure, unspecified thigh: Secondary | ICD-10-CM | POA: Diagnosis not present

## 2021-04-24 DIAGNOSIS — F17210 Nicotine dependence, cigarettes, uncomplicated: Secondary | ICD-10-CM | POA: Diagnosis not present

## 2021-04-24 DIAGNOSIS — Z0000 Encounter for general adult medical examination without abnormal findings: Secondary | ICD-10-CM | POA: Diagnosis not present

## 2021-04-24 DIAGNOSIS — I10 Essential (primary) hypertension: Secondary | ICD-10-CM | POA: Diagnosis not present

## 2021-04-24 DIAGNOSIS — J449 Chronic obstructive pulmonary disease, unspecified: Secondary | ICD-10-CM | POA: Diagnosis not present

## 2021-04-24 DIAGNOSIS — K219 Gastro-esophageal reflux disease without esophagitis: Secondary | ICD-10-CM | POA: Diagnosis not present

## 2021-05-19 DIAGNOSIS — J441 Chronic obstructive pulmonary disease with (acute) exacerbation: Secondary | ICD-10-CM | POA: Diagnosis not present

## 2021-05-19 DIAGNOSIS — J410 Simple chronic bronchitis: Secondary | ICD-10-CM | POA: Diagnosis not present

## 2021-05-19 DIAGNOSIS — I10 Essential (primary) hypertension: Secondary | ICD-10-CM | POA: Diagnosis not present

## 2021-05-19 DIAGNOSIS — J449 Chronic obstructive pulmonary disease, unspecified: Secondary | ICD-10-CM | POA: Diagnosis not present

## 2021-05-19 DIAGNOSIS — E782 Mixed hyperlipidemia: Secondary | ICD-10-CM | POA: Diagnosis not present

## 2021-05-19 DIAGNOSIS — K219 Gastro-esophageal reflux disease without esophagitis: Secondary | ICD-10-CM | POA: Diagnosis not present

## 2021-05-23 DIAGNOSIS — E538 Deficiency of other specified B group vitamins: Secondary | ICD-10-CM | POA: Diagnosis not present

## 2021-05-23 DIAGNOSIS — R339 Retention of urine, unspecified: Secondary | ICD-10-CM | POA: Diagnosis not present

## 2021-05-23 DIAGNOSIS — J449 Chronic obstructive pulmonary disease, unspecified: Secondary | ICD-10-CM | POA: Diagnosis not present

## 2021-05-23 DIAGNOSIS — M85859 Other specified disorders of bone density and structure, unspecified thigh: Secondary | ICD-10-CM | POA: Diagnosis not present

## 2021-05-23 DIAGNOSIS — M85852 Other specified disorders of bone density and structure, left thigh: Secondary | ICD-10-CM | POA: Diagnosis not present

## 2021-05-23 DIAGNOSIS — F17210 Nicotine dependence, cigarettes, uncomplicated: Secondary | ICD-10-CM | POA: Diagnosis not present

## 2021-05-23 DIAGNOSIS — R42 Dizziness and giddiness: Secondary | ICD-10-CM | POA: Diagnosis not present

## 2021-06-01 ENCOUNTER — Other Ambulatory Visit: Payer: Self-pay | Admitting: *Deleted

## 2021-06-01 DIAGNOSIS — Z87891 Personal history of nicotine dependence: Secondary | ICD-10-CM

## 2021-06-01 DIAGNOSIS — F17210 Nicotine dependence, cigarettes, uncomplicated: Secondary | ICD-10-CM

## 2021-06-21 ENCOUNTER — Ambulatory Visit (INDEPENDENT_AMBULATORY_CARE_PROVIDER_SITE_OTHER): Payer: Medicare Other | Admitting: Acute Care

## 2021-06-21 ENCOUNTER — Other Ambulatory Visit: Payer: Self-pay

## 2021-06-21 ENCOUNTER — Encounter: Payer: Self-pay | Admitting: Acute Care

## 2021-06-21 DIAGNOSIS — F17210 Nicotine dependence, cigarettes, uncomplicated: Secondary | ICD-10-CM | POA: Diagnosis not present

## 2021-06-21 NOTE — Progress Notes (Signed)
Virtual Visit via Telephone Note  I connected with Kristen Wilson on 06/21/21 at 10:00 AM EDT by telephone and verified that I am speaking with the correct person using two identifiers.  Location: Patient: At home Provider:  [place], [place], [place], [address]    I discussed the limitations, risks, security and privacy concerns of performing an evaluation and management service by telephone and the availability of in person appointments. I also discussed with the patient that there may be a patient responsible charge related to this service. The patient expressed understanding and agreed to proceed.    Shared Decision Making Visit Lung Cancer Screening Program [phone])   Eligibility: Age 70 y.o. Pack Years Smoking History Calculation 51 pack year smoking history (# packs/per year x # years smoked) Recent History of coughing up blood  no Unexplained weight loss? no ( >Than 15 pounds within the last 6 months ) Prior History Lung / other cancer no (Diagnosis within the last 5 years already requiring surveillance chest CT Scans). Smoking Status Current Smoker Former Smokers: Years since quit:  NA  Quit Date:  NA  Visit Components: Discussion included one or more decision making aids. yes Discussion included risk/benefits of screening. yes Discussion included potential follow up diagnostic testing for abnormal scans. yes Discussion included meaning and risk of over diagnosis. yes Discussion included meaning and risk of False Positives. yes Discussion included meaning of total radiation exposure. yes  Counseling Included: Importance of adherence to annual lung cancer LDCT screening. yes Impact of comorbidities on ability to participate in the program. yes Ability and willingness to under diagnostic treatment. yes  Smoking Cessation Counseling: Current Smokers:  Discussed importance of smoking cessation. yes Information about tobacco cessation classes and  interventions provided to patient. yes Patient provided with "ticket" for LDCT Scan. yes Symptomatic Patient. no  Counseling NA Diagnosis Code: Tobacco Use Z72.0 Asymptomatic Patient yes  Counseling (Intermediate counseling: > three minutes counseling) B8466 Former Smokers:  Discussed the importance of maintaining cigarette abstinence. yes Diagnosis Code: Personal History of Nicotine Dependence. Z99.357 Information about tobacco cessation classes and interventions provided to patient. Yes Patient provided with "ticket" for LDCT Scan. yes Written Order for Lung Cancer Screening with LDCT placed in Epic. Yes (CT Chest Lung Cancer Screening Low Dose W/O CM) [id number] Z12.2-Screening of respiratory organs Z87.891-Personal history of nicotine dependence  I have spent 25 minutes of face to face time with Kristen Wilson discussing the risks and benefits of lung cancer screening. We viewed a power point together that explained in detail the above noted topics. We paused at intervals to allow for questions to be asked and answered to ensure understanding.We discussed that the single most powerful action that she can take to decrease her risk of developing lung cancer is to quit smoking. We discussed whether or not she is ready to commit to setting a quit date. We discussed options for tools to aid in quitting smoking including nicotine replacement therapy, non-nicotine medications, support groups, Quit Smart classes, and behavior modification. We discussed that often times setting smaller, more achievable goals, such as eliminating 1 cigarette a day for a week and then 2 cigarettes a day for a week can be helpful in slowly decreasing the number of cigarettes smoked. This allows for a sense of accomplishment as well as providing a clinical benefit. I gave her the " Be Stronger Than Your Excuses" card with contact information for community resources, classes, free nicotine replacement therapy, and access to  mobile  apps, text messaging, and on-line smoking cessation help. I have also given her my card and contact information in the event she needs to contact me. We discussed the time and location of the scan, and that either Doroteo Glassman RN or I will call with the results within 24-48 hours of receiving them. I have offered her  a copy of the power point we viewed  as a resource in the event they need reinforcement of the concepts we discussed today in the office. The patient verbalized understanding of all of  the above and had no further questions upon leaving the office. They have my contact information in the event they have any further questions.  I spent 3 minutes counseling on smoking cessation and the health risks of continued tobacco abuse.  I explained to the patient that there has been a high incidence of coronary artery disease noted on these exams. I explained that this is a non-gated exam therefore degree or severity cannot be determined. This patient is currently on statin therapy. I have asked the patient to follow-up with their PCP regarding any incidental finding of coronary artery disease and management with diet or medication as their PCP  feels is clinically indicated. The patient verbalized understanding of the above and had no further questions upon completion of the visit.      Magdalen Spatz, NP 06/21/2021

## 2021-06-21 NOTE — Patient Instructions (Signed)
Thank you for participating in the [facility] Screening [facility]. It was our pleasure to meet you today. We will call you with the results of your scan within the next few days. Your scan will be assigned a Lung RADS category score by the physicians reading the scans.  This Lung RADS score determines follow up scanning.  See below for description of categories, and follow up screening recommendations. We will be in touch to schedule your follow up screening annually or based on recommendations of our providers. We will fax a copy of your scan results to your Primary Care Physician, or the physician who referred you to the program, to ensure they have the results. Please call the office if you have any questions or concerns regarding your scanning experience or results.  Our office number is [phone]. Please speak with Denise Phelps, RN. She is our Lung Cancer Screening RN. If she is unavailable when you call, please have the office staff send her a message. She will return your call at her earliest convenience. Remember, if your scan is normal, we will scan you annually as long as you continue to meet the criteria for the program. (Age 55-77, Current smoker or smoker who has quit within the last 15 years). If you are a smoker, remember, quitting is the single most powerful action that you can take to decrease your risk of lung cancer and other pulmonary, breathing related problems. We know quitting is hard, and we are here to help.  Please let us know if there is anything we can do to help you meet your goal of quitting. If you are a former smoker, congratulations. We are proud of you! Remain smoke free! Remember you can refer friends or family members through the number above.  We will screen them to make sure they meet criteria for the program. Thank you for helping us take better care of you by participating in Lung Screening.  Lung RADS Categories:  Lung RADS 1: no nodules  or definitely non-concerning nodules.  Recommendation is for a repeat annual scan in 12 months.  Lung RADS 2:  nodules that are non-concerning in appearance and behavior with a very low likelihood of becoming an active cancer. Recommendation is for a repeat annual scan in 12 months.  Lung RADS 3: nodules that are probably non-concerning , includes nodules with a low likelihood of becoming an active cancer.  Recommendation is for a 6-month repeat screening scan. Often noted after an upper respiratory illness. We will be in touch to make sure you have no questions, and to schedule your 6-month scan.  Lung RADS 4 A: nodules with concerning findings, recommendation is most often for a follow up scan in 3 months or additional testing based on our provider's assessment of the scan. We will be in touch to make sure you have no questions and to schedule the recommended 3 month follow up scan.  Lung RADS 4 B:  indicates findings that are concerning. We will be in touch with you to schedule additional diagnostic testing based on our provider's  assessment of the scan.   

## 2021-06-22 ENCOUNTER — Ambulatory Visit
Admission: RE | Admit: 2021-06-22 | Discharge: 2021-06-22 | Disposition: A | Discharge status: Home / Self Care | Payer: Medicare Other | Source: Ambulatory Visit | Attending: Acute Care | Admitting: Acute Care

## 2021-06-22 DIAGNOSIS — F17210 Nicotine dependence, cigarettes, uncomplicated: Secondary | ICD-10-CM | POA: Diagnosis not present

## 2021-06-22 DIAGNOSIS — Z87891 Personal history of nicotine dependence: Secondary | ICD-10-CM

## 2021-06-22 DIAGNOSIS — J432 Centrilobular emphysema: Secondary | ICD-10-CM | POA: Diagnosis not present

## 2021-06-22 DIAGNOSIS — I700 Atherosclerosis of aorta: Secondary | ICD-10-CM | POA: Diagnosis not present

## 2021-06-22 DIAGNOSIS — I2510 Atherosclerotic heart disease of native coronary artery without angina pectoris: Secondary | ICD-10-CM | POA: Diagnosis not present

## 2021-06-27 ENCOUNTER — Telehealth: Payer: Self-pay | Admitting: Acute Care

## 2021-06-27 DIAGNOSIS — F17210 Nicotine dependence, cigarettes, uncomplicated: Secondary | ICD-10-CM

## 2021-06-27 DIAGNOSIS — Z87891 Personal history of nicotine dependence: Secondary | ICD-10-CM

## 2021-06-27 NOTE — Telephone Encounter (Signed)
Pt informed of CT results per Sarah Groce, NP.  PT verbalized understanding.  Copy of CT sent to PCP.  Order placed for 1 yr f/u CT. ° °

## 2021-07-26 DIAGNOSIS — J410 Simple chronic bronchitis: Secondary | ICD-10-CM | POA: Diagnosis not present

## 2021-07-26 DIAGNOSIS — K219 Gastro-esophageal reflux disease without esophagitis: Secondary | ICD-10-CM | POA: Diagnosis not present

## 2021-07-26 DIAGNOSIS — J449 Chronic obstructive pulmonary disease, unspecified: Secondary | ICD-10-CM | POA: Diagnosis not present

## 2021-07-26 DIAGNOSIS — I10 Essential (primary) hypertension: Secondary | ICD-10-CM | POA: Diagnosis not present

## 2021-07-26 DIAGNOSIS — J441 Chronic obstructive pulmonary disease with (acute) exacerbation: Secondary | ICD-10-CM | POA: Diagnosis not present

## 2021-07-26 DIAGNOSIS — E782 Mixed hyperlipidemia: Secondary | ICD-10-CM | POA: Diagnosis not present

## 2021-08-04 DIAGNOSIS — J42 Unspecified chronic bronchitis: Secondary | ICD-10-CM | POA: Diagnosis not present

## 2021-08-31 DIAGNOSIS — J441 Chronic obstructive pulmonary disease with (acute) exacerbation: Secondary | ICD-10-CM | POA: Diagnosis not present

## 2021-08-31 DIAGNOSIS — E782 Mixed hyperlipidemia: Secondary | ICD-10-CM | POA: Diagnosis not present

## 2021-08-31 DIAGNOSIS — K219 Gastro-esophageal reflux disease without esophagitis: Secondary | ICD-10-CM | POA: Diagnosis not present

## 2021-08-31 DIAGNOSIS — I10 Essential (primary) hypertension: Secondary | ICD-10-CM | POA: Diagnosis not present

## 2021-10-19 DIAGNOSIS — M85852 Other specified disorders of bone density and structure, left thigh: Secondary | ICD-10-CM | POA: Diagnosis not present

## 2021-10-19 DIAGNOSIS — E782 Mixed hyperlipidemia: Secondary | ICD-10-CM | POA: Diagnosis not present

## 2021-10-19 DIAGNOSIS — E538 Deficiency of other specified B group vitamins: Secondary | ICD-10-CM | POA: Diagnosis not present

## 2021-10-23 DIAGNOSIS — J439 Emphysema, unspecified: Secondary | ICD-10-CM | POA: Diagnosis not present

## 2021-10-23 DIAGNOSIS — F17210 Nicotine dependence, cigarettes, uncomplicated: Secondary | ICD-10-CM | POA: Diagnosis not present

## 2021-10-23 DIAGNOSIS — I10 Essential (primary) hypertension: Secondary | ICD-10-CM | POA: Diagnosis not present

## 2021-10-23 DIAGNOSIS — I700 Atherosclerosis of aorta: Secondary | ICD-10-CM | POA: Diagnosis not present

## 2021-10-23 DIAGNOSIS — L219 Seborrheic dermatitis, unspecified: Secondary | ICD-10-CM | POA: Diagnosis not present

## 2021-10-23 DIAGNOSIS — R339 Retention of urine, unspecified: Secondary | ICD-10-CM | POA: Diagnosis not present

## 2021-10-23 DIAGNOSIS — K219 Gastro-esophageal reflux disease without esophagitis: Secondary | ICD-10-CM | POA: Diagnosis not present

## 2021-10-23 DIAGNOSIS — M85859 Other specified disorders of bone density and structure, unspecified thigh: Secondary | ICD-10-CM | POA: Diagnosis not present

## 2021-10-23 DIAGNOSIS — E782 Mixed hyperlipidemia: Secondary | ICD-10-CM | POA: Diagnosis not present

## 2021-10-23 DIAGNOSIS — M7542 Impingement syndrome of left shoulder: Secondary | ICD-10-CM | POA: Diagnosis not present

## 2021-10-23 DIAGNOSIS — K580 Irritable bowel syndrome with diarrhea: Secondary | ICD-10-CM | POA: Diagnosis not present

## 2021-11-13 DIAGNOSIS — I10 Essential (primary) hypertension: Secondary | ICD-10-CM | POA: Diagnosis not present

## 2021-11-13 DIAGNOSIS — K219 Gastro-esophageal reflux disease without esophagitis: Secondary | ICD-10-CM | POA: Diagnosis not present

## 2021-11-13 DIAGNOSIS — J441 Chronic obstructive pulmonary disease with (acute) exacerbation: Secondary | ICD-10-CM | POA: Diagnosis not present

## 2021-11-13 DIAGNOSIS — E782 Mixed hyperlipidemia: Secondary | ICD-10-CM | POA: Diagnosis not present

## 2021-11-21 ENCOUNTER — Other Ambulatory Visit: Payer: Self-pay

## 2021-11-21 ENCOUNTER — Ambulatory Visit: Payer: Medicare Other | Attending: Family Medicine | Admitting: Physical Therapy

## 2021-11-21 ENCOUNTER — Encounter: Payer: Self-pay | Admitting: Physical Therapy

## 2021-11-21 DIAGNOSIS — M25612 Stiffness of left shoulder, not elsewhere classified: Secondary | ICD-10-CM | POA: Diagnosis not present

## 2021-11-21 DIAGNOSIS — M25512 Pain in left shoulder: Secondary | ICD-10-CM | POA: Insufficient documentation

## 2021-11-21 NOTE — Therapy (Signed)
[facility] [address] ?[place], [place], [address] ?Phone: [phone]   Fax:  [phone] ? ?Physical Therapy Evaluation ? ?Patient Details  ?Name: Kristen Wilson ?MRN: [id number] ?Date of Birth: 1951/02/26 ?Referring Provider (PT): Addison Lank ? ? ?Encounter Date: 11/21/2021 ? ? PT End of Session - 11/21/21 1430   ? ? Visit Number 1   ? Date for PT Re-Evaluation 02/21/22   ? Authorization Type [employer] Medicare   ? PT Start Time 1351   ? PT Stop Time 5462   ? PT Time Calculation (min) 54 min   ? Activity Tolerance Patient tolerated treatment well   ? Behavior During Therapy [facility] for tasks assessed/performed   ? ?  ?  ? ?  ? ? ?Past Medical History:  ?Diagnosis Date  ? Allergy   ? Anginal pain ([place])   ? hx- non recent - r/t anxiety/panic attack  ? Anxiety   ? Arthritis   ? "Whole body"  ? Bronchitis   ? uses inhaler prn  ? Cough   ? smoker s cough  ? Depression   ? Diverticulitis   ? GERD (gastroesophageal reflux disease)   ? Hiatal hernia   ? Hyperlipidemia   ? Hypertension   ? IBS (irritable bowel syndrome)   ? Osteoporosis   ? Pneumonia   ? hx  ? Primary localized osteoarthritis of left knee   ? Shortness of breath   ? occ  ? Smoker   ? SVD (spontaneous vaginal delivery)   ? x 2  ? Upper respiratory tract infection 06/14/2015  ? Starting IV Rocephin today also starting nebulizers for wheezing  ? ? ?Past Surgical History:  ?Procedure Laterality Date  ? CHOLECYSTECTOMY    ? COLONOSCOPY N/A 03/20/2013  ? Procedure: COLONOSCOPY;  Surgeon: Wonda Horner, MD;  Location: [facility];  Service: Endoscopy;  Laterality: N/A;  ? CYST EXCISION PERINEAL  03/20/2018  ? Procedure: INCISION AND DRAINAGE OF PERINEAL CYST;  Surgeon: Brien Few, MD;  Location: [facility];  Service: Gynecology;;  ? DILATION AND CURETTAGE OF UTERUS    ? KNEE ARTHROSCOPY    ? left  ? TONSILLECTOMY    ? 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 Procedure: LEFT TOTAL KNEE ARTHROPLASTY;  Surgeon: Elsie Saas, MD;  Location: [facility];  Service: Orthopedics;  Laterality: Left;  ? TUBAL LIGATION    ? UPPER GI ENDOSCOPY    ? reflux, hiatal hernia  ? VULVECTOMY N/A 03/20/2018  ? Procedure: WIDE EXCISION VULVECTOMY;  Surgeon: Brien Few, MD;  Location: [facility];  Service: Gynecology;  Laterality: N/A;  ? WISDOM TOOTH EXTRACTION    ? ? ?There were no vitals filed for this visit. ? ? ? Subjective Assessment - 11/21/21 1356   ? ? Subjective Patient reports that she has had left upper arm pain for about 2 months, no known cause.  No imaging has been done. Pain with reaching up and out   ? Limitations Lifting;House hold activities   ? Patient Stated Goals have less pain, better ROM   ? Currently in Pain? Yes   ? Pain Score 3    ? Pain Location Shoulder   ? Pain Orientation Left;Upper;Lateral   ? Pain Descriptors / Indicators Aching;Nagging   ? Pain Type Acute pain   ? Pain Radiating Towards reports some numbness in the left hand   ? Pain Onset More than a month ago   ? Pain Frequency Constant   ?  Aggravating Factors  reaching up over head, using the left arm, at night worst pain up to 8/10   ? Pain Relieving Factors tylenol, voltaren gel, biofreeze pain down to a 2/10   ? Effect of Pain on Daily Activities difficulty with ADL's   ? ?  ?  ? ?  ? ? ? ? ? [facility] PT Assessment - 11/21/21 0001   ? ?  ? Assessment  ? Medical Diagnosis left RC syndrome   ? Referring Provider (PT) Addison Lank   ? Onset Date/Surgical Date 09/23/21   ? Hand Dominance Right   ? Prior Therapy no   ?  ? Precautions  ? Precautions None   ?  ? Balance Screen  ? Has the patient fallen in the past 6 months No   ? Has the patient had a decrease in activity level because of a fear of falling?  No   ? Is the patient reluctant to leave their home because of a fear of falling?  No   ?  ? Home Environment  ? Additional Comments does a little housework   ?  ? Prior Function  ? Level of Independence Independent   ? Vocation Part time employment   ? 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  ? Posture/Postural Control  ? Posture Comments fwd head, rounded shoulders   ?  ? ROM / Strength  ? AROM / PROM / Strength AROM;Strength;PROM   ?  ? AROM  ? AROM Assessment Site Shoulder   ? Right/Left Shoulder Left   ? Left Shoulder Flexion 125 Degrees   ? Left Shoulder ABduction 120 Degrees   ? Left Shoulder Internal Rotation 40 Degrees   ? Left Shoulder External Rotation 20 Degrees   ?  ? PROM  ? PROM Assessment Site Shoulder   ? Right/Left Shoulder Left   ? Left Shoulder Flexion 160 Degrees   ? Left Shoulder ABduction 160 Degrees   ? Left Shoulder Internal Rotation 45 Degrees   ? Left Shoulder External Rotation 25 Degrees   ?  ? Strength  ? Overall Strength Comments 3+/5 with pain   ?  ? Palpation  ? Palpation comment she is very tender and tight in the lateral left upper arm, tight upper trap, tight and tender in the posterior shoulder   ?  ? Special Tests  ? Other special tests + impingement, very painful empty can   ? ?  ?  ? ?  ? ? ? ? ? ? ? ? ? ? ? ? ? ?Objective measurements completed on examination: See above findings.  ? ? ? ? ? [facility] Adult PT Treatment/Exercise - 11/21/21 0001   ? ?  ? Modalities  ? Modalities Electrical Stimulation;Moist Heat   ?  ? Moist Heat Therapy  ? Number Minutes Moist Heat 10 Minutes   ? Moist Heat Location Shoulder   ?  ? Electrical Stimulation  ? Electrical Stimulation Location left lateral upper arm   ? Electrical Stimulation Action IFC   ? Electrical Stimulation Parameters supine   ? Electrical Stimulation Goals Pain   ? ?  ?  ? ?  ? ? ? ? ? ? ? ? ? ? ? ? PT Short Term Goals - 11/21/21 1433   ? ?  ? PT SHORT TERM GOAL #1  ? Title independent with initial HEP   ? Time 2   ? Period Weeks   ? Status New   ? ?  ?  ? ?  ? ? ? ?  PT Long Term Goals - 11/21/21 1433   ? ?  ? PT LONG TERM GOAL #1  ? Title Patient will be independent with advanced HEP   ? Time 12   ? Period Weeks   ? Status New   ?  ? PT LONG TERM GOAL #2  ? Title report no difficulty  with dressing and doing hair   ? Time 12   ? Period Weeks   ? Status New   ?  ? PT LONG TERM GOAL #3  ? Title increase AROM of ER to 60 degrees   ? Time 12   ? Period Weeks   ? Status New   ?  ? PT LONG TERM GOAL #4  ? Title increase left shoulder flexion to = the right   ? Time 12   ? Period Weeks   ? Status New   ? ?  ?  ? ?  ? ? ? ? ? ? ? ? ? Plan - 11/21/21 1430   ? ? Clinical Impression Statement Patient reports two months of left upper lateral arm pain, dx with RC impingement syndrome, she has poor posture, rounded shoulders, she does have a great deal of difficulty relaxing, tends to hold the arm out with the mms always active, she had positive impingement test, has decreased ROM most limited with ER and IR.  MMT caused pain.   ? Stability/Clinical Decision Making Stable/Uncomplicated   ? Clinical Decision Making Low   ? Rehab Potential Good   ? PT Frequency 2x / week   ? PT Duration 12 weeks   ? PT Treatment/Interventions ADLs/Self Care Home Management;Cryotherapy;Electrical Stimulation;Iontophoresis '4mg'$ /ml Dexamethasone;Moist Heat;Ultrasound;Therapeutic activities;Therapeutic exercise;Neuromuscular re-education;Manual techniques;Patient/family education;Passive range of motion;Dry needling;Vasopneumatic Device   ? PT Next Visit Plan work on posture and get her to relax   ? PT Home Exercise Plan Access Code: [email or web address]  URL: [email or web address]  Date: 11/21/2021  Prepared by: Lum Babe    Exercises  - Seated Scapular Retraction  - 2 x daily - 7 x weekly - 2 sets - 10 reps - 3 hold  - Seated Shoulder Shrugs  - 2 x daily - 7 x weekly - 2 sets - 10 reps - 3 hold   ? Consulted and Agree with Plan of Care Patient   ? ?  ?  ? ?  ? ? ?Patient will benefit from skilled therapeutic intervention in order to improve the following deficits and impairments:  Decreased range of motion, Decreased endurance, Increased muscle spasms, Impaired UE functional use, Pain, Improper body mechanics, Postural  dysfunction, Decreased strength ? ?Visit Diagnosis: ?Acute pain of left shoulder - Plan: PT plan of care cert/re-cert ? ?Stiffness of left shoulder, not elsewhere classified - Plan: PT plan of care cert/re-c

## 2021-11-21 NOTE — Patient Instructions (Signed)
Access Code: [email or web address] ?URL: [email or web address] ?Date: 11/21/2021 ?Prepared by: Lum Babe ? ?Exercises ?- Seated Scapular Retraction  - 2 x daily - 7 x weekly - 2 sets - 10 reps - 3 hold ?- Seated Shoulder Shrugs  - 2 x daily - 7 x weekly - 2 sets - 10 reps - 3 hold ?

## 2021-11-29 ENCOUNTER — Ambulatory Visit: Payer: Medicare Other | Admitting: Physical Therapy

## 2021-12-07 ENCOUNTER — Ambulatory Visit: Payer: Medicare Other | Admitting: Physical Therapy

## 2021-12-13 ENCOUNTER — Ambulatory Visit: Payer: Medicare Other | Attending: Family Medicine | Admitting: Physical Therapy

## 2021-12-13 ENCOUNTER — Encounter: Payer: Self-pay | Admitting: Physical Therapy

## 2021-12-13 DIAGNOSIS — R2681 Unsteadiness on feet: Secondary | ICD-10-CM | POA: Diagnosis not present

## 2021-12-13 DIAGNOSIS — R2689 Other abnormalities of gait and mobility: Secondary | ICD-10-CM | POA: Diagnosis not present

## 2021-12-13 DIAGNOSIS — M25612 Stiffness of left shoulder, not elsewhere classified: Secondary | ICD-10-CM | POA: Diagnosis not present

## 2021-12-13 DIAGNOSIS — M25512 Pain in left shoulder: Secondary | ICD-10-CM | POA: Diagnosis not present

## 2021-12-13 DIAGNOSIS — R42 Dizziness and giddiness: Secondary | ICD-10-CM | POA: Diagnosis not present

## 2021-12-13 NOTE — Therapy (Signed)
[facility] [address] ?[place], [place], [address] ?Phone: [phone]   Fax:  [phone] ? ?Physical Therapy Treatment ? ?Patient Details  ?Name: Kristen Wilson ?MRN: [id number] ?Date of Birth: 1951-06-02 ?Referring Provider (PT): Addison Lank ? ? ?Encounter Date: 12/13/2021 ? ? PT End of Session - 12/13/21 1600   ? ? Visit Number 2   ? Date for PT Re-Evaluation 02/21/22   ? Authorization Type UHC Medicare   ? PT Start Time 1500   ? PT Stop Time 3664   ? PT Time Calculation (min) 49 min   ? Activity Tolerance Patient tolerated treatment well   ? Behavior During Therapy [facility] for tasks assessed/performed   ? ?  ?  ? ?  ? ? ?Past Medical History:  ?Diagnosis Date  ? Allergy   ? Anginal pain ([place])   ? hx- non recent - r/t anxiety/panic attack  ? Anxiety   ? Arthritis   ? "Whole body"  ? Bronchitis   ? uses inhaler prn  ? Cough   ? smoker s cough  ? Depression   ? Diverticulitis   ? GERD (gastroesophageal reflux disease)   ? Hiatal hernia   ? Hyperlipidemia   ? Hypertension   ? IBS (irritable bowel syndrome)   ? Osteoporosis   ? Pneumonia   ? hx  ? Primary localized osteoarthritis of left knee   ? Shortness of breath   ? occ  ? Smoker   ? SVD (spontaneous vaginal delivery)   ? x 2  ? Upper respiratory tract infection 06/14/2015  ? Starting IV Rocephin today also starting nebulizers for wheezing  ? ? ?Past Surgical History:  ?Procedure Laterality Date  ? CHOLECYSTECTOMY    ? COLONOSCOPY N/A 03/20/2013  ? Procedure: COLONOSCOPY;  Surgeon: Wonda Horner, MD;  [facility];  Service: Endoscopy;  Laterality: N/A;  ? CYST EXCISION PERINEAL  03/20/2018  ? Procedure: INCISION AND DRAINAGE OF PERINEAL CYST;  Surgeon: Brien Few, MD;  Location: [facility];  Service: Gynecology;;  ? DILATION AND CURETTAGE OF UTERUS    ? KNEE ARTHROSCOPY    ? left  ? TONSILLECTOMY    ? 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 Procedure: LEFT TOTAL KNEE ARTHROPLASTY;  Surgeon: Elsie Saas, MD;  Location: [facility];  Service: Orthopedics;  Laterality: Left;  ? TUBAL LIGATION    ? UPPER GI ENDOSCOPY    ? reflux, hiatal hernia  ? VULVECTOMY N/A 03/20/2018  ? Procedure: WIDE EXCISION VULVECTOMY;  Surgeon: Brien Few, MD;  Location: [facility];  Service: Gynecology;  Laterality: N/A;  ? WISDOM TOOTH EXTRACTION    ? ? ?There were no vitals filed for this visit. ? ? Subjective Assessment - 12/13/21 1502   ? ? Subjective Patient reports R knee pain today. She is getting an injectoin for arthritis tomorrow. She is resistant to TKR.   ? Currently in Pain? Yes   ? Pain Score 4    ? Pain Location Shoulder   ? Pain Orientation Left   ? Pain Descriptors / Indicators Aching;Nagging   ? Pain Type Acute pain   ? Pain Onset More than a month ago   ? Pain Frequency Constant   ? ?  ?  ? ?  ? ? ? ? ? ? ? ? ? ? ? ? ? ? ? ? ? ? ? ? [facility] Adult PT Treatment/Exercise - 12/13/21 0001   ? ?  ?  Exercises  ? Exercises Shoulder   ?  ? Shoulder Exercises: Seated  ? Other Seated Exercises Place hands behind hips in ER, gently push through B hands while pushing top of head to ceiling. Deep breaths and try to relax shoulders down. 3 x 4 deep breaths.   ? Other Seated Exercises L shoulder depression, grasp L hand iwth R and gently pull down while deep breathing to relax L shoulder. 3 x 4 breaths   ?  ? Shoulder Exercises: Standing  ? Other Standing Exercises Pendulum exercise while holding her purse iwth deep breaths to relax into the stretch 3 x 4 breaths.   ?  ? Shoulder Exercises: ROM/Strengthening  ? UBE (Upper Arm Bike) L1, 2:30 for/2:30 back   ?  ? Shoulder Exercises: Stretch  ? Other Shoulder Stretches Doorway stretch for ant chest. 3 x 4 breaths at 90 degrees.   ?  ? Modalities  ? Modalities Iontophoresis   ?  ? Iontophoresis  ? Type of Iontophoresis Dexamethasone   ? Location L ant shoulder   ? Dose 1.29m   ? Time 4 hours   ?  ? Manual Therapy  ? Manual Therapy Soft tissue mobilization;Passive ROM;Joint mobilization   ?  Joint Mobilization L GH depression posterior glide 2 x 10 at grade 3   ? Soft tissue mobilization B traps, especially L Up traps and LS, L pects. Cross friction to biceps tendon   ? Passive ROM Scapular abd and depression, L   ? ?  ?  ? ?  ? ? ? ? ? ? ? ? ? ? PT Education - 12/13/21 1600   ? ? Education Details HEP, deep breathing to relax into stretches.   ? Person(s) Educated Patient   ? Methods Explanation;Demonstration;Handout   ? Comprehension Returned demonstration;Verbalized understanding   ? ?  ?  ? ?  ? ? ? PT Short Term Goals - 12/13/21 1605   ? ?  ? PT SHORT TERM GOAL #1  ? Title independent with initial HEP   ? Baseline Updated to include stretching.   ? Time 1   ? Period Weeks   ? Status On-going   ? ?  ?  ? ?  ? ? ? ? PT Long Term Goals - 11/21/21 1433   ? ?  ? PT LONG TERM GOAL #1  ? Title Patient will be independent with advanced HEP   ? Time 12   ? Period Weeks   ? Status New   ?  ? PT LONG TERM GOAL #2  ? Title report no difficulty with dressing and doing hair   ? Time 12   ? Period Weeks   ? Status New   ?  ? PT LONG TERM GOAL #3  ? Title increase AROM of ER to 60 degrees   ? Time 12   ? Period Weeks   ? Status New   ?  ? PT LONG TERM GOAL #4  ? Title increase left shoulder flexion to = the right   ? Time 12   ? Period Weeks   ? Status New   ? ?  ?  ? ?  ? ? ? ? ? ? ? ? Plan - 12/13/21 1600   ? ? Clinical Impression Statement Patient reports some relief from shoulder pain, her R knee pain has increased. she is scheduled for an injection in the knee. L shoulder tension remains high. Therpaist performed extensive STM on L, joint  mobs, followed by gentle self stretch, emphasizing humeral depression in the joint. She tolerated all exercises well, imporved relaxation achieved with repeated deep breaths.   ? Stability/Clinical Decision Making Stable/Uncomplicated   ? Clinical Decision Making Low   ? Rehab Potential Good   ? PT Frequency 2x / week   ? PT Duration Other (comment)   9w  ? PT  Treatment/Interventions ADLs/Self Care Home Management;Cryotherapy;Electrical Stimulation;Iontophoresis '4mg'$ /ml Dexamethasone;Moist Heat;Ultrasound;Therapeutic activities;Therapeutic exercise;Neuromuscular re-education;Manual techniques;Patient/family education;Passive range of motion;Dry needling;Vasopneumatic Device   ? PT Next Visit Plan Add postural strengthening exercises to HEP. assess how her program went.   ? PT Home Exercise Plan Access Code: [email or web address]   ? Consulted and Agree with Plan of Care Patient   ? ?  ?  ? ?  ? ? ?Patient will benefit from skilled therapeutic intervention in order to improve the following deficits and impairments:  Decreased range of motion, Decreased endurance, Increased muscle spasms, Impaired UE functional use, Pain, Improper body mechanics, Postural dysfunction, Decreased strength ? ?Visit Diagnosis: ?Acute pain of left shoulder ? ?Stiffness of left shoulder, not elsewhere classified ? ?Unsteadiness on feet ? ?Dizziness and giddiness ? ?Other abnormalities of gait and mobility ? ? ? ? ?Problem List ?Patient Active Problem List  ? Diagnosis Date Noted  ? Upper respiratory tract infection 06/14/2015  ? DJD (degenerative joint disease) of knee 06/13/2015  ? Primary localized osteoarthritis of left knee   ? Hiatal hernia   ? Arthritis   ? Allergy   ? Depression   ? GERD (gastroesophageal reflux disease)   ? Osteoporosis   ? Shortness of breath   ? IBS (irritable bowel syndrome)   ? Hypertension 11/02/2011  ? Chest pain 11/02/2011  ? Hyperlipidemia   ? ? ?Marcelina Morel, DPT ?12/13/2021, 4:06 PM ? ?[facility] ?[place] [address] ?[place], [place], [address] ?Phone: [phone]   Fax:  [phone] ? ?Name: Kristen Wilson ?MRN: [id number] ?Date of Birth: December 08, 1950 ? ? ? ?

## 2021-12-13 NOTE — Patient Instructions (Signed)
Access Code: [email or web address] ?URL: [email or web address] ?Date: 12/13/2021 ?Prepared by: Ethel Rana ? ?Exercises ?- Seated Scapular Retraction  - 2 x daily - 7 x weekly - 2 sets - 10 reps - 3 hold ?- Seated Shoulder Shrugs  - 2 x daily - 7 x weekly - 2 sets - 10 reps - 3 hold ?- Seated Shoulder Press Ups Off Table  - 1 x daily - 7 x weekly - 3 reps - 15 hold ?- Standing Shoulder Depression Stretch  - 1 x daily - 7 x weekly - 3 reps - 15 hold ?- Doorway Pec Stretch at 90 Degrees Abduction  - 1 x daily - 7 x weekly - 3 reps - 15 hold ?- Doorway Pec Stretch at 60 Elevation  - 1 x daily - 7 x weekly - 3 reps - 15 hold ?

## 2021-12-14 DIAGNOSIS — M1711 Unilateral primary osteoarthritis, right knee: Secondary | ICD-10-CM | POA: Diagnosis not present

## 2021-12-20 ENCOUNTER — Encounter: Payer: Self-pay | Admitting: Physical Therapy

## 2021-12-20 ENCOUNTER — Ambulatory Visit: Payer: Medicare Other | Admitting: Physical Therapy

## 2021-12-20 DIAGNOSIS — M25512 Pain in left shoulder: Secondary | ICD-10-CM | POA: Diagnosis not present

## 2021-12-20 DIAGNOSIS — R42 Dizziness and giddiness: Secondary | ICD-10-CM | POA: Diagnosis not present

## 2021-12-20 DIAGNOSIS — M25612 Stiffness of left shoulder, not elsewhere classified: Secondary | ICD-10-CM

## 2021-12-20 DIAGNOSIS — R2689 Other abnormalities of gait and mobility: Secondary | ICD-10-CM | POA: Diagnosis not present

## 2021-12-20 DIAGNOSIS — R2681 Unsteadiness on feet: Secondary | ICD-10-CM

## 2021-12-20 NOTE — Therapy (Signed)
[facility] ?[place] [address] ?[place], [place], [address] ?Phone: [phone]   Fax:  [phone] ? ?Physical Therapy Treatment ? ?Patient Details  ?Name: Kristen Wilson ?MRN: [id number] ?Date of Birth: May 07, 1951 ?Referring Provider (PT): Addison Lank ? ? ?Encounter Date: 12/20/2021 ? ? PT End of Session - 12/20/21 1434   ? ? Visit Number 3   ? Date for PT Re-Evaluation 02/21/22   ? Authorization Type UHC Medicare   ? PT Start Time 9509   ? PT Stop Time 3267   ? PT Time Calculation (min) 49 min   ? Activity Tolerance Patient tolerated treatment well   ? Behavior During Therapy [facility] for tasks assessed/performed   ? ?  ?  ? ?  ? ? ?Past Medical History:  ?Diagnosis Date  ? Allergy   ? Anginal pain ([place])   ? hx- non recent - r/t anxiety/panic attack  ? Anxiety   ? Arthritis   ? "Whole body"  ? Bronchitis   ? uses inhaler prn  ? Cough   ? smoker s cough  ? Depression   ? Diverticulitis   ? GERD (gastroesophageal reflux disease)   ? Hiatal hernia   ? Hyperlipidemia   ? Hypertension   ? IBS (irritable bowel syndrome)   ? Osteoporosis   ? Pneumonia   ? hx  ? Primary localized osteoarthritis of left knee   ? Shortness of breath   ? occ  ? Smoker   ? SVD (spontaneous vaginal delivery)   ? x 2  ? Upper respiratory tract infection 06/14/2015  ? Starting IV Rocephin today also starting nebulizers for wheezing  ? ? ?Past Surgical History:  ?Procedure Laterality Date  ? CHOLECYSTECTOMY    ? COLONOSCOPY N/A 03/20/2013  ? Procedure: COLONOSCOPY;  Surgeon: Wonda Horner, MD;  [facility];  Service: Endoscopy;  Laterality: N/A;  ? CYST EXCISION PERINEAL  03/20/2018  ? Procedure: INCISION AND DRAINAGE OF PERINEAL CYST;  Surgeon: Brien Few, MD;  Location: [facility];  Service: Gynecology;;  ? DILATION AND CURETTAGE OF UTERUS    ? KNEE ARTHROSCOPY    ? left  ? TONSILLECTOMY    ? 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 Procedure: LEFT TOTAL KNEE ARTHROPLASTY;  Surgeon: Elsie Saas, MD;  Location: [facility];  Service: Orthopedics;  Laterality: Left;  ? TUBAL LIGATION    ? UPPER GI ENDOSCOPY    ? reflux, hiatal hernia  ? VULVECTOMY N/A 03/20/2018  ? Procedure: WIDE EXCISION VULVECTOMY;  Surgeon: Brien Few, MD;  Location: [facility];  Service: Gynecology;  Laterality: N/A;  ? WISDOM TOOTH EXTRACTION    ? ? ?There were no vitals filed for this visit. ? ? Subjective Assessment - 12/20/21 1358   ? ? Subjective I got an injection in my knee last week and it feels better, shoulder feeling better overall still hurting and some pain with activity   ? Currently in Pain? Yes   ? Pain Score 3    ? Pain Location Shoulder   ? Pain Orientation Left   ? Pain Descriptors / Indicators Aching;Nagging   ? Aggravating Factors  turning over in bed really hurts   ? ?  ?  ? ?  ? ? ? ? ? [facility] PT Assessment - 12/20/21 0001   ? ?  ? AROM  ? Left Shoulder Flexion 135 Degrees   ? Left Shoulder ABduction 135 Degrees   ? Left Shoulder Internal  Rotation 60 Degrees   ? Left Shoulder External Rotation 40 Degrees   ? ?  ?  ? ?  ? ? ? ? ? ? ? ? ? ? ? ? ? ? ? ? [facility] Adult PT Treatment/Exercise - 12/20/21 0001   ? ?  ? Shoulder Exercises: Standing  ? Horizontal ABduction Both;20 reps;Theraband   ? Theraband Level (Shoulder Horizontal ABduction) Level 1 (Yellow)   ? Internal Rotation Left;20 reps;Theraband   ? Theraband Level (Shoulder Internal Rotation) Level 1 (Yellow)   ? Extension Left;20 reps;Theraband   ? Theraband Level (Shoulder Extension) Level 1 (Yellow)   ?  ? Shoulder Exercises: ROM/Strengthening  ? UBE (Upper Arm Bike) L2, 2:30 for/2:30 back   ? Cybex Row 1 plate;20 reps   ? Cybex Row Limitations a lot of cues needed to keep shoulders doubles   ? [place] 20   ?  ? Manual Therapy  ? Manual Therapy Soft tissue mobilization;Passive ROM;Joint mobilization   ? Soft tissue mobilization left biceps and deltoid area   ? Passive ROM left shoulder in supine all GH joint motions with some light contract relax   ? ?  ?  ? ?   ? ? ? ? ? ? ? ? ? ? ? ? PT Short Term Goals - 12/20/21 1436   ? ?  ? PT SHORT TERM GOAL #1  ? Title independent with initial HEP   ? Status Partially Met   ? ?  ?  ? ?  ? ? ? ? PT Long Term Goals - 12/20/21 1437   ? ?  ? PT LONG TERM GOAL #1  ? Title Patient will be independent with advanced HEP   ? Status On-going   ?  ? PT LONG TERM GOAL #2  ? Title report no difficulty with dressing and doing hair   ? Status On-going   ?  ? PT LONG TERM GOAL #3  ? Title increase AROM of ER to 60 degrees   ? Status On-going   ? ?  ?  ? ?  ? ? ? ? ? ? ? ? Plan - 12/20/21 1434   ? ? Clinical Impression Statement Patient really appears to be doing better, less pain overall and better motions, she still tends to be gaurded and needs cues to nott elevate shoulders and to relax at times but overall this is better, I did measure AROM today and it is improved from eval by at least 10 degrees for all motions.  2nd ionto applied today.  She has knots in the bicep and upper lateral arm that are very sore   ? PT Next Visit Plan continue to advance as tolerated   ? Consulted and Agree with Plan of Care Patient   ? ?  ?  ? ?  ? ? ?Patient will benefit from skilled therapeutic intervention in order to improve the following deficits and impairments:  Decreased range of motion, Decreased endurance, Increased muscle spasms, Impaired UE functional use, Pain, Improper body mechanics, Postural dysfunction, Decreased strength ? ?Visit Diagnosis: ?Acute pain of left shoulder ? ?Stiffness of left shoulder, not elsewhere classified ? ?Unsteadiness on feet ? ? ? ? ?Problem List ?Patient Active Problem List  ? Diagnosis Date Noted  ? Upper respiratory tract infection 06/14/2015  ? DJD (degenerative joint disease) of knee 06/13/2015  ? Primary localized osteoarthritis of left knee   ? Hiatal hernia   ? Arthritis   ? Allergy   ?  Depression   ? GERD (gastroesophageal reflux disease)   ? Osteoporosis   ? Shortness of breath   ? IBS (irritable bowel syndrome)    ? Hypertension 11/02/2011  ? Chest pain 11/02/2011  ? Hyperlipidemia   ? ? Sumner Boast, PT ?12/20/2021, 2:37 PM ? ?[facility] ?[address] ?[address] ?[place], [place], [address] ?Phone: [phone]   Fax:  [phone] ? ?Name: Kristen Wilson ?MRN: [id number] ?Date of Birth: 1951-05-05 ? ? ? ?

## 2021-12-27 ENCOUNTER — Ambulatory Visit: Payer: Medicare Other | Admitting: Physical Therapy

## 2022-01-02 DIAGNOSIS — M25571 Pain in right ankle and joints of right foot: Secondary | ICD-10-CM | POA: Diagnosis not present

## 2022-01-03 ENCOUNTER — Ambulatory Visit: Payer: Medicare Other | Attending: Family Medicine | Admitting: Physical Therapy

## 2022-01-03 ENCOUNTER — Encounter: Payer: Self-pay | Admitting: Physical Therapy

## 2022-01-03 DIAGNOSIS — M25612 Stiffness of left shoulder, not elsewhere classified: Secondary | ICD-10-CM | POA: Diagnosis not present

## 2022-01-03 DIAGNOSIS — R2689 Other abnormalities of gait and mobility: Secondary | ICD-10-CM | POA: Diagnosis not present

## 2022-01-03 DIAGNOSIS — R2681 Unsteadiness on feet: Secondary | ICD-10-CM | POA: Diagnosis not present

## 2022-01-03 DIAGNOSIS — R42 Dizziness and giddiness: Secondary | ICD-10-CM | POA: Insufficient documentation

## 2022-01-03 DIAGNOSIS — M25512 Pain in left shoulder: Secondary | ICD-10-CM | POA: Diagnosis not present

## 2022-01-03 NOTE — Therapy (Signed)
[facility] ?[place] ?[address] ?[place], [place], [address] ?Phone: [phone]   Fax:  [phone] ? ?Physical Therapy Treatment ? ?Patient Details  ?Name: Kristen Wilson ?MRN: [id number] ?Date of Birth: 1951/06/19 ?Referring Provider (PT): Addison Lank ? ? ?Encounter Date: 01/03/2022 ? ? PT End of Session - 01/03/22 1544   ? ? Visit Number 4   ? Date for PT Re-Evaluation 02/21/22   ? Authorization Type UHC Medicare   ? PT Start Time 1500   ? PT Stop Time 7591   ? PT Time Calculation (min) 44 min   ? Activity Tolerance Patient tolerated treatment well   ? Behavior During Therapy [facility] for tasks assessed/performed   ? ?  ?  ? ?  ? ? ?Past Medical History:  ?Diagnosis Date  ? Allergy   ? Anginal pain ([place])   ? hx- non recent - r/t anxiety/panic attack  ? Anxiety   ? Arthritis   ? "Whole body"  ? Bronchitis   ? uses inhaler prn  ? Cough   ? smoker s cough  ? Depression   ? Diverticulitis   ? GERD (gastroesophageal reflux disease)   ? Hiatal hernia   ? Hyperlipidemia   ? Hypertension   ? IBS (irritable bowel syndrome)   ? Osteoporosis   ? Pneumonia   ? hx  ? Primary localized osteoarthritis of left knee   ? Shortness of breath   ? occ  ? Smoker   ? SVD (spontaneous vaginal delivery)   ? x 2  ? Upper respiratory tract infection 06/14/2015  ? Starting IV Rocephin today also starting nebulizers for wheezing  ? ? ?Past Surgical History:  ?Procedure Laterality Date  ? CHOLECYSTECTOMY    ? COLONOSCOPY N/A 03/20/2013  ? Procedure: COLONOSCOPY;  Surgeon: Wonda Horner, MD;  [facility];  Service: Endoscopy;  Laterality: N/A;  ? CYST EXCISION PERINEAL  03/20/2018  ? Procedure: INCISION AND DRAINAGE OF PERINEAL CYST;  Surgeon: Brien Few, MD;  Location: [facility];  Service: Gynecology;;  ? DILATION AND CURETTAGE OF UTERUS    ? KNEE ARTHROSCOPY    ? left  ? TONSILLECTOMY    ? 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 Procedure: LEFT TOTAL KNEE ARTHROPLASTY;  Surgeon: Elsie Saas, MD;  Location: [facility];  Service: Orthopedics;  Laterality: Left;  ? TUBAL LIGATION    ? UPPER GI ENDOSCOPY    ? reflux, hiatal hernia  ? VULVECTOMY N/A 03/20/2018  ? Procedure: WIDE EXCISION VULVECTOMY;  Surgeon: Brien Few, MD;  Location: [facility];  Service: Gynecology;  Laterality: N/A;  ? WISDOM TOOTH EXTRACTION    ? ? ?There were no vitals filed for this visit. ? ? Subjective Assessment - 01/03/22 1458   ? ? Subjective patient reports her knee pain remains improved, but she has developed pain her R foot. She went to urgent care, who x rayed the ankle, but found no changes. They told her she may have some circulatory problems and recommended a consult with a vein Dr. Her L shoulder is much improved.   ? Limitations Lifting;House hold activities   ? Patient Stated Goals have less pain, better ROM   ? Currently in Pain? No/denies   ? Pain Onset More than a month ago   ? ?  ?  ? ?  ? ? ? ? ? ? ? ? ? ? ? ? ? ? ? ? ? ? ? ? [facility] Adult  PT Treatment/Exercise - 01/03/22 0001   ? ?  ? Shoulder Exercises: Seated  ? Other Seated Exercises Yellow Tband arouns wrists, pull wrists out into shoulder ER to shoulder width apart. Maintain elbow posiiton as you raise arms up to 90 degrees shoulder flexion and return, 1 x 15. 1 x 3 reps   ? Other Seated Exercises Place hands on mat behind hips, push shoulders down and lift chest to stretch, 3 x 15 seconds.   ?  ? Shoulder Exercises: Standing  ? Other Standing Exercises Bicep curls, 2 x 10 reps, 2#, 3 way arm raises x 10 with 1# weight B.   ?  ? Shoulder Exercises: ROM/Strengthening  ? Rebounder L1.5, 3 min forward/3 min back.   ?  ? Shoulder Exercises: Power Tower  ? Extension 10 reps   ? Extension Limitations 5#   ? Row 10 reps   ? Row Limitations 5#   ? Other Power Engineer, water Lat pulls 15#, 15   ? ?  ?  ? ?  ? ? ? ? ? ? ? ? ? ? ? ? PT Short Term Goals - 01/03/22 1504   ? ?  ? PT SHORT TERM GOAL #1  ? Title independent with initial HEP   ? Status Achieved   ? ?  ?  ? ?   ? ? ? ? PT Long Term Goals - 01/03/22 1525   ? ?  ? PT LONG TERM GOAL #1  ? Title Patient will be independent with advanced HEP   ? Status On-going   ?  ? PT LONG TERM GOAL #2  ? Title report no difficulty with dressing and doing hair   ? Status Achieved   ?  ? PT LONG TERM GOAL #3  ? Title increase AROM of ER to 60 degrees   ? Status On-going   ?  ? PT LONG TERM GOAL #4  ? Status Achieved   ? ?  ?  ? ?  ? ? ? ? ? ? ? ? Plan - 01/03/22 1544   ? ? Clinical Impression Statement Patient continues to progress well with L shoulder ROm nearing normal. She performed stregnthengin along iwht manual therapy to STM with no issues. Plan 1 more visit and then D/C if n issues.   ? Stability/Clinical Decision Making Stable/Uncomplicated   ? Clinical Decision Making Low   ? Rehab Potential Good   ? PT Frequency 2x / week   ? PT Treatment/Interventions ADLs/Self Care Home Management;Cryotherapy;Electrical Stimulation;Iontophoresis '4mg'$ /ml Dexamethasone;Moist Heat;Ultrasound;Therapeutic activities;Therapeutic exercise;Neuromuscular re-education;Manual techniques;Patient/family education;Passive range of motion;Dry needling;Vasopneumatic Device   ? PT Next Visit Plan continue to advance as tolerated   ? Consulted and Agree with Plan of Care Patient   ? ?  ?  ? ?  ? ? ?Patient will benefit from skilled therapeutic intervention in order to improve the following deficits and impairments:  Decreased range of motion, Decreased endurance, Increased muscle spasms, Impaired UE functional use, Pain, Improper body mechanics, Postural dysfunction, Decreased strength ? ?Visit Diagnosis: ?Acute pain of left shoulder ? ?Stiffness of left shoulder, not elsewhere classified ? ?Unsteadiness on feet ? ?Dizziness and giddiness ? ? ? ? ?Problem List ?Patient Active Problem List  ? Diagnosis Date Noted  ? Upper respiratory tract infection 06/14/2015  ? DJD (degenerative joint disease) of knee 06/13/2015  ? Primary localized osteoarthritis of left knee    ? Hiatal hernia   ? Arthritis   ? Allergy   ?  Depression   ? GERD (gastroesophageal reflux disease)   ? Osteoporosis   ? Shortness of breath   ? IBS (irritable bowel syndrome)   ? Hypertension 11/02/2011  ? Chest pain 11/02/2011  ? Hyperlipidemia   ? ? ?Marcelina Morel, DPT ?01/03/2022, 3:46 PM ? ?[facility] ?[address] ?[place], [place], [address] ?Phone: [phone]   Fax:  [phone] ? ?Name: Mavery Milling Navratil ?MRN: [id number] ?Date of Birth: 01/26/1951 ? ? ? ?

## 2022-01-05 DIAGNOSIS — I700 Atherosclerosis of aorta: Secondary | ICD-10-CM | POA: Diagnosis not present

## 2022-01-05 DIAGNOSIS — M79671 Pain in right foot: Secondary | ICD-10-CM | POA: Diagnosis not present

## 2022-01-05 DIAGNOSIS — M79672 Pain in left foot: Secondary | ICD-10-CM | POA: Diagnosis not present

## 2022-01-05 DIAGNOSIS — I10 Essential (primary) hypertension: Secondary | ICD-10-CM | POA: Diagnosis not present

## 2022-01-05 DIAGNOSIS — E782 Mixed hyperlipidemia: Secondary | ICD-10-CM | POA: Diagnosis not present

## 2022-01-05 DIAGNOSIS — F17210 Nicotine dependence, cigarettes, uncomplicated: Secondary | ICD-10-CM | POA: Diagnosis not present

## 2022-01-05 DIAGNOSIS — L539 Erythematous condition, unspecified: Secondary | ICD-10-CM | POA: Diagnosis not present

## 2022-01-08 NOTE — Progress Notes (Signed)
[facility]  ? ?ASSESSMENT / PLAN: ?Kristen Wilson is a 71 y.o. female with right ankle pain. Her clinical exam and non-invasive testing suggests no evidence of flow limiting peripheral arterial stenosis. She has mild chronic venous insufficiency. I do not think her ankle pain can be explained by vascular disease. Follow up with me as needed. ? ?CHIEF COMPLAINT: right ankle pain ? ?HISTORY OF PRESENT ILLNESS: ?Kristen Wilson is a 71 y.o. female referred to clinic for dependent rubor and right ankle pain in the setting of a strong personal history of smoking.  The patient points to her lateral malleolus and says that the ankle will wake her up from sleep at night.  The pain will subside after taking Celebrex and applying Voltaren gel.  She has a strong history of osteoarthritis. ? ?Past Medical History:  ?Diagnosis Date  ? Allergy   ? Anginal pain ([place])   ? hx- non recent - r/t anxiety/panic attack  ? Anxiety   ? Arthritis   ? "Whole body"  ? Bronchitis   ? uses inhaler prn  ? Cough   ? smoker s cough  ? Depression   ? Diverticulitis   ? GERD (gastroesophageal reflux disease)   ? Hiatal hernia   ? Hyperlipidemia   ? Hypertension   ? IBS (irritable bowel syndrome)   ? Osteoporosis   ? Pneumonia   ? hx  ? Primary localized osteoarthritis of left knee   ? Shortness of breath   ? occ  ? Smoker   ? SVD (spontaneous vaginal delivery)   ? x 2  ? Upper respiratory tract infection 06/14/2015  ? Starting IV Rocephin today also starting nebulizers for wheezing  ? ? ?Past Surgical History:  ?Procedure Laterality Date  ? CHOLECYSTECTOMY    ? COLONOSCOPY N/A 03/20/2013  ? Procedure: COLONOSCOPY;  Surgeon: Wonda Horner, MD;  Location: [facility];  Service: Endoscopy;  Laterality: N/A;  ? CYST EXCISION PERINEAL  03/20/2018  ? Procedure: INCISION AND DRAINAGE OF PERINEAL CYST;  Surgeon: Brien Few, MD;  Location: [facility];  Service: Gynecology;;  ? DILATION AND CURETTAGE OF UTERUS    ? KNEE  ARTHROSCOPY    ? left  ? TONSILLECTOMY    ? 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 Procedure: LEFT TOTAL KNEE ARTHROPLASTY;  Surgeon: Elsie Saas, MD;  Location: [facility];  Service: Orthopedics;  Laterality: Left;  ? TUBAL LIGATION    ? UPPER GI ENDOSCOPY    ? reflux, hiatal hernia  ? VULVECTOMY N/A 03/20/2018  ? Procedure: WIDE EXCISION VULVECTOMY;  Surgeon: Brien Few, MD;  Location: [facility];  Service: Gynecology;  Laterality: N/A;  ? WISDOM TOOTH EXTRACTION    ? ? ?Family History  ?Problem Relation Age of Onset  ? Cancer Mother   ?     breast  ? Arthritis Mother   ? Diabetes Mother   ? Cancer Father   ?     bladder,multiple myeloma  ? ? ?Social History  ? ?Socioeconomic History  ? Marital status: Widowed  ?  Spouse name: Not on file  ? Number of children: Not on file  ? Years of education: Not on file  ? Highest education level: Not on file  ?Occupational History  ? Not on file  ?Tobacco Use  ? Smoking status: Every Day  ?  Packs/day: 1.50  ?  Years: 40.00  ?  Pack years: 60.00  ?  Types: Cigarettes  ? Smokeless  tobacco: Never  ?Vaping Use  ? Vaping Use: Every day  ?Substance and Sexual Activity  ? Alcohol use: No  ? Drug use: No  ? Sexual activity: Never  ?  Birth control/protection: Post-menopausal  ?Other Topics Concern  ? Not on file  ?Social History Narrative  ? Not on file  ? ?Social Determinants of Health  ? ?Financial Resource Strain: Not on file  ?Food Insecurity: Not on file  ?Transportation Needs: Not on file  ?Physical Activity: Not on file  ?Stress: Not on file  ?Social Connections: Not on file  ?Intimate Partner Violence: Not on file  ? ? ?Allergies  ?Allergen Reactions  ? Latex Hives  ? Nsaids   ?  Aleve / Ibuprofen  - causes Blood Pressure to rise  ? Percocet [Oxycodone-Acetaminophen] Nausea And Vomiting  ? ? ?Current Outpatient Medications  ?Medication Sig Dispense Refill  ? albuterol (PROVENTIL HFA;VENTOLIN HFA) 108 (90 BASE) MCG/ACT inhaler Inhale 2 puffs into the lungs every 6 (six)  hours as needed for wheezing or shortness of breath. 1 Inhaler 2  ? Cholecalciferol (VITAMIN D3) 2000 units TABS Take 2,000 Units by mouth daily.    ? clonazePAM (KLONOPIN) 0.5 MG tablet Take 0.25 mg by mouth at bedtime.     ? docusate sodium (COLACE) 100 MG capsule 1 tab 2 times a day while on narcotics.  STOOL SOFTENER 60 capsule 0  ? DULoxetine (CYMBALTA) 30 MG capsule Take 90 mg by mouth at bedtime.     ? hydrochlorothiazide (HYDRODIURIL) 25 MG tablet Take 25 mg by mouth daily.     ? irbesartan (AVAPRO) 150 MG tablet Take 150 mg by mouth daily.  6  ? metoprolol (LOPRESSOR) 50 MG tablet Take 75 mg by mouth 2 (two) times daily.     ? Multiple Vitamin (MULTIVITAMIN) tablet Take 1 tablet by mouth daily.    ? neomycin-polymyxin b-dexamethasone (MAXITROL) 3.5-10000-0.1 OINT Place 1 application into both eyes at bedtime.  0  ? Omega-3 Fatty Acids (FISH OIL) 1000 MG CAPS Take 1,000 mg by mouth daily.    ? pantoprazole (PROTONIX) 40 MG tablet Take 40 mg by mouth daily.     ? polyethylene glycol (MIRALAX / GLYCOLAX) packet 17grams in 16 oz of water twice a day until bowel movement.  LAXITIVE.  Restart if two days since last bowel movement (Patient taking differently: 17grams in 16 oz of water twice a day until bowel movement.  LAXITIVE.  Restart if two days since last bowel movement) 60 each 0  ? simvastatin (ZOCOR) 20 MG tablet Take 20 mg by mouth at bedtime.     ? ?No current facility-administered medications for this visit.  ? ? ?PHYSICAL EXAM ?Vitals:  ? 01/09/22 1245  ?BP: (!) 149/83  ?Pulse: 67  ?Resp: 20  ?Temp: 99 ?F (37.2 ?C)  ?SpO2: 99%  ?Weight: 136 lb (61.7 kg)  ?Height: _0  (1.549 m)  ? ? ?Constitutional: well appearing woman in no distress. ?Cardiac: regular rate and rhythm.  ?Respiratory:  unlabored. ?Abdominal:  soft, non-tender, non-distended.  ?Peripheral vascular: 1+ PT pulse bilaterally ? ?PERTINENT LABORATORY AND RADIOLOGIC DATA ? ?Most recent CBC ? ?  Latest Ref Rng & Units 10/31/2020  ?  2:57 PM  09/01/2019  ?  6:43 PM 03/14/2018  ?  2:55 PM  ?CBC  ?WBC 4.0 - 10.5 K/uL 6.9   11.1   8.9    ?Hemoglobin 12.0 - 15.0 g/dL 15.8   16.1   15.9    ?  Hematocrit 36.0 - 46.0 % 46.1   48.0   46.3    ?Platelets 150 - 400 K/uL 164   168   176    ?  ? ?Most recent CMP ? ?  Latest Ref Rng & Units 10/31/2020  ?  2:57 PM 09/01/2019  ?  6:43 PM 03/14/2018  ?  2:55 PM  ?CMP  ?Glucose 70 - 99 mg/dL 97   103   103    ?BUN 8 - 23 mg/dL _0 ?Creatinine 0.44 - 1.00 mg/dL 0.79   0.82   0.76    ?Sodium 135 - 145 mmol/L 139   137   137    ?Potassium 3.5 - 5.1 mmol/L 3.5   3.3   3.6    ?Chloride 98 - 111 mmol/L 97   98   97    ?CO2 22 - 32 mmol/L 32   30   30    ?Calcium 8.9 - 10.3 mg/dL 9.2   8.9   9.1    ?Total Protein 6.5 - 8.1 g/dL 6.7      ?Total Bilirubin 0.3 - 1.2 mg/dL 0.9      ?Alkaline Phos 38 - 126 U/L 75      ?AST 15 - 41 U/L 32      ?ALT 0 - 44 U/L 28      ? ? ?+-------+-----------+-----------+------------+------------+  ?ABI/TBIToday's ABIToday's TBIPrevious ABIPrevious TBI  ?+-------+-----------+-----------+------------+------------+  ?Right  1.08       0.90                                 ?+-------+-----------+-----------+------------+------------+  ?Left   1.04       0.75                                 ?+-------+-----------+-----------+------------+------------+  ? ?   ? ?Yevonne Aline. Stanford Breed, MD ?[facility] [place] ?Office Phone Number: [phone] ?01/08/2022 7:48 PM ? ?Total time spent on preparing this encounter including chart review, data review, collecting history, examining the patient, coordinating care for this new patient, 45 minutes. ? ?Portions of this report may have been transcribed using voice recognition software.  Every effort has been made to ensure accuracy; however, inadvertent computerized transcription errors may still be present. ? ? ? ?

## 2022-01-09 ENCOUNTER — Ambulatory Visit (HOSPITAL_COMMUNITY)
Admission: RE | Admit: 2022-01-09 | Discharge: 2022-01-09 | Disposition: A | Discharge status: Home / Self Care | Payer: Medicare Other | Source: Ambulatory Visit | Attending: Vascular Surgery | Admitting: Vascular Surgery

## 2022-01-09 ENCOUNTER — Encounter: Payer: Self-pay | Admitting: Vascular Surgery

## 2022-01-09 ENCOUNTER — Ambulatory Visit: Payer: Medicare Other | Admitting: Vascular Surgery

## 2022-01-09 ENCOUNTER — Other Ambulatory Visit: Payer: Self-pay

## 2022-01-09 VITALS — BP 149/83 | HR 67 | Temp 99.0°F | Resp 20 | Ht 61.0 in | Wt 136.0 lb

## 2022-01-09 DIAGNOSIS — M25571 Pain in right ankle and joints of right foot: Secondary | ICD-10-CM | POA: Diagnosis not present

## 2022-01-09 DIAGNOSIS — I739 Peripheral vascular disease, unspecified: Secondary | ICD-10-CM | POA: Insufficient documentation

## 2022-01-24 ENCOUNTER — Ambulatory Visit: Payer: Medicare Other | Admitting: Physical Therapy

## 2022-01-24 ENCOUNTER — Encounter: Payer: Self-pay | Admitting: Physical Therapy

## 2022-01-24 DIAGNOSIS — R2681 Unsteadiness on feet: Secondary | ICD-10-CM | POA: Diagnosis not present

## 2022-01-24 DIAGNOSIS — M25612 Stiffness of left shoulder, not elsewhere classified: Secondary | ICD-10-CM | POA: Diagnosis not present

## 2022-01-24 DIAGNOSIS — R2689 Other abnormalities of gait and mobility: Secondary | ICD-10-CM

## 2022-01-24 DIAGNOSIS — M25512 Pain in left shoulder: Secondary | ICD-10-CM

## 2022-01-24 DIAGNOSIS — R42 Dizziness and giddiness: Secondary | ICD-10-CM | POA: Diagnosis not present

## 2022-01-24 NOTE — Patient Instructions (Signed)
Access Code: [email or web address] URL: [email or web address] Date: 01/24/2022 Prepared by: Ethel Rana  Exercises - Seated Scapular Retraction  - 2 x daily - 7 x weekly - 2 sets - 10 reps - 3 hold - Seated Shoulder Shrugs  - 2 x daily - 7 x weekly - 2 sets - 10 reps - 3 hold - Seated Shoulder Press Ups Off Table  - 1 x daily - 7 x weekly - 3 reps - 15 hold - Standing Shoulder Depression Stretch  - 1 x daily - 7 x weekly - 3 reps - 15 hold - Doorway Pec Stretch at 90 Degrees Abduction  - 1 x daily - 7 x weekly - 3 reps - 15 hold - Doorway Pec Stretch at 60 Elevation  - 1 x daily - 7 x weekly - 3 reps - 15 hold - Shoulder Extension with Resistance  - 1 x daily - 7 x weekly - 2 sets - 10 reps - Standing Row with Anchored Resistance  - 1 x daily - 7 x weekly - 2 sets - 10 reps - Shoulder External Rotation and Scapular Retraction with Resistance  - 1 x daily - 7 x weekly - 2 sets - 10 reps - Seated Bicep Curls Supinated with Dumbbells  - 1 x daily - 7 x weekly - 2 sets - 10 reps - Standing Bent Over Triceps Extension  - 1 x daily - 7 x weekly - 2 sets - 10 reps

## 2022-01-24 NOTE — Therapy (Signed)
[facility] [place], [place], [address] Phone: [phone]   Fax:  [phone]  Physical Therapy Treatment  Patient Details  Name: Kristen Wilson MRN: [id number] Date of Birth: 03/04/1951 Referring Provider (PT): Addison Lank   Encounter Date: 01/24/2022   PT End of Session - 01/24/22 1543     Visit Number 5    Date for PT Re-Evaluation 02/21/22    Authorization Type [employer] Medicare    PT Start Time 0347    PT Stop Time 1541    PT Time Calculation (min) 44 min    Activity Tolerance Patient tolerated treatment well    Behavior During Therapy WFL for tasks assessed/performed             Past Medical History:  Diagnosis Date   Allergy    Anginal pain ([place])    hx- non recent - r/t anxiety/panic attack   Anxiety    Arthritis    "Whole body"   Bronchitis    uses inhaler prn   Cough    smoker s cough   Depression    Diverticulitis    GERD (gastroesophageal reflux disease)    Hiatal hernia    Hyperlipidemia    Hypertension    IBS (irritable bowel syndrome)    Osteoporosis    Pneumonia    hx   Primary localized osteoarthritis of left knee    Shortness of breath    occ   Smoker    SVD (spontaneous vaginal delivery)    x 2   Upper respiratory tract infection 06/14/2015   Starting IV Rocephin today also starting nebulizers for wheezing    Past Surgical History:  Procedure Laterality Date   CHOLECYSTECTOMY     COLONOSCOPY N/A 03/20/2013   Procedure: COLONOSCOPY;  Surgeon: Wonda Horner, MD;  Location: [facility];  Service: Endoscopy;  Laterality: N/A;   CYST EXCISION PERINEAL  03/20/2018   Procedure: INCISION AND DRAINAGE OF PERINEAL CYST;  Surgeon: Brien Few, MD;  Location: [facility];  Service: Gynecology;;   DILATION AND CURETTAGE OF UTERUS     KNEE ARTHROSCOPY     left   TONSILLECTOMY     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   Subjective Assessment - 01/24/22 1459     Subjective Patient di dsee her Dr about her R ankle pain. all circulatory testing was Negative. She reports the pain is improved now. Her shoulder has been more painful this week. She has not been consistently been performing her exercises.    Limitations Lifting;House hold activities    Patient Stated Goals have less pain, better ROM    Currently in Pain? Yes    Pain Score 2     Pain Location Shoulder    Pain Orientation Left    Pain Descriptors / Indicators Aching    Pain Type Chronic pain    Pain Onset More than a month ago    Pain Frequency Intermittent  [facility] Adult PT Treatment/Exercise - 01/24/22 0001       Shoulder Exercises: Seated   Other Seated Exercises B elbow flexion with 2# weights. Bent over triceps extension with 2# weights, 10 reps each arm.    Other Seated Exercises Attempted shoulder abd to 90 and also lawn mowers, but both caused shoulder pain, so deferred.      Shoulder Exercises: Standing   External Rotation Strengthening;Both;10 reps;Theraband    Theraband Level (Shoulder External Rotation) Level 2 (Red)    Extension Strengthening;20 reps;Theraband    Theraband Level (Shoulder Extension) Level 2 (Red)    Row Strengthening;Both;20 reps;Theraband    Theraband Level (Shoulder Row) Level 2 (Red)      Shoulder Exercises: ROM/Strengthening   Rebounder L1.5, 3 min forward/3 min back.                       PT Short Term Goals - 01/03/22 1504       PT SHORT TERM GOAL #1   Title  independent with initial HEP    Status Achieved               PT Long Term Goals - 01/24/22 1544       PT LONG TERM GOAL #1   Title Patient will be independent with advanced HEP    Baseline Program updated and patient will assess tolerance.    Time 1    Period Weeks    Status On-going      PT LONG TERM GOAL #2   Title report no difficulty with dressing and doing hair    Status Achieved      PT LONG TERM GOAL #3   Title increase AROM of ER to 60 degrees    Status On-going      PT LONG TERM GOAL #4   Status Achieved                   Plan - 01/24/22 1503     Clinical Impression Statement Patient's HEP updated to include additional strengtehning for posture. Plan is to have her practice, re-assess and d/C upon next visit.    Stability/Clinical Decision Making Stable/Uncomplicated    Clinical Decision Making Low    Rehab Potential Good    PT Frequency 2x / week    PT Treatment/Interventions ADLs/Self Care Home Management;Cryotherapy;Electrical Stimulation;Iontophoresis '4mg'$ /ml Dexamethasone;Moist Heat;Ultrasound;Therapeutic activities;Therapeutic exercise;Neuromuscular re-education;Manual techniques;Patient/family education;Passive range of motion;Dry needling;Vasopneumatic Device    PT Next Visit Plan continue to advance as tolerated    PT Home Exercise Plan Access Code: [email or web address]    Consulted and Agree with Plan of Care Patient             Patient will benefit from skilled therapeutic intervention in order to improve the following deficits and impairments:  Decreased range of motion, Decreased endurance, Increased muscle spasms, Impaired UE functional use, Pain, Improper body mechanics, Postural dysfunction, Decreased strength  Visit Diagnosis: Acute pain of left shoulder  Stiffness of left shoulder, not elsewhere classified  Unsteadiness on feet  Dizziness and giddiness  Other abnormalities of gait and mobility     Problem List Patient Active  Problem List   Diagnosis Date Noted   Upper respiratory tract infection 06/14/2015   DJD (degenerative joint disease) of knee 06/13/2015   Primary localized osteoarthritis of left knee    Hiatal hernia    Arthritis    Allergy    Depression    GERD (gastroesophageal reflux disease)  Osteoporosis    Shortness of breath    IBS (irritable bowel syndrome)    Hypertension 11/02/2011   Chest pain 11/02/2011   Hyperlipidemia     Marcelina Morel, DPT 01/24/2022, 3:45 PM  [facility]. [place], [place], [address] Phone: [phone]   Fax:  [phone]  Name: Kristen Wilson MRN: [id number] Date of Birth: 09-02-1950

## 2022-01-31 ENCOUNTER — Ambulatory Visit: Payer: Medicare Other | Admitting: Physical Therapy

## 2022-02-03 DIAGNOSIS — J42 Unspecified chronic bronchitis: Secondary | ICD-10-CM | POA: Diagnosis not present

## 2022-02-13 ENCOUNTER — Encounter (HOSPITAL_COMMUNITY): Payer: Self-pay

## 2022-02-13 ENCOUNTER — Emergency Department (HOSPITAL_COMMUNITY): Payer: Medicare Other

## 2022-02-13 ENCOUNTER — Observation Stay (HOSPITAL_COMMUNITY)
Admission: EM | Admit: 2022-02-13 | Discharge: 2022-02-14 | Disposition: A | Discharge status: Home / Self Care | Payer: Medicare Other | Attending: Internal Medicine | Admitting: Internal Medicine

## 2022-02-13 DIAGNOSIS — Z91040 Latex allergy status: Secondary | ICD-10-CM | POA: Diagnosis not present

## 2022-02-13 DIAGNOSIS — F17210 Nicotine dependence, cigarettes, uncomplicated: Secondary | ICD-10-CM | POA: Diagnosis not present

## 2022-02-13 DIAGNOSIS — I499 Cardiac arrhythmia, unspecified: Secondary | ICD-10-CM | POA: Diagnosis not present

## 2022-02-13 DIAGNOSIS — Z743 Need for continuous supervision: Secondary | ICD-10-CM | POA: Diagnosis not present

## 2022-02-13 DIAGNOSIS — J432 Centrilobular emphysema: Secondary | ICD-10-CM | POA: Diagnosis not present

## 2022-02-13 DIAGNOSIS — Z96652 Presence of left artificial knee joint: Secondary | ICD-10-CM | POA: Insufficient documentation

## 2022-02-13 DIAGNOSIS — I10 Essential (primary) hypertension: Secondary | ICD-10-CM | POA: Diagnosis not present

## 2022-02-13 DIAGNOSIS — E876 Hypokalemia: Secondary | ICD-10-CM | POA: Diagnosis not present

## 2022-02-13 DIAGNOSIS — I2510 Atherosclerotic heart disease of native coronary artery without angina pectoris: Secondary | ICD-10-CM | POA: Diagnosis not present

## 2022-02-13 DIAGNOSIS — Z7982 Long term (current) use of aspirin: Secondary | ICD-10-CM | POA: Insufficient documentation

## 2022-02-13 DIAGNOSIS — J439 Emphysema, unspecified: Secondary | ICD-10-CM | POA: Diagnosis not present

## 2022-02-13 DIAGNOSIS — I2584 Coronary atherosclerosis due to calcified coronary lesion: Secondary | ICD-10-CM | POA: Insufficient documentation

## 2022-02-13 DIAGNOSIS — Z79899 Other long term (current) drug therapy: Secondary | ICD-10-CM | POA: Insufficient documentation

## 2022-02-13 DIAGNOSIS — E7800 Pure hypercholesterolemia, unspecified: Secondary | ICD-10-CM | POA: Insufficient documentation

## 2022-02-13 DIAGNOSIS — I214 Non-ST elevation (NSTEMI) myocardial infarction: Secondary | ICD-10-CM | POA: Diagnosis not present

## 2022-02-13 DIAGNOSIS — R6889 Other general symptoms and signs: Secondary | ICD-10-CM | POA: Diagnosis not present

## 2022-02-13 DIAGNOSIS — Z9049 Acquired absence of other specified parts of digestive tract: Secondary | ICD-10-CM | POA: Diagnosis not present

## 2022-02-13 DIAGNOSIS — I4891 Unspecified atrial fibrillation: Secondary | ICD-10-CM | POA: Diagnosis not present

## 2022-02-13 DIAGNOSIS — I480 Paroxysmal atrial fibrillation: Secondary | ICD-10-CM | POA: Diagnosis present

## 2022-02-13 DIAGNOSIS — R000 Tachycardia, unspecified: Secondary | ICD-10-CM | POA: Diagnosis not present

## 2022-02-13 DIAGNOSIS — F17200 Nicotine dependence, unspecified, uncomplicated: Secondary | ICD-10-CM

## 2022-02-13 DIAGNOSIS — R1110 Vomiting, unspecified: Secondary | ICD-10-CM | POA: Diagnosis not present

## 2022-02-13 DIAGNOSIS — R778 Other specified abnormalities of plasma proteins: Secondary | ICD-10-CM

## 2022-02-13 LAB — TROPONIN I (HIGH SENSITIVITY)
Troponin I (High Sensitivity): 12 ng/L (ref <18)
Troponin I (High Sensitivity): 114 ng/L (ref <18)
Troponin I (High Sensitivity): 362 ng/L (ref <18)
Troponin I (High Sensitivity): 492 ng/L (ref <18)

## 2022-02-13 LAB — URINALYSIS, ROUTINE W REFLEX MICROSCOPIC
Bilirubin Urine: NEGATIVE
Glucose, UA: NEGATIVE mg/dL
Hgb urine dipstick: NEGATIVE
Ketones, ur: NEGATIVE mg/dL
Leukocytes,Ua: NEGATIVE
Nitrite: NEGATIVE
Protein, ur: NEGATIVE mg/dL
Specific Gravity, Urine: 1.025 (ref 1.005–1.030)
pH: 6 (ref 5.0–8.0)

## 2022-02-13 LAB — CBC
HCT: 44.9 % (ref 36.0–46.0)
Hemoglobin: 15.6 g/dL — ABNORMAL HIGH (ref 12.0–15.0)
MCH: 32 pg (ref 26.0–34.0)
MCHC: 34.7 g/dL (ref 30.0–36.0)
MCV: 92 fL (ref 80.0–100.0)
Platelets: 205 10*3/uL (ref 150–400)
RBC: 4.88 MIL/uL (ref 3.87–5.11)
RDW: 13 % (ref 11.5–15.5)
WBC: 19.4 10*3/uL — ABNORMAL HIGH (ref 4.0–10.5)
nRBC: 0 % (ref 0.0–0.2)

## 2022-02-13 LAB — BASIC METABOLIC PANEL
Anion gap: 9 (ref 5–15)
BUN: 14 mg/dL (ref 8–23)
CO2: 28 mmol/L (ref 22–32)
Calcium: 8 mg/dL — ABNORMAL LOW (ref 8.9–10.3)
Chloride: 97 mmol/L — ABNORMAL LOW (ref 98–111)
Creatinine, Ser: 1.01 mg/dL — ABNORMAL HIGH (ref 0.44–1.00)
GFR, Estimated: 60 mL/min — ABNORMAL LOW (ref >60)
Glucose, Bld: 156 mg/dL — ABNORMAL HIGH (ref 70–99)
Potassium: 3.2 mmol/L — ABNORMAL LOW (ref 3.5–5.1)
Sodium: 134 mmol/L — ABNORMAL LOW (ref 135–145)

## 2022-02-13 LAB — TSH: TSH: 0.781 u[IU]/mL (ref 0.350–4.500)

## 2022-02-13 LAB — MAGNESIUM: Magnesium: 1.5 mg/dL — ABNORMAL LOW (ref 1.7–2.4)

## 2022-02-13 MED ORDER — DULOXETINE HCL 60 MG PO CPEP
120.0000 mg | ORAL_CAPSULE | Freq: Two times a day (BID) | ORAL | Status: DC
  Administered 2022-02-13: 120 mg via ORAL
  Filled 2022-02-13: qty 2

## 2022-02-13 MED ORDER — OXYCODONE HCL 5 MG PO TABS: 5.0000 mg | ORAL_TABLET | Freq: Four times a day (QID) | ORAL | Status: DC | PRN

## 2022-02-13 MED ORDER — MAGNESIUM SULFATE 2 GM/50ML IV SOLN
2.0000 g | Freq: Once | INTRAVENOUS | Status: AC
Start: 2022-02-13 — End: 2022-02-13
  Administered 2022-02-13: 2 g via INTRAVENOUS
  Filled 2022-02-13: qty 50

## 2022-02-13 MED ORDER — HEPARIN (PORCINE) 25000 UT/250ML-% IV SOLN
900.0000 [IU]/h | INTRAVENOUS | Status: DC
  Administered 2022-02-13: 900 [IU]/h via INTRAVENOUS
  Filled 2022-02-13: qty 250

## 2022-02-13 MED ORDER — VITAMIN B-12 1000 MCG PO TABS: 500.0000 ug | ORAL_TABLET | Freq: Every day | ORAL | Status: DC

## 2022-02-13 MED ORDER — IOHEXOL 350 MG/ML SOLN
100.0000 mL | Freq: Once | INTRAVENOUS | Status: AC | PRN
Start: 2022-02-13 — End: 2022-02-13
  Administered 2022-02-13: 100 mL via INTRAVENOUS

## 2022-02-13 MED ORDER — ALPRAZOLAM 0.25 MG PO TABS
0.5000 mg | ORAL_TABLET | Freq: Every day | ORAL | Status: DC | PRN
  Administered 2022-02-14: 0.5 mg via ORAL
  Filled 2022-02-13: qty 2

## 2022-02-13 MED ORDER — PANTOPRAZOLE SODIUM 40 MG PO TBEC: 40.0000 mg | DELAYED_RELEASE_TABLET | Freq: Every day | ORAL | Status: DC

## 2022-02-13 MED ORDER — ROSUVASTATIN CALCIUM 20 MG PO TABS
20.0000 mg | ORAL_TABLET | Freq: Every day | ORAL | Status: DC
  Administered 2022-02-13: 20 mg via ORAL
  Filled 2022-02-13: qty 1

## 2022-02-13 MED ORDER — POTASSIUM CHLORIDE CRYS ER 20 MEQ PO TBCR
40.0000 meq | EXTENDED_RELEASE_TABLET | Freq: Once | ORAL | Status: AC
  Administered 2022-02-13: 40 meq via ORAL
  Filled 2022-02-13: qty 2

## 2022-02-13 MED ORDER — ASPIRIN 81 MG PO CHEW: 81.0000 mg | CHEWABLE_TABLET | Freq: Every day | ORAL | Status: DC

## 2022-02-13 MED ORDER — MELATONIN 5 MG PO TABS
5.0000 mg | ORAL_TABLET | Freq: Every evening | ORAL | Status: DC | PRN
Start: 2022-02-13 — End: 2022-02-14

## 2022-02-13 MED ORDER — ACETAMINOPHEN 325 MG PO TABS: 650.0000 mg | ORAL_TABLET | Freq: Four times a day (QID) | ORAL | Status: DC | PRN

## 2022-02-13 MED ORDER — PROCHLORPERAZINE EDISYLATE 10 MG/2ML IJ SOLN
10.0000 mg | Freq: Four times a day (QID) | INTRAMUSCULAR | Status: DC | PRN
Start: 2022-02-13 — End: 2022-02-14

## 2022-02-13 MED ORDER — DILTIAZEM HCL-DEXTROSE 125-5 MG/125ML-% IV SOLN (PREMIX)
5.0000 mg/h | INTRAVENOUS | Status: DC
  Administered 2022-02-13: 5 mg/h via INTRAVENOUS
  Filled 2022-02-13: qty 125

## 2022-02-13 MED ORDER — HEPARIN BOLUS VIA INFUSION
3500.0000 [IU] | Freq: Once | INTRAVENOUS | Status: AC
  Administered 2022-02-13: 3500 [IU] via INTRAVENOUS
  Filled 2022-02-13: qty 3500

## 2022-02-13 MED ORDER — DILTIAZEM HCL 25 MG/5ML IV SOLN
10.0000 mg | Freq: Once | INTRAVENOUS | Status: AC
  Administered 2022-02-13: 10 mg via INTRAVENOUS

## 2022-02-13 MED ORDER — POLYETHYLENE GLYCOL 3350 17 G PO PACK
17.0000 g | PACK | Freq: Every day | ORAL | Status: DC | PRN
  Administered 2022-02-14: 17 g via ORAL
  Filled 2022-02-13: qty 1

## 2022-02-13 MED ORDER — LACTATED RINGERS IV SOLN: INTRAVENOUS | Status: DC

## 2022-02-13 NOTE — ED Provider Notes (Addendum)
[facility] EMERGENCY DEPARTMENT Provider Note   CSN: [id number] Arrival date & time: 02/13/22  1607     History  Chief Complaint  Patient presents with   Afib RVR    Kristen Wilson is a 71 y.o. female.  HPI Patient presents for palpitations.  Medical history includes HLD, HTN, depression, GERD, IBS, anxiety.  She has no history of atrial fibrillation but has had episodes of tachycardia in the past and has been prescribed metoprolol, which she takes twice a day.  Earlier today, patient was at home, at rest, when she developed palpitations, left-sided chest tightness, and "wooziness".  EMS was called.  EMS noted a irregularly irregular tachycardia with heart rate ranging from 150s to 190s on arrival.  She remained normotensive.  She was given 300 cc of IVF and 10 mg of Cardizem prior to arrival.  She had improvement in her heart rate in the range of 90s to 100s.  She is currently finishing a course of steroids and doxycycline for treatment of bronchitis.  She otherwise denies any recent infectious symptoms.  Following normalization of heart rate in the ambulance, patient reported improvement in symptoms.  On arrival, patient has recurrence of tachycardia and does endorse recurrence of left-sided chest tightness and wooziness.      Home Medications Prior to Admission medications   Medication Sig Start Date End Date Taking? Authorizing Provider  albuterol (PROVENTIL HFA;VENTOLIN HFA) 108 (90 BASE) MCG/ACT inhaler Inhale 2 puffs into the lungs every 6 (six) hours as needed for wheezing or shortness of breath. 06/15/15   Shepperson, Kirstin, PA-C  ALPRAZolam (XANAX) 0.5 MG tablet Take 0.5 mg by mouth 3 (three) times daily as needed. 12/12/21   [provider]  aspirin 81 MG chewable tablet 1 tablet 10/06/13   [provider]  Cholecalciferol (VITAMIN D3) 2000 units TABS Take 2,000 Units by mouth daily.    [provider]  clonazePAM (KLONOPIN) 0.5  MG tablet Take 0.25 mg by mouth at bedtime.     [provider]  docusate sodium (COLACE) 100 MG capsule 1 tab 2 times a day while on narcotics.  STOOL SOFTENER 06/15/15   Shepperson, Kirstin, PA-C  DULoxetine (CYMBALTA) 30 MG capsule Take 90 mg by mouth at bedtime.     [provider]  DULoxetine (CYMBALTA) 60 MG capsule Take 120 mg by mouth daily. 01/08/22   [provider]  hydrochlorothiazide (HYDRODIURIL) 25 MG tablet Take 25 mg by mouth daily.     [provider]  irbesartan (AVAPRO) 300 MG tablet Take 300 mg by mouth daily. 11/18/21   [provider]  meloxicam (MOBIC) 15 MG tablet Take 15 mg by mouth daily. 01/02/22   [provider]  metoprolol (LOPRESSOR) 50 MG tablet Take 75 mg by mouth 2 (two) times daily.     [provider]  Multiple Vitamin (MULTIVITAMIN) tablet Take 1 tablet by mouth daily.    [provider]  neomycin-polymyxin b-dexamethasone (MAXITROL) 3.5-10000-0.1 OINT Place 1 application into both eyes at bedtime. 12/25/17   [provider]  Omega-3 Fatty Acids (FISH OIL) 1000 MG CAPS Take 1,000 mg by mouth daily.    [provider]  pantoprazole (PROTONIX) 40 MG tablet Take 40 mg by mouth daily.     [provider]  polyethylene glycol (MIRALAX / GLYCOLAX) packet 17grams in 16 oz of water twice a day until bowel movement.  LAXITIVE.  Restart if two days since last bowel movement Patient  taking differently: 17grams in 16 oz of water twice a day until bowel movement.  LAXITIVE.  Restart if two days since last bowel movement 06/15/15   Shepperson, Kirstin, PA-C  rosuvastatin (CRESTOR) 20 MG tablet Take 20 mg by mouth daily. 10/23/21   [provider]      Allergies    Latex, Nsaids, and Percocet [oxycodone-acetaminophen]    Review of Systems   Review of Systems  Cardiovascular:  Positive for chest pain (Described as a tightness) and palpitations.  Neurological:  Positive for  light-headedness.  All other systems reviewed and are negative.   Physical Exam Updated Vital Signs BP 98/68   Pulse 61   Temp 98.3 F (36.8 C) (Oral)   Resp 20   Ht '5\' 1"'$  (1.549 m)   Wt 61.2 kg   SpO2 98%   BMI 25.51 kg/m  Physical Exam Vitals and nursing note reviewed.  Constitutional:      General: She is not in acute distress.    Appearance: Normal appearance. She is well-developed and normal weight. She is not ill-appearing, toxic-appearing or diaphoretic.  HENT:     Head: Normocephalic and atraumatic.     Right Ear: External ear normal.     Left Ear: External ear normal.     Nose: Nose normal.     Mouth/Throat:     Mouth: Mucous membranes are moist.     Pharynx: Oropharynx is clear.  Eyes:     Extraocular Movements: Extraocular movements intact.     Conjunctiva/sclera: Conjunctivae normal.  Cardiovascular:     Rate and Rhythm: Tachycardia present. Rhythm irregular.     Heart sounds: No murmur heard. Pulmonary:     Effort: Pulmonary effort is normal. No respiratory distress.     Breath sounds: Normal breath sounds. No wheezing or rales.  Chest:     Chest wall: No tenderness.  Abdominal:     Palpations: Abdomen is soft.     Tenderness: There is no abdominal tenderness.  Musculoskeletal:        General: No swelling. Normal range of motion.     Cervical back: Normal range of motion and neck supple.     Right lower leg: No edema.     Left lower leg: No edema.  Skin:    General: Skin is warm and dry.     Coloration: Skin is not jaundiced or pale.  Neurological:     General: No focal deficit present.     Mental Status: She is alert and oriented to person, place, and time.     Cranial Nerves: No cranial nerve deficit.     Sensory: No sensory deficit.     Motor: No weakness.     Coordination: Coordination normal.  Psychiatric:        Mood and Affect: Mood normal.        Behavior: Behavior normal.        Thought Content: Thought content normal.         Judgment: Judgment normal.     ED Results / Procedures / Treatments   Labs (all labs ordered are listed, but only abnormal results are displayed) Labs Reviewed  BASIC METABOLIC PANEL - Abnormal; Notable for the following components:      Result Value   Sodium 134 (*)    Potassium 3.2 (*)    Chloride 97 (*)    Glucose, Bld 156 (*)    Creatinine, Ser 1.01 (*)    Calcium 8.0 (*)  GFR, Estimated 60 (*)    All other components within normal limits  MAGNESIUM - Abnormal; Notable for the following components:   Magnesium 1.5 (*)    All other components within normal limits  CBC - Abnormal; Notable for the following components:   WBC 19.4 (*)    Hemoglobin 15.6 (*)    All other components within normal limits  URINALYSIS, ROUTINE W REFLEX MICROSCOPIC - Abnormal; Notable for the following components:   Color, Urine AMBER (*)    All other components within normal limits  TROPONIN I (HIGH SENSITIVITY) - Abnormal; Notable for the following components:   Troponin I (High Sensitivity) 114 (*)    All other components within normal limits  TSH  HEPARIN LEVEL (UNFRACTIONATED)  CBC  HEMOGLOBIN A1C  CBC WITH DIFFERENTIAL/PLATELET  COMPREHENSIVE METABOLIC PANEL  MAGNESIUM  PHOSPHORUS  POC OCCULT BLOOD, ED  TROPONIN I (HIGH SENSITIVITY)  TROPONIN I (HIGH SENSITIVITY)    EKG None  Radiology CT Angio Chest PE W and/or Wo Contrast  Result Date: 02/13/2022 CLINICAL DATA:  Pulmonary embolism (PE) suspected, high prob. palpitations. EMS reports HR was 190s with afib RVR on the monitor. Cardizem '10mg'$  IVP and 350cc NS. Alert and oriented x 4. EXAM: CT ANGIOGRAPHY CHEST WITH CONTRAST TECHNIQUE: Multidetector CT imaging of the chest was performed using the standard protocol during bolus administration of intravenous contrast. Multiplanar CT image reconstructions and MIPs were obtained to evaluate the vascular anatomy. RADIATION DOSE REDUCTION: This exam was performed according to the  departmental dose-optimization program which includes automated exposure control, adjustment of the mA and/or kV according to patient size and/or use of iterative reconstruction technique. CONTRAST:  167m OMNIPAQUE IOHEXOL 350 MG/ML SOLN COMPARISON:  None Available. FINDINGS: Cardiovascular: Satisfactory opacification of the pulmonary arteries to the segmental level. No evidence of pulmonary embolism. The main pulmonary artery is normal in caliber. Normal heart size. No significant pericardial effusion. The thoracic aorta is normal in caliber. No atherosclerotic plaque of the thoracic aorta. At least 2 vessel coronary artery calcifications. Mediastinum/Nodes: No enlarged mediastinal, hilar, or axillary lymph nodes. Thyroid gland, trachea, and esophagus demonstrate no significant findings. Lungs/Pleura: Mild centrilobular emphysematous changes. No focal liver abnormality. No gallstones, gallbladder wall thickening, or pericholecystic fluid. No biliary dilatation. Upper Abdomen: Status post cholecystectomy.  No acute abnormality. Musculoskeletal: No chest wall abnormality. No suspicious lytic or blastic osseous lesions. No acute displaced fracture. Multilevel degenerative changes of the spine. Review of the MIP images confirms the above findings. IMPRESSION: 1. No pulmonary embolus. 2. No acute intrathoracic abnormality. 3. Aortic Atherosclerosis (ICD10-I70.0) and Emphysema (ICD10-J43.9). Two vessel coronary calcification. Electronically Signed   By: MIven FinnM.D.   On: 02/13/2022 18:55   DG Chest Port 1 View  Result Date: 02/13/2022 CLINICAL DATA:  Tachycardia; AFib.  RVR EXAM: PORTABLE CHEST 1 VIEW COMPARISON:  Chest XR, most recently 09/01/2019. CT chest, 06/22/2021. FINDINGS: Cardiomediastinal silhouette is within normal limits. Lungs are well inflated. Well-corticated RIGHT perihilar opacity. No addition focal consolidation or mass. No pleural effusion or pneumothorax. No acute displaced fracture.  IMPRESSION: 1. No acute cardiopulmonary process. 2. Well corticated RIGHT perihilar opacity is new, and potentially superimposition versus external to patient. If continued clinical concern, consider two-view (AP + Lat) chest XRs for further evaluation Electronically Signed   By: JMichaelle BirksM.D.   On: 02/13/2022 16:49    Procedures Procedures    Medications Ordered in ED Medications  diltiazem (CARDIZEM) 125 mg in dextrose 5% 125 mL (1 mg/mL)  infusion (5 mg/hr Intravenous New Bag/Given 02/13/22 1630)  lactated ringers infusion ( Intravenous New Bag/Given 02/13/22 1801)  heparin ADULT infusion 100 units/mL (25000 units/221m) (900 Units/hr Intravenous New Bag/Given 02/13/22 2030)  rosuvastatin (CRESTOR) tablet 20 mg (20 mg Oral Given 02/13/22 2028)  diltiazem (CARDIZEM) injection 10 mg (10 mg Intravenous Given 02/13/22 1637)  magnesium sulfate IVPB 2 g 50 mL (0 g Intravenous Stopped 02/13/22 1853)  potassium chloride SA (KLOR-CON M) CR tablet 40 mEq (40 mEq Oral Given 02/13/22 1752)  iohexol (OMNIPAQUE) 350 MG/ML injection 100 mL (100 mLs Intravenous Contrast Given 02/13/22 1840)  heparin bolus via infusion 3,500 Units (3,500 Units Intravenous Bolus from Bag 02/13/22 2030)    ED Course/ Medical Decision Making/ A&P                           Medical Decision Making Amount and/or Complexity of Data Reviewed Labs: ordered. Radiology: ordered.  Risk Prescription drug management. Decision regarding hospitalization.   This patient presents to the ED for concern of tachycardia, this involves an extensive number of treatment options, and is a complaint that carries with it a high risk of complications and morbidity.  The differential diagnosis includes atrial fibrillation with RVR, SVT, atrial flutter, sinus tachycardia, electrolyte disturbances, infection, ACS   Co morbidities that complicate the patient evaluation  HLD, HTN, depression, GERD, IBS, anxiety   Additional history  obtained:  Additional history obtained from EMS External records from outside source obtained and reviewed including EMR   Lab Tests:  I Ordered, and personally interpreted labs.  The pertinent results include: Hypokalemia and hypomagnesemia.  Patient's creatinine is slightly above baseline.  A leukocytosis is present.  Troponin is normal.   Imaging Studies ordered:  I ordered imaging studies including chest x-ray, CTA chest I independently visualized and interpreted imaging which showed right perihilar opacity identified on chest x-ray prompted CT scan of chest.  CT scan showed no acute findings. I agree with the radiologist interpretation   Cardiac Monitoring: / EKG:  The patient was maintained on a cardiac monitor.  I personally viewed and interpreted the cardiac monitored which showed an underlying rhythm of: Atrial fibrillation   Consultations Obtained:  I requested consultation with the cardiologist, Dr. TTerri Skains  and discussed lab and imaging findings as well as pertinent plan - they recommend: Agree with heparin and rate control.  Suspect that troponinemia secondary to demand ischemia from rapid heart rate.  They will follow in consult and see in the morning.   Problem List / ED Course / Critical interventions / Medication management  Patient is a pleasant 71year old female presenting for acute onset of palpitations, left-sided chest tightness and feelings of lightheadedness.  EMS arrived on scene and identified irregularly irregular tachycardia.  She was given Cardizem with improvement in heart rate and symptoms prior to arrival.  On arrival, patient endorses recurrence of symptoms and is found to have recurrence of tachycardia in the range of 140s.     Additional 10 mg of Cardizem was given and patient was started on gtt.  Laboratory work-up was initiated to assess for underlying inciting factors.  On reassessment, patient resting comfortably with normal heart rate in the  90s.  Laboratory work-up showed hypokalemia and hypomagnesemia.  Replacement electrolytes were ordered.  Patient also has a leukocytosis.  This could be secondary to recent steroid use.  On chest x-ray, there was identification of a left perihilar opacity.  CTA chest was  ordered to assess for possible PE as well as to further characterize chest x-ray finding.  CTA chest showed no acute findings.  On reassessment, patient resting comfortably.  She has had resolution of symptoms with rate control.  While in the ED, she did convert to normal sinus rhythm.  Patient was started on heparin gtt.  Her second troponin was elevated at 114.  I suspect that this may be secondary to demand ischemia from her rapid heart rate.  I spoke with cardiologist on-call, Dr. Terri Skains who agrees with atrial fibrillation dosing of heparin, continuing to trend troponins, and he states that he will follow in consult and see her in the morning.  Patient was admitted to medicine for further management. I ordered medication including Cardizem for rate control; potassium chloride for hypokalemia; magnesium sulfate for hypomagnesemia; heparin for atrial fibrillation Reevaluation of the patient after these medicines showed that the patient resolved I have reviewed the patients home medicines and have made adjustments as needed   Social Determinants of Health:  Lives at home with her sister.  CRITICAL CARE Performed by: Godfrey Pick   Total critical care time: 35 minutes  Critical care time was exclusive of separately billable procedures and treating other patients.  Critical care was necessary to treat or prevent imminent or life-threatening deterioration.  Critical care was time spent personally by me on the following activities: development of treatment plan with patient and/or surrogate as well as nursing, discussions with consultants, evaluation of patient's response to treatment, examination of patient, obtaining history from  patient or surrogate, ordering and performing treatments and interventions, ordering and review of laboratory studies, ordering and review of radiographic studies, pulse oximetry and re-evaluation of patient's condition.         Final Clinical Impression(s) / ED Diagnoses Final diagnoses:  Atrial fibrillation with RVR ([place])  Hypokalemia  Hypomagnesemia    Rx / DC Orders ED Discharge Orders          Ordered    Amb referral to AFIB Clinic        02/13/22 1613              Godfrey Pick, MD 02/13/22 1610    Godfrey Pick, MD 02/13/22 2207

## 2022-02-13 NOTE — Progress Notes (Signed)
ON-CALL CARDIOLOGY 02/13/22  Patient's name: Kristen Wilson.   MRN: [id number].    DOB: [birth year] Primary care provider: [name], MD. Primary cardiologist: NA  Interaction regarding this patient's care today: Dr. Doren Custard reached out to for consult on [name] who presents w/ palpitation, chest discomfort, noted to be in Afib w/ RVR. Once the ventricular rate improved chest discomfort resolved.  HS Troponin checked which has trended up - likely demand ischemia per Dr. Doren Custard, agree.   Requesting consult on Afib and elevated troponin.   Recommendations: No ECGs transmitted for review at the time of the phone call.  If Afib and no contraindication for anticoagulation and low bleeding risk start AC.  Patient has multiple electrolyte abnormalities and elevated WBCs.  Recommend medicine to admit and cardiology consult in morning.  Repeat EKG and transmit to EMR. Call if questions arise.   Full consult forthcoming.   No charge.   Rex Kras, [place], [facility]  Pager: [phone] Office: [phone]

## 2022-02-13 NOTE — H&P (Signed)
History and Physical  Kristen Wilson TUU:[id number] DOB: [birth year] DOA: 02/13/2022  Referring physician: Dr. Doren Custard, [place]  PCP: [name], MD  Outpatient Specialists: Vascular surgery, pulmonology. Patient coming from: Home via EMS.  Chief Complaint: Palpitations, leg chest tightness.  HPI: Kristen Wilson is a 71 y.o. female with medical history significant for essential hypertension, hyperlipidemia, GERD, COPD, former tobacco user, quit 6 days ago, recently treated for bronchitis, currently finishing a course of steroids and doxycycline today, who presented to [facility] ED via EMS due to sudden onset palpitations while at rest.  Associated with left-sided chest tightness.  EMS was activated.  Upon arrival, the patient was found to be in A-fib with rapid ventricular response, rate in the 150s to 190s without hypotension.  She was given 10 mg of Cardizem by EMS in route with improvement of heart rate in the 90s to 100s.  She was brought into the ED for further evaluation.    Upon arrival to the ED, she had recurrence of tachycardia and recurrence of left sided chest tightness and a feeling of wooziness.  She received an additional 10 mg of IV Cardizem and was started on Cardizem drip in the ER.  Lab studies were remarkable for hypokalemia and hypomagnesia which were repleted in the ED.  Initial high-sensitivity troponin was negative, repeated was elevated, greater than 100.  EDP discussed the case with cardiology who recommended to start heparin drip.  The patient was admitted by the hospitalist service, [facility].  ED Course: Tmax 98.3.  BP 95/48, pulse 61, respiratory rate 13, with saturation 98% on room air.  Lab studies remarkable for serum sodium 134, potassium 3.2, serum glucose 156, creatinine 1.01 with baseline creatinine of 0.7.  GFR 60.  Magnesium 1.5.  High-sensitivity troponin 12, 114.  WBC 19.4.  Hemoglobin 15.6.  Platelet 205.  Chest x-ray no acute cardiopulmonary disease.  Review of  Systems: Review of systems as noted in the HPI. All other systems reviewed and are negative.   Past Medical History:  Diagnosis Date   Allergy    Anginal pain ([place])    hx- non recent - r/t anxiety/panic attack   Anxiety    Arthritis    "Whole body"   Bronchitis    uses inhaler prn   Cough    smoker s cough   Depression    Diverticulitis    GERD (gastroesophageal reflux disease)    Hiatal hernia    Hyperlipidemia    Hypertension    IBS (irritable bowel syndrome)    Osteoporosis    Pneumonia    hx   Primary localized osteoarthritis of left knee    Shortness of breath    occ   Smoker    SVD (spontaneous vaginal delivery)    x 2   Upper respiratory tract infection 06/14/2015   Starting IV Rocephin today also starting nebulizers for wheezing   Past Surgical History:  Procedure Laterality Date   CHOLECYSTECTOMY     COLONOSCOPY N/A 03/20/2013   Procedure: COLONOSCOPY;  Surgeon: Wonda Horner, MD;  Location: [facility];  Service: Endoscopy;  Laterality: N/A;   CYST EXCISION PERINEAL  03/20/2018   Procedure: INCISION AND DRAINAGE OF PERINEAL CYST;  Surgeon: Brien Few, MD;  Location: [facility];  Service: Gynecology;;   DILATION AND CURETTAGE OF UTERUS     KNEE ARTHROSCOPY     left   TONSILLECTOMY     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   Procedure: LEFT TOTAL KNEE ARTHROPLASTY;  Surgeon: Elsie Saas, MD;  Location: [facility];  Service: Orthopedics;  Laterality: Left;   TUBAL LIGATION     UPPER GI ENDOSCOPY     reflux, hiatal hernia   VULVECTOMY N/A 03/20/2018   Procedure: WIDE EXCISION VULVECTOMY;  Surgeon: Brien Few, MD;  Location: [facility];  Service: Gynecology;  Laterality: N/A;   WISDOM TOOTH EXTRACTION      Social History:  reports that she has been smoking cigarettes. She has a 60.00 pack-year smoking history. She has never used smokeless tobacco. She reports that she does not drink alcohol and does not use drugs.   Allergies  Allergen Reactions    Latex Hives   Nsaids     Aleve / Ibuprofen  - causes Blood Pressure to rise   Percocet [Oxycodone-Acetaminophen] Nausea And Vomiting    Family History  Problem Relation Age of Onset   Cancer Mother        breast   Arthritis Mother    Diabetes Mother    Cancer Father        bladder,multiple myeloma      Prior to Admission medications   Medication Sig Start Date End Date Taking? Authorizing Provider  albuterol (PROVENTIL HFA;VENTOLIN HFA) 108 (90 BASE) MCG/ACT inhaler Inhale 2 puffs into the lungs every 6 (six) hours as needed for wheezing or shortness of breath. 06/15/15   Shepperson, Kirstin, PA-C  ALPRAZolam (XANAX) 0.5 MG tablet Take 0.5 mg by mouth 3 (three) times daily as needed. 12/12/21   [provider]  aspirin 81 MG chewable tablet 1 tablet 10/06/13   [provider]  Cholecalciferol (VITAMIN D3) 2000 units TABS Take 2,000 Units by mouth daily.    [provider]  clonazePAM (KLONOPIN) 0.5 MG tablet Take 0.25 mg by mouth at bedtime.     [provider]  docusate sodium (COLACE) 100 MG capsule 1 tab 2 times a day while on narcotics.  STOOL SOFTENER 06/15/15   Shepperson, Kirstin, PA-C  DULoxetine (CYMBALTA) 30 MG capsule Take 90 mg by mouth at bedtime.     [provider]  DULoxetine (CYMBALTA) 60 MG capsule Take 120 mg by mouth daily. 01/08/22   [provider]  hydrochlorothiazide (HYDRODIURIL) 25 MG tablet Take 25 mg by mouth daily.     [provider]  irbesartan (AVAPRO) 300 MG tablet Take 300 mg by mouth daily. 11/18/21   [provider]  meloxicam (MOBIC) 15 MG tablet Take 15 mg by mouth daily. 01/02/22   [provider]  metoprolol (LOPRESSOR) 50 MG tablet Take 75 mg by mouth 2 (two) times daily.     [provider]  Multiple Vitamin (MULTIVITAMIN) tablet Take 1 tablet by mouth daily.    [provider]  neomycin-polymyxin b-dexamethasone (MAXITROL) 3.5-10000-0.1 OINT Place 1  application into both eyes at bedtime. 12/25/17   [provider]  Omega-3 Fatty Acids (FISH OIL) 1000 MG CAPS Take 1,000 mg by mouth daily.    [provider]  pantoprazole (PROTONIX) 40 MG tablet Take 40 mg by mouth daily.     [provider]  polyethylene glycol (MIRALAX / GLYCOLAX) packet 17grams in 16 oz of water twice a day until bowel movement.  LAXITIVE.  Restart if two days since last bowel movement Patient taking differently: 17grams in 16 oz of water twice a day until bowel movement.  LAXITIVE.  Restart if two days since last bowel movement 06/15/15   Shepperson, Kirstin, PA-C  rosuvastatin (CRESTOR) 20 MG  tablet Take 20 mg by mouth daily. 10/23/21   [provider]    Physical Exam: BP 98/68   Pulse 61   Temp 98.3 F (36.8 C) (Oral)   Resp 20   Ht _0  (1.549 m)   Wt 61.2 kg   SpO2 98%   BMI 25.51 kg/m   General: 71 y.o. year-old female well developed well nourished in no acute distress.  Alert and oriented x3. Cardiovascular: Irregular rate and rhythm with no rubs or gallops.  No thyromegaly or JVD noted.  No lower extremity edema. 2/4 pulses in all 4 extremities. Respiratory: Clear to auscultation with no wheezes or rales. Good inspiratory effort. Abdomen: Soft nontender nondistended with normal bowel sounds x4 quadrants. Muskuloskeletal: No cyanosis, clubbing or edema noted bilaterally Neuro: CN II-XII intact, strength, sensation, reflexes Skin: No ulcerative lesions noted or rashes Psychiatry: Judgement and insight appear normal. Mood is appropriate for condition and setting          Labs on Admission:  Basic Metabolic Panel: Recent Labs  Lab 02/13/22 1615  NA 134*  K 3.2*  CL 97*  CO2 28  GLUCOSE 156*  BUN 14  CREATININE 1.01*  CALCIUM 8.0*  MG 1.5*   Liver Function Tests: No results for input(s): "AST", "ALT", "ALKPHOS", "BILITOT", "PROT", "ALBUMIN" in the last 168 hours. No results for input(s): "LIPASE", "AMYLASE"  in the last 168 hours. No results for input(s): "AMMONIA" in the last 168 hours. CBC: Recent Labs  Lab 02/13/22 1615  WBC 19.4*  HGB 15.6*  HCT 44.9  MCV 92.0  PLT 205   Cardiac Enzymes: No results for input(s): "CKTOTAL", "CKMB", "CKMBINDEX", "TROPONINI" in the last 168 hours.  BNP (last 3 results) No results for input(s): "BNP" in the last 8760 hours.  ProBNP (last 3 results) No results for input(s): "PROBNP" in the last 8760 hours.  CBG: No results for input(s): "GLUCAP" in the last 168 hours.  Radiological Exams on Admission: CT Angio Chest PE W and/or Wo Contrast  Result Date: 02/13/2022 CLINICAL DATA:  Pulmonary embolism (PE) suspected, high prob. palpitations. EMS reports HR was 190s with afib RVR on the monitor. Cardizem 76m IVP and 350cc NS. Alert and oriented x 4. EXAM: CT ANGIOGRAPHY CHEST WITH CONTRAST TECHNIQUE: Multidetector CT imaging of the chest was performed using the standard protocol during bolus administration of intravenous contrast. Multiplanar CT image reconstructions and MIPs were obtained to evaluate the vascular anatomy. RADIATION DOSE REDUCTION: This exam was performed according to the departmental dose-optimization program which includes automated exposure control, adjustment of the mA and/or kV according to patient size and/or use of iterative reconstruction technique. CONTRAST:  1017mOMNIPAQUE IOHEXOL 350 MG/ML SOLN COMPARISON:  None Available. FINDINGS: Cardiovascular: Satisfactory opacification of the pulmonary arteries to the segmental level. No evidence of pulmonary embolism. The main pulmonary artery is normal in caliber. Normal heart size. No significant pericardial effusion. The thoracic aorta is normal in caliber. No atherosclerotic plaque of the thoracic aorta. At least 2 vessel coronary artery calcifications. Mediastinum/Nodes: No enlarged mediastinal, hilar, or axillary lymph nodes. Thyroid gland, trachea, and esophagus demonstrate no significant  findings. Lungs/Pleura: Mild centrilobular emphysematous changes. No focal liver abnormality. No gallstones, gallbladder wall thickening, or pericholecystic fluid. No biliary dilatation. Upper Abdomen: Status post cholecystectomy.  No acute abnormality. Musculoskeletal: No chest wall abnormality. No suspicious lytic or blastic osseous lesions. No acute displaced fracture. Multilevel degenerative changes of the spine. Review of the MIP images confirms the above findings. IMPRESSION: 1.  No pulmonary embolus. 2. No acute intrathoracic abnormality. 3. Aortic Atherosclerosis (ICD10-I70.0) and Emphysema (ICD10-J43.9). Two vessel coronary calcification. Electronically Signed   By: Iven Finn M.D.   On: 02/13/2022 18:55   DG Chest Port 1 View  Result Date: 02/13/2022 CLINICAL DATA:  Tachycardia; AFib.  RVR EXAM: PORTABLE CHEST 1 VIEW COMPARISON:  Chest XR, most recently 09/01/2019. CT chest, 06/22/2021. FINDINGS: Cardiomediastinal silhouette is within normal limits. Lungs are well inflated. Well-corticated RIGHT perihilar opacity. No addition focal consolidation or mass. No pleural effusion or pneumothorax. No acute displaced fracture. IMPRESSION: 1. No acute cardiopulmonary process. 2. Well corticated RIGHT perihilar opacity is new, and potentially superimposition versus external to patient. If continued clinical concern, consider two-view (AP + Lat) chest XRs for further evaluation Electronically Signed   By: Michaelle Birks M.D.   On: 02/13/2022 16:49    EKG: I independently viewed the EKG done and my findings are as followed: Sinus rhythm rate of 61.  Nonspecific ST-T changes.  QTc 439.  Assessment/Plan Present on Admission:  New onset a-fib ([place])  Principal Problem:   New onset a-fib ([place])  New onset A-fib with RVR Presented with palpitations, left chest tightness Received 10 mg of Cardizem in route by EMS In the ED received IV Cardizem bolus 10 mg x 1 and was started on Cardizem drip. CHA2DS2-VASc  score 3 Started on heparin drip in the ED Obtain complete 2D echo. TSH, 0.781 on 02/13/2022. Monitor on telemetry and repeat twelve-lead EKG Cardiology consulted by EDP  Elevated troponin, suspect demand ischemia in the setting of A-fib with RVR High-sensitivity troponin peaked at 114, trend No evidence of acute ischemia on twelve-lead EKG Currently on heparin drip per cardiologist recommendation for primary CVA prevention Follow 2D echo Obtain fasting lipid panel in the morning Management per cardiology  Hyperglycemia in the setting of recent steroid use Serum glucose 156 Now off steroid Obtain hemoglobin A1c  Hypokalemia/hypomagnesemia Presented with serum potassium 3.2 and serum magnesium 1.5 Repleted orally and intravenously, respectively, in the ED Repeat levels in the morning  Euvolemic hyponatremia in the setting of home HCTZ use Presented with serum sodium 134 Euvolemic on exam Hold off home HCTZ Repeat chemistry panel in the morning.  Essential hypertension Hold off home oral antihypertensives while on Cardizem drip to avoid hypotension. Closely monitor vital signs  GERD Resume home PPI  Chronic anxiety/depression Resume home regimen  Former tobacco user Endorses that she quit smoking 6 days ago. Encouraged to continue tobacco cessation. Nicotine patch as needed    Critical care time: 65 minutes.     DVT prophylaxis: Heparin drip.  Code Status: Full code  Family Communication: None at bedside  Disposition Plan: Admitted to progressive unit.  Consults called: Cardiology.  Admission status: Inpatient status.   Status is: Inpatient The patient requires at least 2 midnights for further evaluation and treatment of present condition.   Kayleen Memos MD [facility] Pager [phone]  If 7PM-7AM, please contact night-coverage [email or web address] Password [facility]  02/13/2022, 8:13 PM

## 2022-02-13 NOTE — Consult Note (Signed)
In Error.  Duplicate.  Patient seen by Dr. Einar Gip.   No charge.   Rex Kras, [place], [facility]  Pager: [phone] Office: [phone]

## 2022-02-13 NOTE — ED Triage Notes (Signed)
Pt called 911 for palpitations. EMS reports HR was 190s with afib RVR on the monitor. Cardizem '10mg'$  IVP and 350cc NS. Alert and oriented x 4.

## 2022-02-13 NOTE — ED Notes (Signed)
EKG given to Dr.Dixon.

## 2022-02-13 NOTE — Progress Notes (Signed)
ANTICOAGULATION CONSULT NOTE - Initial Consult  Pharmacy Consult for heparin Indication: atrial fibrillation  Allergies  Allergen Reactions   Latex Hives   Nsaids     Aleve / Ibuprofen  - causes Blood Pressure to rise   Percocet [Oxycodone-Acetaminophen] Nausea And Vomiting    Patient Measurements: Height: '5\' 1"'$  (154.9 cm) Weight: 61.2 kg (135 lb) IBW/kg (Calculated) : 47.8 Heparin Dosing Weight: 60.2kg  Vital Signs: Temp: 98.3 F (36.8 C) (06/20 1614) Temp Source: Oral (06/20 1614) BP: 98/55 (06/20 1900) Pulse Rate: 62 (06/20 1900)  Labs: Recent Labs    02/13/22 1615 02/13/22 1809  HGB 15.6*  --   HCT 44.9  --   PLT 205  --   CREATININE 1.01*  --   TROPONINIHS 12 114*    Estimated Creatinine Clearance: 42.9 mL/min (A) (by C-G formula based on SCr of 1.01 mg/dL (H)).   Medical History: Past Medical History:  Diagnosis Date   Allergy    Anginal pain ([place])    hx- non recent - r/t anxiety/panic attack   Anxiety    Arthritis    "Whole body"   Bronchitis    uses inhaler prn   Cough    smoker s cough   Depression    Diverticulitis    GERD (gastroesophageal reflux disease)    Hiatal hernia    Hyperlipidemia    Hypertension    IBS (irritable bowel syndrome)    Osteoporosis    Pneumonia    hx   Primary localized osteoarthritis of left knee    Shortness of breath    occ   Smoker    SVD (spontaneous vaginal delivery)    x 2   Upper respiratory tract infection 06/14/2015   Starting IV Rocephin today also starting nebulizers for wheezing    Assessment: 68 YOF presenting with palpitations, in afib, she is not on anticoagulation PTA, CBC wnl  Goal of Therapy:  Heparin level 0.3-0.7 units/ml Monitor platelets by anticoagulation protocol: Yes   Plan:  Heparin 3500 units IV x 1, and gtt at 900 units/hr F/u 6 hour heparin level  Bertis Ruddy, PharmD Clinical Pharmacist ED Pharmacist Phone # [phone] 02/13/2022 7:27 PM

## 2022-02-13 NOTE — ED Notes (Signed)
Unsuccessful IV start attempt x2.

## 2022-02-13 NOTE — ED Notes (Signed)
Critical troponin 114 reported to MD dixon at this time.

## 2022-02-14 ENCOUNTER — Encounter (HOSPITAL_COMMUNITY)
Admission: EM | Disposition: A | Discharge status: Home / Self Care | Payer: Self-pay | Source: Home / Self Care | Attending: Emergency Medicine

## 2022-02-14 ENCOUNTER — Encounter (HOSPITAL_COMMUNITY): Payer: Self-pay | Admitting: Cardiology

## 2022-02-14 ENCOUNTER — Inpatient Hospital Stay (HOSPITAL_COMMUNITY): Payer: Medicare Other

## 2022-02-14 ENCOUNTER — Other Ambulatory Visit (HOSPITAL_COMMUNITY): Payer: Self-pay

## 2022-02-14 ENCOUNTER — Telehealth: Payer: Self-pay

## 2022-02-14 DIAGNOSIS — I480 Paroxysmal atrial fibrillation: Secondary | ICD-10-CM | POA: Diagnosis present

## 2022-02-14 DIAGNOSIS — Z720 Tobacco use: Secondary | ICD-10-CM | POA: Diagnosis not present

## 2022-02-14 DIAGNOSIS — I214 Non-ST elevation (NSTEMI) myocardial infarction: Secondary | ICD-10-CM | POA: Diagnosis not present

## 2022-02-14 DIAGNOSIS — R778 Other specified abnormalities of plasma proteins: Secondary | ICD-10-CM

## 2022-02-14 DIAGNOSIS — R7989 Other specified abnormal findings of blood chemistry: Secondary | ICD-10-CM | POA: Diagnosis not present

## 2022-02-14 DIAGNOSIS — F17200 Nicotine dependence, unspecified, uncomplicated: Secondary | ICD-10-CM

## 2022-02-14 DIAGNOSIS — I4891 Unspecified atrial fibrillation: Secondary | ICD-10-CM | POA: Diagnosis present

## 2022-02-14 DIAGNOSIS — I10 Essential (primary) hypertension: Secondary | ICD-10-CM | POA: Diagnosis not present

## 2022-02-14 DIAGNOSIS — E7800 Pure hypercholesterolemia, unspecified: Secondary | ICD-10-CM | POA: Diagnosis not present

## 2022-02-14 HISTORY — PX: LEFT HEART CATH AND CORONARY ANGIOGRAPHY: CATH118249

## 2022-02-14 LAB — COMPREHENSIVE METABOLIC PANEL
ALT: 30 U/L (ref 0–44)
AST: 25 U/L (ref 15–41)
Albumin: 2.6 g/dL — ABNORMAL LOW (ref 3.5–5.0)
Alkaline Phosphatase: 56 U/L (ref 38–126)
Anion gap: 7 (ref 5–15)
BUN: 15 mg/dL (ref 8–23)
CO2: 28 mmol/L (ref 22–32)
Calcium: 7.8 mg/dL — ABNORMAL LOW (ref 8.9–10.3)
Chloride: 100 mmol/L (ref 98–111)
Creatinine, Ser: 0.79 mg/dL (ref 0.44–1.00)
GFR, Estimated: >60 mL/min (ref >60)
Glucose, Bld: 122 mg/dL — ABNORMAL HIGH (ref 70–99)
Potassium: 3.6 mmol/L (ref 3.5–5.1)
Sodium: 135 mmol/L (ref 135–145)
Total Bilirubin: 0.7 mg/dL (ref 0.3–1.2)
Total Protein: 4.2 g/dL — ABNORMAL LOW (ref 6.5–8.1)

## 2022-02-14 LAB — CBC WITH DIFFERENTIAL/PLATELET
Abs Immature Granulocytes: 0.31 10*3/uL — ABNORMAL HIGH (ref 0.00–0.07)
Basophils Absolute: 0.1 10*3/uL (ref 0.0–0.1)
Basophils Relative: 0 %
Eosinophils Absolute: 0.2 10*3/uL (ref 0.0–0.5)
Eosinophils Relative: 1 %
HCT: 42.4 % (ref 36.0–46.0)
Hemoglobin: 14.5 g/dL (ref 12.0–15.0)
Immature Granulocytes: 2 %
Lymphocytes Relative: 24 %
Lymphs Abs: 3.5 10*3/uL (ref 0.7–4.0)
MCH: 31.9 pg (ref 26.0–34.0)
MCHC: 34.2 g/dL (ref 30.0–36.0)
MCV: 93.4 fL (ref 80.0–100.0)
Monocytes Absolute: 0.8 10*3/uL (ref 0.1–1.0)
Monocytes Relative: 5 %
Neutro Abs: 9.8 10*3/uL — ABNORMAL HIGH (ref 1.7–7.7)
Neutrophils Relative %: 68 %
Platelets: 159 10*3/uL (ref 150–400)
RBC: 4.54 MIL/uL (ref 3.87–5.11)
RDW: 13.2 % (ref 11.5–15.5)
WBC: 14.7 10*3/uL — ABNORMAL HIGH (ref 4.0–10.5)
nRBC: 0 % (ref 0.0–0.2)

## 2022-02-14 LAB — HEPARIN LEVEL (UNFRACTIONATED): Heparin Unfractionated: 0.54 IU/mL (ref 0.30–0.70)

## 2022-02-14 LAB — LIPID PANEL
Cholesterol: 78 mg/dL (ref 0–200)
HDL: 32 mg/dL — ABNORMAL LOW (ref >40)
LDL Cholesterol: 33 mg/dL (ref 0–99)
Total CHOL/HDL Ratio: 2.4 RATIO
Triglycerides: 67 mg/dL (ref <150)
VLDL: 13 mg/dL (ref 0–40)

## 2022-02-14 LAB — MAGNESIUM: Magnesium: 1.9 mg/dL (ref 1.7–2.4)

## 2022-02-14 LAB — HEMOGLOBIN A1C
Hgb A1c MFr Bld: 5.5 % (ref 4.8–5.6)
Mean Plasma Glucose: 111.15 mg/dL

## 2022-02-14 LAB — PHOSPHORUS: Phosphorus: 2.6 mg/dL (ref 2.5–4.6)

## 2022-02-14 SURGERY — LEFT HEART CATH AND CORONARY ANGIOGRAPHY
Anesthesia: LOCAL

## 2022-02-14 MED ORDER — VERAPAMIL HCL 2.5 MG/ML IV SOLN
INTRAVENOUS | Status: DC | PRN
  Administered 2022-02-14: 10 mL via INTRA_ARTERIAL

## 2022-02-14 MED ORDER — SODIUM CHLORIDE 0.9% FLUSH: 3.0000 mL | INTRAVENOUS | Status: DC | PRN

## 2022-02-14 MED ORDER — FENTANYL CITRATE (PF) 100 MCG/2ML IJ SOLN
INTRAMUSCULAR | Status: DC | PRN
  Administered 2022-02-14: 25 ug via INTRAVENOUS

## 2022-02-14 MED ORDER — SODIUM CHLORIDE 0.9% FLUSH: 3.0000 mL | Freq: Two times a day (BID) | INTRAVENOUS | Status: DC

## 2022-02-14 MED ORDER — HEPARIN SODIUM (PORCINE) 1000 UNIT/ML IJ SOLN
INTRAMUSCULAR | Status: DC | PRN
  Administered 2022-02-14: 5000 [IU] via INTRAVENOUS

## 2022-02-14 MED ORDER — HEPARIN (PORCINE) IN NACL 1000-0.9 UT/500ML-% IV SOLN
INTRAVENOUS | Status: AC
  Filled 2022-02-14: qty 1000

## 2022-02-14 MED ORDER — VERAPAMIL HCL 2.5 MG/ML IV SOLN
INTRAVENOUS | Status: AC
  Filled 2022-02-14: qty 2

## 2022-02-14 MED ORDER — ONDANSETRON HCL 4 MG/2ML IJ SOLN: 4.0000 mg | Freq: Four times a day (QID) | INTRAMUSCULAR | Status: DC | PRN

## 2022-02-14 MED ORDER — SODIUM CHLORIDE 0.9 % IV SOLN: 250.0000 mL | INTRAVENOUS | Status: DC | PRN

## 2022-02-14 MED ORDER — SODIUM CHLORIDE 0.9% FLUSH
3.0000 mL | Freq: Two times a day (BID) | INTRAVENOUS | Status: DC
Start: 2022-02-14 — End: 2022-02-14

## 2022-02-14 MED ORDER — SODIUM CHLORIDE 0.9 % WEIGHT BASED INFUSION
3.0000 mL/kg/h | INTRAVENOUS | Status: AC
  Administered 2022-02-14: 3 mL/kg/h via INTRAVENOUS

## 2022-02-14 MED ORDER — ASPIRIN 81 MG PO CHEW: 81.0000 mg | CHEWABLE_TABLET | ORAL | Status: DC

## 2022-02-14 MED ORDER — FENTANYL CITRATE (PF) 100 MCG/2ML IJ SOLN
INTRAMUSCULAR | Status: AC
  Filled 2022-02-14: qty 2

## 2022-02-14 MED ORDER — APIXABAN 5 MG PO TABS
5.0000 mg | ORAL_TABLET | Freq: Two times a day (BID) | ORAL | 0 refills | Status: DC
  Filled 2022-02-14: qty 60, 30d supply, fill #0

## 2022-02-14 MED ORDER — IOHEXOL 350 MG/ML SOLN
INTRAVENOUS | Status: DC | PRN
  Administered 2022-02-14: 20 mL

## 2022-02-14 MED ORDER — NICOTINE 21 MG/24HR TD PT24
21.0000 mg | MEDICATED_PATCH | Freq: Every day | TRANSDERMAL | Status: DC
  Administered 2022-02-14: 21 mg via TRANSDERMAL
  Filled 2022-02-14: qty 1

## 2022-02-14 MED ORDER — MIDAZOLAM HCL 2 MG/2ML IJ SOLN
INTRAMUSCULAR | Status: AC
  Filled 2022-02-14: qty 2

## 2022-02-14 MED ORDER — SODIUM CHLORIDE 0.9 % IV SOLN: INTRAVENOUS | Status: DC

## 2022-02-14 MED ORDER — MIDAZOLAM HCL 2 MG/2ML IJ SOLN
INTRAMUSCULAR | Status: DC | PRN
  Administered 2022-02-14: 2 mg via INTRAVENOUS

## 2022-02-14 MED ORDER — SODIUM CHLORIDE 0.9 % WEIGHT BASED INFUSION: 1.0000 mL/kg/h | INTRAVENOUS | Status: DC

## 2022-02-14 MED ORDER — LIDOCAINE HCL (PF) 1 % IJ SOLN
INTRAMUSCULAR | Status: AC
  Filled 2022-02-14: qty 30

## 2022-02-14 MED ORDER — HEPARIN SODIUM (PORCINE) 1000 UNIT/ML IJ SOLN
INTRAMUSCULAR | Status: AC
  Filled 2022-02-14: qty 10

## 2022-02-14 MED ORDER — METOPROLOL TARTRATE 25 MG PO TABS: 25.0000 mg | ORAL_TABLET | Freq: Two times a day (BID) | ORAL | Status: DC

## 2022-02-14 MED ORDER — LIDOCAINE HCL (PF) 1 % IJ SOLN
INTRAMUSCULAR | Status: DC | PRN
  Administered 2022-02-14: 2 mL

## 2022-02-14 MED ORDER — METOPROLOL TARTRATE 50 MG PO TABS
50.0000 mg | ORAL_TABLET | Freq: Two times a day (BID) | ORAL | 1 refills | Status: AC
  Filled 2022-02-14: qty 60, 30d supply, fill #0

## 2022-02-14 MED ORDER — HEPARIN (PORCINE) IN NACL 1000-0.9 UT/500ML-% IV SOLN
INTRAVENOUS | Status: DC | PRN
  Administered 2022-02-14 (×2): 500 mL

## 2022-02-14 SURGICAL SUPPLY — 11 items
BAND CMPR LRG ZPHR (HEMOSTASIS) ×1
BAND ZEPHYR COMPRESS 30 LONG (HEMOSTASIS) ×1 IMPLANT
CATH OPTITORQUE TIG 4.0 5F (CATHETERS) ×1 IMPLANT
GLIDESHEATH SLEND A-KIT 6F 22G (SHEATH) ×1 IMPLANT
GUIDEWIRE ANGLED .035X150CM (WIRE) ×1 IMPLANT
GUIDEWIRE INQWIRE 1.5J.035X260 (WIRE) IMPLANT
INQWIRE 1.5J .035X260CM (WIRE) ×2
KIT HEART LEFT (KITS) ×3 IMPLANT
PACK CARDIAC CATHETERIZATION (CUSTOM PROCEDURE TRAY) ×3 IMPLANT
TRANSDUCER W/STOPCOCK (MISCELLANEOUS) ×3 IMPLANT
TUBING CIL FLEX 10 FLL-RA (TUBING) ×3 IMPLANT

## 2022-02-14 NOTE — Care Management Obs Status (Signed)
[facility] NOTIFICATION   Patient Details  Name: Kristen Wilson MRN: [id number] Date of Birth: 05/10/1951   Medicare Observation Status Notification Given:  Yes    Fuller Mandril, RN 02/14/2022, 11:56 AM

## 2022-02-14 NOTE — Interval H&P Note (Signed)
History and Physical Interval Note:  02/14/2022 9:28 AM  Kristen Wilson  has presented today for surgery, with the diagnosis of nstemi.  The various methods of treatment have been discussed with the patient and family. After consideration of risks, benefits and other options for treatment, the patient has consented to  Procedure(s): LEFT HEART CATH AND CORONARY ANGIOGRAPHY (N/A) and possible coronary angioplasty as a surgical intervention.  The patient's history has been reviewed, patient examined, no change in status, stable for surgery.  I have reviewed the patient's chart and labs.  Questions were answered to the patient's satisfaction.   I have discussed with the patient and also called her sister [name] who she lives with and discussed the risks associated with cardiac catheterization including but not limited to <1% risk of death, stroke, MI, need for urgent CABG.  Adrian Prows

## 2022-02-14 NOTE — Telephone Encounter (Signed)
Correction : Discharge date is 02/14/2022

## 2022-02-14 NOTE — Care Management CC44 (Signed)
Condition Code 44 Documentation Completed  Patient Details  Name: BRENTNEY GOLDBACH MRN: [id number] Date of Birth: 1951/02/25   Condition Code 44 given:  Yes Patient signature on Condition Code 44 notice:  Yes Documentation of 2 MD's agreement:  Yes Code 44 added to claim:  Yes    Fuller Mandril, RN 02/14/2022, 11:56 AM

## 2022-02-14 NOTE — H&P (View-Only) (Signed)
CARDIOLOGY CONSULT NOTE  Patient ID: Kristen Wilson MRN: [id number] DOB/AGE: [birth year] 71 y.o.  Admit date: 02/13/2022 Referring [place], DO Primary Physician:  [name], MD Reason for Consultation  New onset atrial fibrillation  Patient ID: Kristen Wilson, female    DOB: 07/12/1951, 71 y.o.   MRN: [id number]  Chief Complaint  Patient presents with   Afib RVR   HPI:    Kristen Wilson  is a 71 y.o. Caucasian female with hypertension, hyperlipidemia, GERD, IBS, chronic palpitations and on beta-blocker therapy, who is admitted to the hospital on 02/13/2022 with chest pain, palpitations and lightheadedness and was brought to ED via EMS.  In the field patient was found to have A-fib with RVR, received diltiazem.  She had converted to sinus rhythm in the ambulance however had recurrence of atrial fibrillation and started on IV diltiazem.  She had recurrence of chest discomfort with A-fib with RVR.  We were consulted for management of atrial fibrillation and also elevated high-sensitivity troponins.  Presently states that she is doing well, has not had any further palpitations or chest pain.  Past Medical History:  Diagnosis Date   Allergy    Anginal pain ([place])    hx- non recent - r/t anxiety/panic attack   Anxiety    Arthritis    "Whole body"   Bronchitis    uses inhaler prn   Cough    smoker s cough   Depression    Diverticulitis    GERD (gastroesophageal reflux disease)    Hiatal hernia    Hyperlipidemia    Hypertension    IBS (irritable bowel syndrome)    Osteoporosis    Pneumonia    hx   Primary localized osteoarthritis of left knee    Shortness of breath    occ   Smoker    SVD (spontaneous vaginal delivery)    x 2   Upper respiratory tract infection 06/14/2015   Starting IV Rocephin today also starting nebulizers for wheezing   Past Surgical History:  Procedure Laterality Date   CHOLECYSTECTOMY     COLONOSCOPY N/A 03/20/2013   Procedure:  COLONOSCOPY;  Surgeon: Wonda Horner, MD;  Location: [facility];  Service: Endoscopy;  Laterality: N/A;   CYST EXCISION PERINEAL  03/20/2018   Procedure: INCISION AND DRAINAGE OF PERINEAL CYST;  Surgeon: Brien Few, MD;  Location: [facility];  Service: Gynecology;;   DILATION AND CURETTAGE OF UTERUS     KNEE ARTHROSCOPY     left   TONSILLECTOMY     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   Procedure: LEFT TOTAL KNEE ARTHROPLASTY;  Surgeon: Elsie Saas, MD;  Location: [facility];  Service: Orthopedics;  Laterality: Left;   TUBAL LIGATION     UPPER GI ENDOSCOPY     reflux, hiatal hernia   VULVECTOMY N/A 03/20/2018   Procedure: WIDE EXCISION VULVECTOMY;  Surgeon: Brien Few, MD;  Location: [facility];  Service: Gynecology;  Laterality: N/A;   WISDOM TOOTH EXTRACTION     Social History   Tobacco Use   Smoking status: Every Day    Packs/day: 1.50    Years: 40.00    Total pack years: 60.00    Types: Cigarettes   Smokeless tobacco: Never  Substance Use Topics   Alcohol use: No    Family History  Problem Relation Age of Onset   Cancer Mother        breast   Arthritis Mother    Diabetes Mother  Cancer Father        bladder,multiple myeloma    Marital Status: Widowed  ROS  Review of Systems  Cardiovascular:  Positive for dyspnea on exertion. Negative for chest pain and leg swelling.  Respiratory:  Positive for cough.    Objective      02/14/2022    8:03 AM 02/14/2022    6:30 AM 02/14/2022    6:15 AM  Vitals with BMI  Systolic [phone]  Diastolic 65 63 62  Pulse 61 59 62    Blood pressure 136/65, pulse 61, temperature 98.3 F (36.8 C), temperature source Oral, resp. rate 14, height 5' 1" (1.549 m), weight 61.2 kg, SpO2 100 %.  Physical Exam Constitutional:      Appearance: She is obese.  Neck:     Vascular: No JVD.  Cardiovascular:     Rate and Rhythm: Normal rate and regular rhythm.     Pulses:          Dorsalis pedis pulses are 1+ on the right side and 0 on  the left side.       Posterior tibial pulses are 0 on the right side and 0 on the left side.     Heart sounds: Heart sounds are distant. No murmur heard.    No gallop.  Pulmonary:     Effort: Pulmonary effort is normal.     Breath sounds: Normal breath sounds.  Abdominal:     General: Bowel sounds are normal.     Palpations: Abdomen is soft.  Musculoskeletal:     Right lower leg: No edema.     Left lower leg: No edema.    Laboratory examination:   Recent Labs    02/13/22 1615 02/14/22 0530  NA 134* 135  K 3.2* 3.6  CL 97* 100  CO2 28 28  GLUCOSE 156* 122*  BUN 14 15  CREATININE 1.01* 0.79  CALCIUM 8.0* 7.8*  GFRNONAA 60* >60   estimated creatinine clearance is 54.2 mL/min (by C-G formula based on SCr of 0.79 mg/dL).     Latest Ref Rng & Units 02/14/2022    5:30 AM 02/13/2022    4:15 PM 10/31/2020    2:57 PM  CMP  Glucose 70 - 99 mg/dL 122  156  97   BUN 8 - 23 mg/dL _0 Creatinine 0.44 - 1.00 mg/dL 0.79  1.01  0.79   Sodium 135 - 145 mmol/L 135  134  139   Potassium 3.5 - 5.1 mmol/L 3.6  3.2  3.5   Chloride 98 - 111 mmol/L 100  97  97   CO2 22 - 32 mmol/L 28  28  32   Calcium 8.9 - 10.3 mg/dL 7.8  8.0  9.2   Total Protein 6.5 - 8.1 g/dL 4.2   6.7   Total Bilirubin 0.3 - 1.2 mg/dL 0.7   0.9   Alkaline Phos 38 - 126 U/L 56   75   AST 15 - 41 U/L 25   32   ALT 0 - 44 U/L 30   28       Latest Ref Rng & Units 02/14/2022    5:30 AM 02/13/2022    4:15 PM 10/31/2020    2:57 PM  CBC  WBC 4.0 - 10.5 K/uL 14.7  19.4  6.9   Hemoglobin 12.0 - 15.0 g/dL 14.5  15.6  15.8   Hematocrit 36.0 - 46.0 % 42.4  44.9  46.1   Platelets 150 - 400 K/uL 159  205  164    Lipid Panel Recent Labs    02/14/22 0530  CHOL 78  TRIG 67  LDLCALC 33  VLDL 13  HDL 32*  CHOLHDL 2.4    HEMOGLOBIN A1C Lab Results  Component Value Date   HGBA1C 5.5 02/13/2022   MPG 111.15 02/13/2022   TSH Recent Labs    02/13/22 1615  TSH 0.781   BNP (last 3 results) No results for  input(s): "BNP" in the last 8760 hours.  Cardiac Panel (last 3 results) Recent Labs    02/13/22 1809 02/13/22 2035 02/13/22 2135  TROPONINIHS 114* 362* 492*     Medications and allergies   Allergies  Allergen Reactions   Latex Hives   Nsaids     Aleve / Ibuprofen  - causes Blood Pressure to rise   Percocet [Oxycodone-Acetaminophen] Nausea And Vomiting     Current Meds  Medication Sig   albuterol (PROVENTIL HFA;VENTOLIN HFA) 108 (90 BASE) MCG/ACT inhaler Inhale 2 puffs into the lungs every 6 (six) hours as needed for wheezing or shortness of breath.   ALPRAZolam (XANAX) 0.5 MG tablet Take 0.5 mg by mouth daily as needed for anxiety.   aspirin 81 MG chewable tablet Chew 81 mg by mouth daily.   Cholecalciferol (VITAMIN D3) 2000 units TABS Take 2,000 Units by mouth daily.   Cyanocobalamin (VITAMIN B-12 PO) Take 1 tablet by mouth daily.   docusate sodium (COLACE) 100 MG capsule 1 tab 2 times a day while on narcotics.  STOOL SOFTENER   DULoxetine (CYMBALTA) 60 MG capsule Take 120 mg by mouth 2 (two) times daily.   hydrochlorothiazide (HYDRODIURIL) 25 MG tablet Take 25 mg by mouth daily.    irbesartan (AVAPRO) 300 MG tablet Take 300 mg by mouth daily.   metoprolol (LOPRESSOR) 50 MG tablet Take 25 mg by mouth 2 (two) times daily.   Multiple Vitamin (MULTIVITAMIN) tablet Take 1 tablet by mouth daily.   Omega-3 Fatty Acids (FISH OIL) 1000 MG CAPS Take 1,000 mg by mouth daily.   pantoprazole (PROTONIX) 40 MG tablet Take 40 mg by mouth daily.    polyethylene glycol (MIRALAX / GLYCOLAX) packet 17grams in 16 oz of water twice a day until bowel movement.  LAXITIVE.  Restart if two days since last bowel movement (Patient taking differently: 17grams in 16 oz of water twice a day until bowel movement.  LAXITIVE.  Restart if two days since last bowel movement)   Potassium 99 MG TABS Take 1 tablet by mouth daily.   rosuvastatin (CRESTOR) 20 MG tablet Take 20 mg by mouth daily.    Scheduled  Meds:  [MAR Hold] aspirin  81 mg Oral Daily   [MAR Hold] DULoxetine  120 mg Oral BID   [MAR Hold] metoprolol tartrate  25 mg Oral BID   [MAR Hold] nicotine  21 mg Transdermal Daily   [MAR Hold] pantoprazole  40 mg Oral Daily   [MAR Hold] rosuvastatin  20 mg Oral Daily   sodium chloride flush  3 mL Intravenous Q12H   [MAR Hold] vitamin B-12  500 mcg Oral Daily   Continuous Infusions:  sodium chloride     sodium chloride     Followed by   sodium chloride     heparin Stopped (02/14/22 0912)   lactated ringers 125 mL/hr at 02/14/22 0142   PRN Meds:.sodium chloride, [MAR Hold] acetaminophen, [MAR Hold] ALPRAZolam, Heparin (Porcine) in NaCl, [MAR Hold] melatonin, [MAR  Hold] oxyCODONE, [MAR Hold] polyethylene glycol, [MAR Hold] prochlorperazine, sodium chloride flush   I/O last 3 completed shifts: In: 78 [IV Piggyback:50] Out: -  No intake/output data recorded.    Radiology:   CT Angio Chest PE W and/or Wo Contrast  Result Date: 02/13/2022 CLINICAL DATA:  Pulmonary embolism (PE) suspected, high prob. palpitations. EMS reports HR was 190s with afib RVR on the monitor. Cardizem 23m IVP and 350cc NS. Alert and oriented x 4. EXAM: CT ANGIOGRAPHY CHEST WITH CONTRAST TECHNIQUE: Multidetector CT imaging of the chest was performed using the standard protocol during bolus administration of intravenous contrast. Multiplanar CT image reconstructions and MIPs were obtained to evaluate the vascular anatomy. RADIATION DOSE REDUCTION: This exam was performed according to the departmental dose-optimization program which includes automated exposure control, adjustment of the mA and/or kV according to patient size and/or use of iterative reconstruction technique. CONTRAST:  1017mOMNIPAQUE IOHEXOL 350 MG/ML SOLN COMPARISON:  None Available. FINDINGS: Cardiovascular: Satisfactory opacification of the pulmonary arteries to the segmental level. No evidence of pulmonary embolism. The main pulmonary artery is  normal in caliber. Normal heart size. No significant pericardial effusion. The thoracic aorta is normal in caliber. No atherosclerotic plaque of the thoracic aorta. At least 2 vessel coronary artery calcifications. Mediastinum/Nodes: No enlarged mediastinal, hilar, or axillary lymph nodes. Thyroid gland, trachea, and esophagus demonstrate no significant findings. Lungs/Pleura: Mild centrilobular emphysematous changes. No focal liver abnormality. No gallstones, gallbladder wall thickening, or pericholecystic fluid. No biliary dilatation. Upper Abdomen: Status post cholecystectomy.  No acute abnormality. Musculoskeletal: No chest wall abnormality. No suspicious lytic or blastic osseous lesions. No acute displaced fracture. Multilevel degenerative changes of the spine. Review of the MIP images confirms the above findings. IMPRESSION: 1. No pulmonary embolus. 2. No acute intrathoracic abnormality. 3. Aortic Atherosclerosis (ICD10-I70.0) and Emphysema (ICD10-J43.9). Two vessel coronary calcification. Electronically Signed   By: MoIven Finn.D.   On: 02/13/2022 18:55   DG Chest Port 1 View  Result Date: 02/13/2022 CLINICAL DATA:  Tachycardia; AFib.  RVR EXAM: PORTABLE CHEST 1 VIEW COMPARISON:  Chest XR, most recently 09/01/2019. CT chest, 06/22/2021. FINDINGS: Cardiomediastinal silhouette is within normal limits. Lungs are well inflated. Well-corticated RIGHT perihilar opacity. No addition focal consolidation or mass. No pleural effusion or pneumothorax. No acute displaced fracture. IMPRESSION: 1. No acute cardiopulmonary process. 2. Well corticated RIGHT perihilar opacity is new, and potentially superimposition versus external to patient. If continued clinical concern, consider two-view (AP + Lat) chest XRs for further evaluation Electronically Signed   By: JoMichaelle Birks.D.   On: 02/13/2022 16:49    Cardiac Studies:   Review 01/09/2022: Right ABI 1.08 at PT  and [facility] with biphasic  wave[facility]. Left ABI 0.91 at PT level and [facility] with biphasic wave[facility]. Bilateral toe brachial index normal.  EKG:  EKG 02/13/2022 at 2235 hrs.: Normal sinus rhythm at the rate of 58 bpm, normal EKG.  Telemetry strips and EMS strips evaluated in the chart, patient clearly had A-fib with RVR with nonspecific ST-T abnormality, cannot exclude ischemia. This normalized  Assessment   1.  New onset A-fib with RVR 2.  Coronary artery calcification and non-STEMI, could be related to significant underlying CAD versus demand ischemia. 3. Primary hypertension 4.  Hypercholesterolemia 5.  Tobacco use disorder, smokes 1.5 packs of cigarettes a day.  Quit 3 days ago. Recommendations:   Patient's symptoms of chest pain associated with A-fib with RVR, EKG showing significant ST depressions although  transient could indicate underlying significant coronary artery disease.  Options include coronary CTA but in view of coronary calcification may have artifact and stress testing is an option and I discussed this with the patient, I explained to her regarding cardiac catheterization versus medical management and stress testing.  Patient prefers to proceed with cardiac catheterization.  I also feel that in view of her significant elevation in cardiac serum troponins and ST changes on EKG, she has already had a positive stress test.  Patient is presently asymptomatic.  Will address her lipids and primary hypertension and try to get her to quit smoking as well.   Adrian Prows, MD, [facility] 02/14/2022, 9:25 AM Office: [phone]

## 2022-02-14 NOTE — Progress Notes (Signed)
[facility] night cross cover note:   I was notified by RN of this patient, who is admitted overnight for atrial fibrillation with RVR, has subsequently converted to sinus rhythm and is currently exhibiting heart rates in the high 50s to low 60s while on diltiazem drip at rate of 2.5 mg/h.  Normotensive blood pressures.  I subsequently placed order for resumption of home metoprolol tartrate 25 mg p.o. twice daily and then placed order to discontinue diltiazem drip.    Babs Bertin, DO Hospitalist

## 2022-02-14 NOTE — Consult Note (Signed)
CARDIOLOGY CONSULT NOTE  Patient ID: Kristen Wilson MRN: [id number] DOB/AGE: 05/14/1951 71 y.o.  Admit date: 02/13/2022 Referring [place], DO Primary Physician:  [name], MD Reason for Consultation  New onset atrial fibrillation  Patient ID: Kristen Wilson, female    DOB: [birth year], 71 y.o.   MRN: [id number]  Chief Complaint  Patient presents with   Afib RVR   HPI:    Kristen Wilson  is a 71 y.o. Caucasian female with hypertension, hyperlipidemia, GERD, IBS, chronic palpitations and on beta-blocker therapy, who is admitted to the hospital on 02/13/2022 with chest pain, palpitations and lightheadedness and was brought to ED via EMS.  In the field patient was found to have A-fib with RVR, received diltiazem.  She had converted to sinus rhythm in the ambulance however had recurrence of atrial fibrillation and started on IV diltiazem.  She had recurrence of chest discomfort with A-fib with RVR.  We were consulted for management of atrial fibrillation and also elevated high-sensitivity troponins.  Presently states that she is doing well, has not had any further palpitations or chest pain.  Past Medical History:  Diagnosis Date   Allergy    Anginal pain ([place])    hx- non recent - r/t anxiety/panic attack   Anxiety    Arthritis    "Whole body"   Bronchitis    uses inhaler prn   Cough    smoker s cough   Depression    Diverticulitis    GERD (gastroesophageal reflux disease)    Hiatal hernia    Hyperlipidemia    Hypertension    IBS (irritable bowel syndrome)    Osteoporosis    Pneumonia    hx   Primary localized osteoarthritis of left knee    Shortness of breath    occ   Smoker    SVD (spontaneous vaginal delivery)    x 2   Upper respiratory tract infection 06/14/2015   Starting IV Rocephin today also starting nebulizers for wheezing   Past Surgical History:  Procedure Laterality Date   CHOLECYSTECTOMY     COLONOSCOPY N/A 03/20/2013   Procedure:  COLONOSCOPY;  Surgeon: Wonda Horner, MD;  Location: [facility];  Service: Endoscopy;  Laterality: N/A;   CYST EXCISION PERINEAL  03/20/2018   Procedure: INCISION AND DRAINAGE OF PERINEAL CYST;  Surgeon: Brien Few, MD;  Location: [facility];  Service: Gynecology;;   DILATION AND CURETTAGE OF UTERUS     KNEE ARTHROSCOPY     left   TONSILLECTOMY     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   Procedure: LEFT TOTAL KNEE ARTHROPLASTY;  Surgeon: Elsie Saas, MD;  Location: [facility];  Service: Orthopedics;  Laterality: Left;   TUBAL LIGATION     UPPER GI ENDOSCOPY     reflux, hiatal hernia   VULVECTOMY N/A 03/20/2018   Procedure: WIDE EXCISION VULVECTOMY;  Surgeon: Brien Few, MD;  Location: [facility];  Service: Gynecology;  Laterality: N/A;   WISDOM TOOTH EXTRACTION     Social History   Tobacco Use   Smoking status: Every Day    Packs/day: 1.50    Years: 40.00    Total pack years: 60.00    Types: Cigarettes   Smokeless tobacco: Never  Substance Use Topics   Alcohol use: No    Family History  Problem Relation Age of Onset   Cancer Mother        breast   Arthritis Mother    Diabetes Mother  Cancer Father        bladder,multiple myeloma    Marital Status: Widowed  ROS  Review of Systems  Cardiovascular:  Positive for dyspnea on exertion. Negative for chest pain and leg swelling.  Respiratory:  Positive for cough.    Objective      02/14/2022    8:03 AM 02/14/2022    6:30 AM 02/14/2022    6:15 AM  Vitals with BMI  Systolic 136 124 127  Diastolic 65 63 62  Pulse 61 59 62    Blood pressure 136/65, pulse 61, temperature 98.3 F (36.8 C), temperature source Oral, resp. rate 14, height 5' 1" (1.549 m), weight 61.2 kg, SpO2 100 %.  Physical Exam Constitutional:      Appearance: She is obese.  Neck:     Vascular: No JVD.  Cardiovascular:     Rate and Rhythm: Normal rate and regular rhythm.     Pulses:          Dorsalis pedis pulses are 1+ on the right side and 0 on  the left side.       Posterior tibial pulses are 0 on the right side and 0 on the left side.     Heart sounds: Heart sounds are distant. No murmur heard.    No gallop.  Pulmonary:     Effort: Pulmonary effort is normal.     Breath sounds: Normal breath sounds.  Abdominal:     General: Bowel sounds are normal.     Palpations: Abdomen is soft.  Musculoskeletal:     Right lower leg: No edema.     Left lower leg: No edema.    Laboratory examination:   Recent Labs    02/13/22 1615 02/14/22 0530  NA 134* 135  K 3.2* 3.6  CL 97* 100  CO2 28 28  GLUCOSE 156* 122*  BUN 14 15  CREATININE 1.01* 0.79  CALCIUM 8.0* 7.8*  GFRNONAA 60* >60   estimated creatinine clearance is 54.2 mL/min (by C-G formula based on SCr of 0.79 mg/dL).     Latest Ref Rng & Units 02/14/2022    5:30 AM 02/13/2022    4:15 PM 10/31/2020    2:57 PM  CMP  Glucose 70 - 99 mg/dL 122  156  97   BUN 8 - 23 mg/dL 15  14  9   Creatinine 0.44 - 1.00 mg/dL 0.79  1.01  0.79   Sodium 135 - 145 mmol/L 135  134  139   Potassium 3.5 - 5.1 mmol/L 3.6  3.2  3.5   Chloride 98 - 111 mmol/L 100  97  97   CO2 22 - 32 mmol/L 28  28  32   Calcium 8.9 - 10.3 mg/dL 7.8  8.0  9.2   Total Protein 6.5 - 8.1 g/dL 4.2   6.7   Total Bilirubin 0.3 - 1.2 mg/dL 0.7   0.9   Alkaline Phos 38 - 126 U/L 56   75   AST 15 - 41 U/L 25   32   ALT 0 - 44 U/L 30   28       Latest Ref Rng & Units 02/14/2022    5:30 AM 02/13/2022    4:15 PM 10/31/2020    2:57 PM  CBC  WBC 4.0 - 10.5 K/uL 14.7  19.4  6.9   Hemoglobin 12.0 - 15.0 g/dL 14.5  15.6  15.8   Hematocrit 36.0 - 46.0 % 42.4  44.9    46.1   Platelets 150 - 400 K/uL 159  205  164    Lipid Panel Recent Labs    02/14/22 0530  CHOL 78  TRIG 67  LDLCALC 33  VLDL 13  HDL 32*  CHOLHDL 2.4    HEMOGLOBIN A1C Lab Results  Component Value Date   HGBA1C 5.5 02/13/2022   MPG 111.15 02/13/2022   TSH Recent Labs    02/13/22 1615  TSH 0.781   BNP (last 3 results) No results for  input(s): "BNP" in the last 8760 hours.  Cardiac Panel (last 3 results) Recent Labs    02/13/22 1809 02/13/22 2035 02/13/22 2135  TROPONINIHS 114* 362* 492*     Medications and allergies   Allergies  Allergen Reactions   Latex Hives   Nsaids     Aleve / Ibuprofen  - causes Blood Pressure to rise   Percocet [Oxycodone-Acetaminophen] Nausea And Vomiting     Current Meds  Medication Sig   albuterol (PROVENTIL HFA;VENTOLIN HFA) 108 (90 BASE) MCG/ACT inhaler Inhale 2 puffs into the lungs every 6 (six) hours as needed for wheezing or shortness of breath.   ALPRAZolam (XANAX) 0.5 MG tablet Take 0.5 mg by mouth daily as needed for anxiety.   aspirin 81 MG chewable tablet Chew 81 mg by mouth daily.   Cholecalciferol (VITAMIN D3) 2000 units TABS Take 2,000 Units by mouth daily.   Cyanocobalamin (VITAMIN B-12 PO) Take 1 tablet by mouth daily.   docusate sodium (COLACE) 100 MG capsule 1 tab 2 times a day while on narcotics.  STOOL SOFTENER   DULoxetine (CYMBALTA) 60 MG capsule Take 120 mg by mouth 2 (two) times daily.   hydrochlorothiazide (HYDRODIURIL) 25 MG tablet Take 25 mg by mouth daily.    irbesartan (AVAPRO) 300 MG tablet Take 300 mg by mouth daily.   metoprolol (LOPRESSOR) 50 MG tablet Take 25 mg by mouth 2 (two) times daily.   Multiple Vitamin (MULTIVITAMIN) tablet Take 1 tablet by mouth daily.   Omega-3 Fatty Acids (FISH OIL) 1000 MG CAPS Take 1,000 mg by mouth daily.   pantoprazole (PROTONIX) 40 MG tablet Take 40 mg by mouth daily.    polyethylene glycol (MIRALAX / GLYCOLAX) packet 17grams in 16 oz of water twice a day until bowel movement.  LAXITIVE.  Restart if two days since last bowel movement (Patient taking differently: 17grams in 16 oz of water twice a day until bowel movement.  LAXITIVE.  Restart if two days since last bowel movement)   Potassium 99 MG TABS Take 1 tablet by mouth daily.   rosuvastatin (CRESTOR) 20 MG tablet Take 20 mg by mouth daily.    Scheduled  Meds:  [MAR Hold] aspirin  81 mg Oral Daily   [MAR Hold] DULoxetine  120 mg Oral BID   [MAR Hold] metoprolol tartrate  25 mg Oral BID   [MAR Hold] nicotine  21 mg Transdermal Daily   [MAR Hold] pantoprazole  40 mg Oral Daily   [MAR Hold] rosuvastatin  20 mg Oral Daily   sodium chloride flush  3 mL Intravenous Q12H   [MAR Hold] vitamin B-12  500 mcg Oral Daily   Continuous Infusions:  sodium chloride     sodium chloride     Followed by   sodium chloride     heparin Stopped (02/14/22 0912)   lactated ringers 125 mL/hr at 02/14/22 0142   PRN Meds:.sodium chloride, [MAR Hold] acetaminophen, [MAR Hold] ALPRAZolam, Heparin (Porcine) in NaCl, [MAR Hold] melatonin, [MAR  Hold] oxyCODONE, [MAR Hold] polyethylene glycol, [MAR Hold] prochlorperazine, sodium chloride flush   I/O last 3 completed shifts: In: 78 [IV Piggyback:50] Out: -  No intake/output data recorded.    Radiology:   CT Angio Chest PE W and/or Wo Contrast  Result Date: 02/13/2022 CLINICAL DATA:  Pulmonary embolism (PE) suspected, high prob. palpitations. EMS reports HR was 190s with afib RVR on the monitor. Cardizem 23m IVP and 350cc NS. Alert and oriented x 4. EXAM: CT ANGIOGRAPHY CHEST WITH CONTRAST TECHNIQUE: Multidetector CT imaging of the chest was performed using the standard protocol during bolus administration of intravenous contrast. Multiplanar CT image reconstructions and MIPs were obtained to evaluate the vascular anatomy. RADIATION DOSE REDUCTION: This exam was performed according to the departmental dose-optimization program which includes automated exposure control, adjustment of the mA and/or kV according to patient size and/or use of iterative reconstruction technique. CONTRAST:  1017mOMNIPAQUE IOHEXOL 350 MG/ML SOLN COMPARISON:  None Available. FINDINGS: Cardiovascular: Satisfactory opacification of the pulmonary arteries to the segmental level. No evidence of pulmonary embolism. The main pulmonary artery is  normal in caliber. Normal heart size. No significant pericardial effusion. The thoracic aorta is normal in caliber. No atherosclerotic plaque of the thoracic aorta. At least 2 vessel coronary artery calcifications. Mediastinum/Nodes: No enlarged mediastinal, hilar, or axillary lymph nodes. Thyroid gland, trachea, and esophagus demonstrate no significant findings. Lungs/Pleura: Mild centrilobular emphysematous changes. No focal liver abnormality. No gallstones, gallbladder wall thickening, or pericholecystic fluid. No biliary dilatation. Upper Abdomen: Status post cholecystectomy.  No acute abnormality. Musculoskeletal: No chest wall abnormality. No suspicious lytic or blastic osseous lesions. No acute displaced fracture. Multilevel degenerative changes of the spine. Review of the MIP images confirms the above findings. IMPRESSION: 1. No pulmonary embolus. 2. No acute intrathoracic abnormality. 3. Aortic Atherosclerosis (ICD10-I70.0) and Emphysema (ICD10-J43.9). Two vessel coronary calcification. Electronically Signed   By: MoIven Finn.D.   On: 02/13/2022 18:55   DG Chest Port 1 View  Result Date: 02/13/2022 CLINICAL DATA:  Tachycardia; AFib.  RVR EXAM: PORTABLE CHEST 1 VIEW COMPARISON:  Chest XR, most recently 09/01/2019. CT chest, 06/22/2021. FINDINGS: Cardiomediastinal silhouette is within normal limits. Lungs are well inflated. Well-corticated RIGHT perihilar opacity. No addition focal consolidation or mass. No pleural effusion or pneumothorax. No acute displaced fracture. IMPRESSION: 1. No acute cardiopulmonary process. 2. Well corticated RIGHT perihilar opacity is new, and potentially superimposition versus external to patient. If continued clinical concern, consider two-view (AP + Lat) chest XRs for further evaluation Electronically Signed   By: JoMichaelle Birks.D.   On: 02/13/2022 16:49    Cardiac Studies:   Review 01/09/2022: Right ABI 1.08 at PT  [facility] with biphasic  wave[facility]. Left ABI 0.91 at PT level and [facility] with biphasic waveforms. Bilateral toe brachial index normal.  EKG:  EKG 02/13/2022 at 2235 hrs.: Normal sinus rhythm at the rate of 58 bpm, normal EKG.  Telemetry strips and EMS strips evaluated in the chart, patient clearly had A-fib with RVR with nonspecific ST-T abnormality, cannot exclude ischemia. This normalized  Assessment   1.  New onset A-fib with RVR 2.  Coronary artery calcification and non-STEMI, could be related to significant underlying CAD versus demand ischemia. 3. Primary hypertension 4.  Hypercholesterolemia 5.  Tobacco use disorder, smokes 1.5 packs of cigarettes a day.  Quit 3 days ago. Recommendations:   Patient's symptoms of chest pain associated with A-fib with RVR, EKG showing significant ST depressions although  transient could indicate underlying significant coronary artery disease.  Options include coronary CTA but in view of coronary calcification may have artifact and stress testing is an option and I discussed this with the patient, I explained to her regarding cardiac catheterization versus medical management and stress testing.  Patient prefers to proceed with cardiac catheterization.  I also feel that in view of her significant elevation in cardiac serum troponins and ST changes on EKG, she has already had a positive stress test.  Patient is presently asymptomatic.  Will address her lipids and primary hypertension and try to get her to quit smoking as well.   Oma Marzan, MD, FACC 02/14/2022, 9:25 AM Office: [phone]   

## 2022-02-14 NOTE — Progress Notes (Signed)
Cath lab staff x 2 in to reposition zephry band per Dr Einar Gip orders

## 2022-02-14 NOTE — Progress Notes (Signed)
Dr Einar Gip in to see pt

## 2022-02-14 NOTE — Progress Notes (Signed)
PROGRESS NOTE  Kristen Wilson  PPJ:[id number] DOB: 12/31/1950 DOA: 02/13/2022 PCP: [name], MD   Brief Narrative:  Patient is a 81 old female with history of hypertension, hyperlipidemia, GERD,COPD, tobacco use, recently treated for bronchitis finishing the course of antibiotics and steroids who presented with complaints of sudden onset of palpitations, left-sided chest tightness.  Upon presentation, she was found to be in A-fib with RVR with heart rate in the range of 150s to 190s.  Patient was started on Cardizem drip and was admitted for the management of new onset A-fib with RVR.  Cardiology consulted, started on heparin drip.  Cardiology planning for cardiac cath today.  Got report that she will be discharged from Cath Lab  Assessment & Plan:  Principal Problem:   New onset a-fib ([place]) Active Problems:   Hypertension   Elevated troponin   New onset A-fib with RVR: Presented with palpitations, left chest tightness.  Patient was started on Cardizem drip with control of the heart rate and spontaneous conversion to normal sinus rhythm.  Also started on heparin drip. Cardiology following.  TSH normal. Takes metoprolol at home, being continued CHA2DS2-VASc score 3.  Management as per cardiology.  Elevated troponin: Associated with left-sided chest pain but most likely this is secondary to supply demand ischemia in the setting of A-fib with RVR.  No evidence of ischemia on twelve-lead EKG. underwent cardiac cath today with finding of EF of 55 to 60% ,no significant coronary disease.  Plan for medical management.  Management as per cardiology ,she will follow-up with cardiology as an outpatient.  Hypokalemia/hypomagnesemia: Supplemented and corrected  Hyponatremia: Currently stable.  Takes hydrochlorothiazide at home.  Euvolemic  Hypertension: Currently blood pressure stable.  Restarted home metoprolol  GERD: Continue PPI  Hyperlipidemia: On Crestor  Leukocytosis: Most likely  reactive.  Improved  Anxiety/depression: Resume home medications  Tobacco use: Was smoking  about a week ago.  Encouraged for cessation.  Nicotine patch ordered            DVT prophylaxis:Heparin IV     Code Status: Full Code  Family Communication: None at the bedside  Patient status: Inpatient  Patient is from : Home  Anticipated discharge to: Home  Estimated DC date: As per cardiology, cardiology doing the discharge summary   Consultants: Cardiology  Procedures: Cardiac cath  Antimicrobials:  Anti-infectives (From admission, onward)    None       Subjective: Patient was seen and examined at the bedside this morning in the emergency department.  During my evaluation, she was hemodynamically stable.  Comfortable without any chest pain.  She has converted to normal sinus rhythm.  She was eager to go home  Objective: Vitals:   02/14/22 0545 02/14/22 0600 02/14/22 0615 02/14/22 0630  BP: 139/65 135/62 127/62 124/63  Pulse: (!) 58 (!) 58 62 (!) 59  Resp: '15 14 11 16  '$ Temp:      TempSrc:      SpO2: 99% 99% 100% 98%  Weight:      Height:        Intake/Output Summary (Last 24 hours) at 02/14/2022 0800 Last data filed at 02/13/2022 1853 Gross per 24 hour  Intake 50 ml  Output --  Net 50 ml   Filed Weights   02/13/22 1616  Weight: 61.2 kg    Examination:  General exam: Overall comfortable, not in distress HEENT: PERRL Respiratory system:  no wheezes or crackles  Cardiovascular system: S1 & S2 heard, RRR.  Gastrointestinal  system: Abdomen is nondistended, soft and nontender. Central nervous system: Alert and oriented Extremities: No edema, no clubbing ,no cyanosis Skin: No rashes, no ulcers,no icterus     Data Reviewed: I have personally reviewed following labs and imaging studies  CBC: Recent Labs  Lab 02/13/22 1615 02/14/22 0530  WBC 19.4* 14.7*  NEUTROABS  --  9.8*  HGB 15.6* 14.5  HCT 44.9 42.4  MCV 92.0 93.4  PLT 205 937   Basic  Metabolic Panel: Recent Labs  Lab 02/13/22 1615 02/14/22 0530  NA 134* 135  K 3.2* 3.6  CL 97* 100  CO2 28 28  GLUCOSE 156* 122*  BUN 14 15  CREATININE 1.01* 0.79  CALCIUM 8.0* 7.8*  MG 1.5* 1.9  PHOS  --  2.6     No results found for this or any previous visit (from the past 240 hour(s)).   Radiology Studies: CT Angio Chest PE W and/or Wo Contrast  Result Date: 02/13/2022 CLINICAL DATA:  Pulmonary embolism (PE) suspected, high prob. palpitations. EMS reports HR was 190s with afib RVR on the monitor. Cardizem '10mg'$  IVP and 350cc NS. Alert and oriented x 4. EXAM: CT ANGIOGRAPHY CHEST WITH CONTRAST TECHNIQUE: Multidetector CT imaging of the chest was performed using the standard protocol during bolus administration of intravenous contrast. Multiplanar CT image reconstructions and MIPs were obtained to evaluate the vascular anatomy. RADIATION DOSE REDUCTION: This exam was performed according to the departmental dose-optimization program which includes automated exposure control, adjustment of the mA and/or kV according to patient size and/or use of iterative reconstruction technique. CONTRAST:  167m OMNIPAQUE IOHEXOL 350 MG/ML SOLN COMPARISON:  None Available. FINDINGS: Cardiovascular: Satisfactory opacification of the pulmonary arteries to the segmental level. No evidence of pulmonary embolism. The main pulmonary artery is normal in caliber. Normal heart size. No significant pericardial effusion. The thoracic aorta is normal in caliber. No atherosclerotic plaque of the thoracic aorta. At least 2 vessel coronary artery calcifications. Mediastinum/Nodes: No enlarged mediastinal, hilar, or axillary lymph nodes. Thyroid gland, trachea, and esophagus demonstrate no significant findings. Lungs/Pleura: Mild centrilobular emphysematous changes. No focal liver abnormality. No gallstones, gallbladder wall thickening, or pericholecystic fluid. No biliary dilatation. Upper Abdomen: Status post  cholecystectomy.  No acute abnormality. Musculoskeletal: No chest wall abnormality. No suspicious lytic or blastic osseous lesions. No acute displaced fracture. Multilevel degenerative changes of the spine. Review of the MIP images confirms the above findings. IMPRESSION: 1. No pulmonary embolus. 2. No acute intrathoracic abnormality. 3. Aortic Atherosclerosis (ICD10-I70.0) and Emphysema (ICD10-J43.9). Two vessel coronary calcification. Electronically Signed   By: MIven FinnM.D.   On: 02/13/2022 18:55   DG Chest Port 1 View  Result Date: 02/13/2022 CLINICAL DATA:  Tachycardia; AFib.  RVR EXAM: PORTABLE CHEST 1 VIEW COMPARISON:  Chest XR, most recently 09/01/2019. CT chest, 06/22/2021. FINDINGS: Cardiomediastinal silhouette is within normal limits. Lungs are well inflated. Well-corticated RIGHT perihilar opacity. No addition focal consolidation or mass. No pleural effusion or pneumothorax. No acute displaced fracture. IMPRESSION: 1. No acute cardiopulmonary process. 2. Well corticated RIGHT perihilar opacity is new, and potentially superimposition versus external to patient. If continued clinical concern, consider two-view (AP + Lat) chest XRs for further evaluation Electronically Signed   By: JMichaelle BirksM.D.   On: 02/13/2022 16:49    Scheduled Meds:  aspirin  81 mg Oral Daily   DULoxetine  120 mg Oral BID   metoprolol tartrate  25 mg Oral BID   nicotine  21 mg Transdermal Daily  pantoprazole  40 mg Oral Daily   rosuvastatin  20 mg Oral Daily   vitamin B-12  500 mcg Oral Daily   Continuous Infusions:  heparin 900 Units/hr (02/14/22 0558)   lactated ringers 125 mL/hr at 02/14/22 0142     LOS: 1 day   Shelly Coss, MD [facility] P6/21/2023, 8:00 AM

## 2022-02-14 NOTE — Progress Notes (Signed)
ANTICOAGULATION CONSULT NOTE   Pharmacy Consult for heparin Indication: atrial fibrillation  Allergies  Allergen Reactions   Latex Hives   Nsaids     Aleve / Ibuprofen  - causes Blood Pressure to rise   Percocet [Oxycodone-Acetaminophen] Nausea And Vomiting    Patient Measurements: Height: '5\' 1"'$  (154.9 cm) Weight: 61.2 kg (135 lb) IBW/kg (Calculated) : 47.8 Heparin Dosing Weight: 60.2kg  Vital Signs: BP: 136/65 (06/21 0803) Pulse Rate: 61 (06/21 0803)  Labs: Recent Labs    02/13/22 1615 02/13/22 1809 02/13/22 2035 02/13/22 2135 02/14/22 0530  HGB 15.6*  --   --   --  14.5  HCT 44.9  --   --   --  42.4  PLT 205  --   --   --  159  HEPARINUNFRC  --   --   --   --  0.54  CREATININE 1.01*  --   --   --  0.79  TROPONINIHS 12 114* 362* 492*  --      Estimated Creatinine Clearance: 54.2 mL/min (by C-G formula based on SCr of 0.79 mg/dL).   Medical History: Past Medical History:  Diagnosis Date   Allergy    Anginal pain ([place])    hx- non recent - r/t anxiety/panic attack   Anxiety    Arthritis    "Whole body"   Bronchitis    uses inhaler prn   Cough    smoker s cough   Depression    Diverticulitis    GERD (gastroesophageal reflux disease)    Hiatal hernia    Hyperlipidemia    Hypertension    IBS (irritable bowel syndrome)    Osteoporosis    Pneumonia    hx   Primary localized osteoarthritis of left knee    Shortness of breath    occ   Smoker    SVD (spontaneous vaginal delivery)    x 2   Upper respiratory tract infection 06/14/2015   Starting IV Rocephin today also starting nebulizers for wheezing    Assessment: 14 YOF presenting with palpitations, in afib, she is not on anticoagulation PTA. Pharmacy consulted for heparin dosing.   Heparin level therapeutic at 0.54 with heparin running at 900 units/h. CBC stable/WNL. No signs of bleeding or issues with infusion noted.   Goal of Therapy:  Heparin level 0.3-0.7 units/ml Monitor platelets by  anticoagulation protocol: Yes   Plan:  Continue heparin gtt at 900 units/hr Daily heparin level/CBC Monitor for signs and symptoms of bleeding F/U anticoag plans post cath   Adria Dill, PharmD PGY-1 Acute Care Resident  02/14/2022 8:31 AM

## 2022-02-14 NOTE — Telephone Encounter (Signed)
Patient discharged from hospital 02/15/2022. Please call and do TOC call tomorrow.

## 2022-02-14 NOTE — Discharge Summary (Signed)
Physician Discharge Summary  Patient ID: Kristen Wilson MRN: [id number] DOB/AGE: [birth year] 72 y.o. Kristen Caraway, MD   Admit date: 02/13/2022 Discharge date: 02/14/2022  Primary Discharge Diagnosis 1.  Atrial fibrillation with rapid ventricular response with demand ischemia and type II MI CHA2DS2-VASc Score is 4.  Yearly risk of stroke: 4.8% (F, A, HTN, Vasc Dz).  Score of 1=0.6; 2=2.2; 3=3.2; 4=4.8; 5=7.2; 6=9.8; 7=>9.8) -(CHF; HTN; vasc disease DM,  Female = 1; Age <65 =0; 65-74 = 1,  >75 =2; stroke/embolism= 2).   2.  Primary hypertension 3.  Hypercholesterolemia 4.  Tobacco use disorder and centrilobular emphysema 5.  Moderate coronary artery disease and coronary calcification.   Significant Diagnostic Studies:  Left Heart Catheterization 02/14/22:  LV: 132/6, EDP 19 mmHg.  Ao 164/71, mean 108 mmHg.  No pressure gradient across the aortic valve. LVEF normal at 55 to 60%.  No significant mitral regurgitation. LM: Large vessel, mild calcification evident. LAD: Large vessel, moderate diffuse coronary calcifications noted throughout the proximal and mid segment of the LAD.  There is a diffuse 30% calcific stenosis in the mid LAD after the origin of septal perforator 1.  D1 is large and has a ostial 50% stenosis.  Otherwise the LAD has mild diffuse disease distally. CX: Large vessel, has mild to moderate amount of coronary calcification in the proximal and mid segment.  There is mild diffuse disease, small AV groove circumflex, large OM1 with mild disease. RCA: Large-caliber vessel.  Again mild proximal coronary calcification is evident.  Mild luminal irregularities present.  Impression: The troponin elevation is related to demand ischemia and in the absence of high-grade coronary stenosis, normal LVEF and normal EKG, do not suspect non-ST elevation myocardial infarction.  EKG:   EKG 02/13/2022 at 2235 hrs.: Normal sinus rhythm at the rate of 58 bpm, normal EKG.   Telemetry strips  and EMS strips evaluated in the chart, patient clearly had A-fib with RVR with nonspecific ST-T abnormality, cannot exclude ischemia. This normalized   Radiology:  DG Chest Port 1 Views 02/13/2022 1. No acute cardiopulmonary process. 2. Well corticated RIGHT perihilar opacity is new, and potentially superimposition versus external to patient. If continued clinical concern, consider two-view (AP + Lat) chest XRs for further evaluation  CT Angio Chest PE W and/or Wo Contrast 02/13/2022 No evidence of pulmonary embolism. The main pulmonary artery is normal in caliber. Normal heart size. No significant pericardial effusion. The thoracic aorta is normal in caliber. No atherosclerotic plaque of the thoracic aorta. At least 2 vessel coronary artery calcifications.  Lungs/Pleura: Mild centrilobular emphysematous changes.  Hospital Course: Kristen Wilson is a 71 y.o. female  patient Caucasian female with hypertension, hyperlipidemia, GERD, IBS, chronic palpitations and on beta-blocker therapy, who is admitted to the hospital on 02/13/2022 with chest pain, palpitations and lightheadedness and was brought to ED via EMS.  In the field patient was found to have A-fib with RVR, received diltiazem.  She had converted to sinus rhythm in the ambulance however had recurrence of atrial fibrillation and started on IV diltiazem.  She had recurrence of chest discomfort with A-fib with RVR.  We were consulted for management of atrial fibrillation and also elevated high-sensitivity troponins.  After discussions regarding risks and benefits, we decided to proceed directly with cardiac catheterization.  Coronary angiography fortunately revealed only moderate disease.  It was felt that her symptoms of chest pain was related to atrial fibrillation with rapid ventricular response and demand ischemia.  As she had  converted to sinus rhythm, blood pressure was well controlled, felt stable for discharge.  Recommendations on  discharge: She was discharged home on Eliquis for long-term anticoagulation in view of high chads vascular score.  Aspirin was discontinued as I do not suspect ACS/unstable angina pectoris and she only has moderate disease in the coronaries.  This is also to reduce risk of bleed.  Smoking cessation has extensively been discussed with the patient.  She appears motivated.  She would be a good candidate for OCEANIC-AF (Asundexian - factor XIa inhibitor PO BID vs Apixaban PO BID in patients with A. Fib for stroke prevention.   Discharge Exam:    02/14/2022    1:00 PM 02/14/2022   12:00 PM 02/14/2022   11:30 AM  Vitals with BMI  Systolic 854 627 035  Diastolic 73 81 009  Pulse 63 62 65     Physical Exam Neck:     Vascular: No JVD.  Cardiovascular:     Rate and Rhythm: Normal rate and regular rhythm.     Pulses: Intact distal pulses.          Dorsalis pedis pulses are 1+ on the right side and 1+ on the left side.       Posterior tibial pulses are 0 on the right side and 0 on the left side.     Heart sounds: Normal heart sounds. No murmur heard.    No gallop.  Pulmonary:     Effort: Pulmonary effort is normal.     Breath sounds: Normal breath sounds.  Abdominal:     General: Bowel sounds are normal.     Palpations: Abdomen is soft.  Musculoskeletal:     Right lower leg: No edema.     Left lower leg: No edema.    Labs:   Lab Results  Component Value Date   WBC 14.7 (H) 02/14/2022   HGB 14.5 02/14/2022   HCT 42.4 02/14/2022   MCV 93.4 02/14/2022   PLT 159 02/14/2022    Recent Labs  Lab 02/14/22 0530  NA 135  K 3.6  CL 100  CO2 28  BUN 15  CREATININE 0.79  CALCIUM 7.8*  PROT 4.2*  BILITOT 0.7  ALKPHOS 56  ALT 30  AST 25  GLUCOSE 122*    Lipid Panel     Component Value Date/Time   CHOL 78 02/14/2022 0530   TRIG 67 02/14/2022 0530   HDL 32 (L) 02/14/2022 0530   CHOLHDL 2.4 02/14/2022 0530   VLDL 13 02/14/2022 0530   LDLCALC 33 02/14/2022 0530    BNP  (last 3 results) No results for input(s): "BNP" in the last 8760 hours.  HEMOGLOBIN A1C Lab Results  Component Value Date   HGBA1C 5.5 02/13/2022   MPG 111.15 02/13/2022    Cardiac Panel (last 3 results) Recent Labs    02/13/22 1809 02/13/22 2035 02/13/22 2135  TROPONINIHS 114* 362* 492*     TSH Recent Labs    02/13/22 1615  TSH 0.781    FOLLOW UP PLANS AND APPOINTMENTS Discharge Instructions     Amb referral to AF[facility] Clinic   Complete by: As directed       Allergies as of 02/14/2022       Reactions   Latex Hives   Nsaids    Aleve / Ibuprofen  - causes Blood Pressure to rise   Percocet [oxycodone-acetaminophen] Nausea And Vomiting        Medication List     STOP taking  these medications    aspirin 81 MG chewable tablet   meloxicam 15 MG tablet Commonly known as: MOBIC   neomycin-polymyxin b-dexamethasone 3.5-10000-0.1 Oint Commonly known as: MAXITROL       TAKE these medications    albuterol 108 (90 Base) MCG/ACT inhaler Commonly known as: VENTOLIN HFA Inhale 2 puffs into the lungs every 6 (six) hours as needed for wheezing or shortness of breath.   ALPRAZolam 0.5 MG tablet Commonly known as: XANAX Take 0.5 mg by mouth daily as needed for anxiety.   docusate sodium 100 MG capsule Commonly known as: COLACE 1 tab 2 times a day while on narcotics.  STOOL SOFTENER   DULoxetine 60 MG capsule Commonly known as: CYMBALTA Take 120 mg by mouth 2 (two) times daily. What changed: Another medication with the same name was removed. Continue taking this medication, and follow the directions you see here.   Eliquis 5 MG Tabs tablet Generic drug: apixaban Take 1 tablet (5 mg total) by mouth 2 (two) times daily.   Fish Oil 1000 MG Caps Take 1,000 mg by mouth daily.   hydrochlorothiazide 25 MG tablet Commonly known as: HYDRODIURIL Take 25 mg by mouth daily.   irbesartan 300 MG tablet Commonly known as: AVAPRO Take 300 mg by mouth daily.    metoprolol tartrate 50 MG tablet Commonly known as: LOPRESSOR Take 1 tablet (50 mg total) by mouth 2 (two) times daily. What changed: how much to take   multivitamin tablet Take 1 tablet by mouth daily.   pantoprazole 40 MG tablet Commonly known as: PROTONIX Take 40 mg by mouth daily.   polyethylene glycol 17 g packet Commonly known as: MIRALAX / GLYCOLAX 17grams in 16 oz of water twice a day until bowel movement.  LAXITIVE.  Restart if two days since last bowel movement   Potassium 99 MG Tabs Take 1 tablet by mouth daily.   rosuvastatin 20 MG tablet Commonly known as: CRESTOR Take 20 mg by mouth daily.   VITAMIN B-12 PO Take 1 tablet by mouth daily.   Vitamin D3 50 MCG (2000 UT) Tabs Take 2,000 Units by mouth daily.        Follow-up Information     Alethia Berthold, PA-C Follow up on 02/22/2022.   Specialty: Cardiology Why: 10 AM. Bring all medicatinos. Contact information: [place] [address] [phone]                  Adrian Prows, MD, [facility] 02/14/2022, 6:24 PM Office: [phone]

## 2022-02-15 ENCOUNTER — Telehealth: Payer: Self-pay

## 2022-02-15 NOTE — Telephone Encounter (Signed)
Location of hospitalization: [place] Reason for hospitalization: Atrial fibrillation  Date of discharge:02/14/2022 Date of first communication with patient: today Person contacting patient: Michail Sermon  Current symptoms: None Do you understand why you were in the Hospital: Yes Questions regarding discharge instructions: None Where were you discharged to: Home Medications reviewed: Yes Allergies reviewed: Yes Dietary changes reviewed: Yes. Discussed low fat and low salt diet.  Referals reviewed: NA Activities of Daily Living: Able to with mild limitations Any transportation issues/concerns: None Any patient concerns: None Confirmed importance & date/time of Follow up appt: Yes Confirmed with patient if condition begins to worsen call. Pt was given the office number and encouraged to call back with questions or concerns: Yes

## 2022-02-15 NOTE — Telephone Encounter (Signed)
TOC Done

## 2022-02-20 NOTE — Progress Notes (Signed)
Primary Physician/Referring:  [name], MD  Patient ID: Kristen Wilson, female    DOB: [birth year], 71 y.o.   MRN: [id number]  Chief Complaint  Patient presents with  . New Patient (Initial Visit)  . Hospitalization Follow-up  . Atrial Fibrillation  . Chest Pain   HPI:    Kristen Wilson  is a 71 y.o. Caucasian female with hypertension, hyperlipidemia, GERD, IBS, chronic palpitations and on beta-blocker therapy, admitted 02/13/22 with a fib with RVR. Further evaluation during admission revealed elevated troponin, she therefore underwent left heart catheterization with revealed only moderate non-obstructive CAD. Patient converted to normal sinus rhythm and discharged on Eliquis and Lopressor, as well as HCTZ, irbesartan, and rosuvastatin.   Patient now presents for hospital follow up. Patient has had not recurrence of palpitations. Denies chest pain, dizziness, dyspnea. She quite smoking about 2 weeks ago, and is congratulated on these efforts. Patient does have a history of hemorrhoids  which she states appear to be bleeding more frequently since discharge. She has mistakenly discontinued HCTZ and irbesartan since discharge. She admits to snoring. Denies claudication symptoms.   Past Medical History:  Diagnosis Date  . Allergy   . Anginal pain (HCC)    hx- non recent - r/t anxiety/panic attack  . Anxiety   . Arthritis    "Whole body"  . Bronchitis    uses inhaler prn  . Cough    smoker s cough  . Depression   . Diverticulitis   . GERD (gastroesophageal reflux disease)   . Hiatal hernia   . Hyperlipidemia   . Hypertension   . IBS (irritable bowel syndrome)   . Osteoporosis   . Pneumonia    hx  . Primary localized osteoarthritis of left knee   . Shortness of breath    occ  . Smoker   . SVD (spontaneous vaginal delivery)    x 2  . Upper respiratory tract infection 06/14/2015   Starting IV Rocephin today also starting nebulizers for wheezing   Past Surgical History:   Procedure Laterality Date  . CHOLECYSTECTOMY    . COLONOSCOPY N/A 03/20/2013   Procedure: COLONOSCOPY;  Surgeon: Wonda Horner, MD;  Location: [facility];  Service: Endoscopy;  Laterality: N/A;  . CYST EXCISION PERINEAL  03/20/2018   Procedure: INCISION AND DRAINAGE OF PERINEAL CYST;  Surgeon: Brien Few, MD;  Location: [place] [facility];  Service: Gynecology;;  . DILATION AND CURETTAGE OF UTERUS    . KNEE ARTHROSCOPY     left  . LEFT HEART CATH AND CORONARY ANGIOGRAPHY N/A 02/14/2022   Procedure: LEFT HEART CATH AND CORONARY ANGIOGRAPHY;  Surgeon: Adrian Prows, MD;  Location: [place] [facility];  Service: Cardiovascular;  Laterality: N/A;  . TONSILLECTOMY    . 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   Procedure: LEFT TOTAL KNEE ARTHROPLASTY;  Surgeon: Elsie Saas, MD;  Location: [facility];  Service: Orthopedics;  Laterality: Left;  . TUBAL LIGATION    . UPPER GI ENDOSCOPY     reflux, hiatal hernia  . VULVECTOMY N/A 03/20/2018   Procedure: WIDE EXCISION VULVECTOMY;  Surgeon: Brien Few, MD;  Location: [place] [facility];  Service: Gynecology;  Laterality: N/A;  . WISDOM TOOTH EXTRACTION     Family History  Problem Relation Age of Onset  . Cancer Mother        breast  . Arthritis Mother   . Diabetes Mother   . Cancer Father        bladder,multiple myeloma  Social History   Tobacco Use  . Smoking status: Former    Packs/day: 1.50    Years: 40.00    Total pack years: 60.00    Types: Cigarettes    Quit date: 02/08/2022    Years since quitting: 0.0  . Smokeless tobacco: Never  Substance Use Topics  . Alcohol use: No   Marital Status: Widowed   ROS  Review of Systems  Cardiovascular:  Negative for chest pain, claudication, dyspnea on exertion, leg swelling, near-syncope, orthopnea, palpitations, paroxysmal nocturnal dyspnea and syncope.  Neurological:  Negative for dizziness.    Objective  Blood pressure (!) 170/84, pulse 61, temperature 98.3 F (36.8 C), temperature source Temporal,  resp. rate 16, height 5' 1"  (1.549 m), weight 140 lb 12.8 oz (63.9 kg), SpO2 100 %.     02/22/2022    9:56 AM 02/22/2022    9:37 AM 02/14/2022    1:00 PM  Vitals with BMI  Height  5' 1"    Weight  140 lbs 13 oz   BMI  50.35   Systolic 465 681 275  Diastolic 84 85 73  Pulse 61 62 63      Physical Exam Vitals reviewed.  HENT:     Head: Normocephalic and atraumatic.  Cardiovascular:     Rate and Rhythm: Normal rate and regular rhythm.     Pulses: Intact distal pulses.          Carotid pulses are 2+ on the right side and 2+ on the left side.      Radial pulses are 2+ on the right side and 2+ on the left side.       Popliteal pulses are 2+ on the right side and 2+ on the left side.       Dorsalis pedis pulses are 0 on the right side and 0 on the left side.       Posterior tibial pulses are 0 on the right side and 0 on the left side.     Heart sounds: S1 normal and S2 normal. No murmur heard.    No gallop.     Comments: Diffuse echymosis of right forearm. No evidence of hematoma or bruit. Radial pulse in tact.  Pulmonary:     Effort: Pulmonary effort is normal. No respiratory distress.     Breath sounds: No wheezing, rhonchi or rales.  Musculoskeletal:     Right lower leg: No edema.     Left lower leg: No edema.  Neurological:     Mental Status: She is alert.    Laboratory examination:   Recent Labs    02/13/22 1615 02/14/22 0530  NA 134* 135  K 3.2* 3.6  CL 97* 100  CO2 28 28  GLUCOSE 156* 122*  BUN 14 15  CREATININE 1.01* 0.79  CALCIUM 8.0* 7.8*  GFRNONAA 60* >60   estimated creatinine clearance is 55.2 mL/min (by C-G formula based on SCr of 0.79 mg/dL).     Latest Ref Rng & Units 02/14/2022    5:30 AM 02/13/2022    4:15 PM 10/31/2020    2:57 PM  CMP  Glucose 70 - 99 mg/dL 122  156  97   BUN 8 - 23 mg/dL 15  14  9    Creatinine 0.44 - 1.00 mg/dL 0.79  1.01  0.79   Sodium 135 - 145 mmol/L 135  134  139   Potassium 3.5 - 5.1 mmol/L 3.6  3.2  3.5   Chloride 98 -  111 mmol/L 100  97  97   CO2 22 - 32 mmol/L 28  28  32   Calcium 8.9 - 10.3 mg/dL 7.8  8.0  9.2   Total Protein 6.5 - 8.1 g/dL 4.2   6.7   Total Bilirubin 0.3 - 1.2 mg/dL 0.7   0.9   Alkaline Phos 38 - 126 U/L 56   75   AST 15 - 41 U/L 25   32   ALT 0 - 44 U/L 30   28       Latest Ref Rng & Units 02/14/2022    5:30 AM 02/13/2022    4:15 PM 10/31/2020    2:57 PM  CBC  WBC 4.0 - 10.5 K/uL 14.7  19.4  6.9   Hemoglobin 12.0 - 15.0 g/dL 14.5  15.6  15.8   Hematocrit 36.0 - 46.0 % 42.4  44.9  46.1   Platelets 150 - 400 K/uL 159  205  164     Lipid Panel Recent Labs    02/14/22 0530  CHOL 78  TRIG 67  LDLCALC 33  VLDL 13  HDL 32*  CHOLHDL 2.4    HEMOGLOBIN A1C Lab Results  Component Value Date   HGBA1C 5.5 02/13/2022   MPG 111.15 02/13/2022   TSH Recent Labs    02/13/22 1615  TSH 0.781    External labs:   Allergies   Allergies  Allergen Reactions  . Latex Hives  . Nsaids     Aleve / Ibuprofen  - causes Blood Pressure to rise  . Percocet [Oxycodone-Acetaminophen] Nausea And Vomiting    Medications Prior to Visit:   Outpatient Medications Prior to Visit  Medication Sig Dispense Refill  . albuterol (PROVENTIL HFA;VENTOLIN HFA) 108 (90 BASE) MCG/ACT inhaler Inhale 2 puffs into the lungs every 6 (six) hours as needed for wheezing or shortness of breath. 1 Inhaler 2  . ALPRAZolam (XANAX) 0.5 MG tablet Take 0.5 mg by mouth daily as needed for anxiety.    Marland Kitchen apixaban (ELIQUIS) 5 MG TABS tablet Take 1 tablet (5 mg total) by mouth 2 (two) times daily. 60 tablet 0  . Cholecalciferol (VITAMIN D3) 2000 units TABS Take 2,000 Units by mouth daily.    . Cyanocobalamin (VITAMIN B-12 PO) Take 1 tablet by mouth daily.    . DULoxetine (CYMBALTA) 60 MG capsule Take 120 mg by mouth 2 (two) times daily.    . metoprolol tartrate (LOPRESSOR) 50 MG tablet Take 1 tablet (50 mg total) by mouth 2 (two) times daily. 60 tablet 1  . pantoprazole (PROTONIX) 40 MG tablet Take 40 mg by mouth  daily.     . rosuvastatin (CRESTOR) 20 MG tablet Take 20 mg by mouth daily.    Marland Kitchen docusate sodium (COLACE) 100 MG capsule 1 tab 2 times a day while on narcotics.  STOOL SOFTENER (Patient not taking: Reported on 02/22/2022) 60 capsule 0  . hydrochlorothiazide (HYDRODIURIL) 25 MG tablet Take 25 mg by mouth daily.  (Patient not taking: Reported on 02/22/2022)    . irbesartan (AVAPRO) 300 MG tablet Take 300 mg by mouth daily. (Patient not taking: Reported on 02/22/2022)    . Multiple Vitamin (MULTIVITAMIN) tablet Take 1 tablet by mouth daily. (Patient not taking: Reported on 02/22/2022)    . Omega-3 Fatty Acids (FISH OIL) 1000 MG CAPS Take 1,000 mg by mouth daily. (Patient not taking: Reported on 02/22/2022)    . polyethylene glycol (MIRALAX / GLYCOLAX) packet 17grams in 16 oz of water  twice a day until bowel movement.  LAXITIVE.  Restart if two days since last bowel movement (Patient not taking: Reported on 02/22/2022) 60 each 0  . Potassium 99 MG TABS Take 1 tablet by mouth daily. (Patient not taking: Reported on 02/22/2022)     No facility-administered medications prior to visit.   Final Medications at End of Visit    Current Meds  Medication Sig  . albuterol (PROVENTIL HFA;VENTOLIN HFA) 108 (90 BASE) MCG/ACT inhaler Inhale 2 puffs into the lungs every 6 (six) hours as needed for wheezing or shortness of breath.  . ALPRAZolam (XANAX) 0.5 MG tablet Take 0.5 mg by mouth daily as needed for anxiety.  Marland Kitchen apixaban (ELIQUIS) 5 MG TABS tablet Take 1 tablet (5 mg total) by mouth 2 (two) times daily.  . Cholecalciferol (VITAMIN D3) 2000 units TABS Take 2,000 Units by mouth daily.  . Cyanocobalamin (VITAMIN B-12 PO) Take 1 tablet by mouth daily.  . DULoxetine (CYMBALTA) 60 MG capsule Take 120 mg by mouth 2 (two) times daily.  . metoprolol tartrate (LOPRESSOR) 50 MG tablet Take 1 tablet (50 mg total) by mouth 2 (two) times daily.  . pantoprazole (PROTONIX) 40 MG tablet Take 40 mg by mouth daily.   . rosuvastatin  (CRESTOR) 20 MG tablet Take 20 mg by mouth daily.   Radiology:   No results found.  Cardiac Studies:   Left Heart Catheterization 02/14/22:  LV: 132/6, EDP 19 mmHg.  Ao 164/71, mean 108 mmHg.  No pressure gradient across the aortic valve. LVEF normal at 55 to 60%.  No significant mitral regurgitation. LM: Large vessel, mild calcification evident. LAD: Large vessel, moderate diffuse coronary calcifications noted throughout the proximal and mid segment of the LAD.  There is a diffuse 30% calcific stenosis in the mid LAD after the origin of septal perforator 1.  D1 is large and has a ostial 50% stenosis.  Otherwise the LAD has mild diffuse disease distally. CX: Large vessel, has mild to moderate amount of coronary calcification in the proximal and mid segment.  There is mild diffuse disease, small AV groove circumflex, large OM1 with mild disease. RCA: Large-caliber vessel.  Again mild proximal coronary calcification is evident.  Mild luminal irregularities present.  Impression: The troponin elevation is related to demand ischemia and in the absence of high-grade coronary stenosis, normal LVEF and normal EKG, do not suspect non-ST elevation myocardial infarction.   EKG:   02/22/2022: sinus rhythm at a rate of 60 bpm. Normal axis. No evidence of ischemia or underlying injury pattern. Compared to EKG 02/13/22, no significant change   02/14/22: Telemetry strips and EMS strips evaluated in the chart, patient clearly had A-fib with RVR with nonspecific ST-T abnormality, cannot exclude ischemia. This normalized  Assessment     ICD-10-CM   1. Paroxysmal atrial fibrillation (HCC)  I48.0 EKG 12-Lead    PCV ECHOCARDIOGRAM COMPLETE    Ambulatory referral to Sleep Studies    CBC    Basic metabolic panel    2. PAD (peripheral artery disease) (HCC)  I73.9 PCV ANKLE BRACHIAL INDEX (ABI)    3. Coronary artery disease involving native coronary artery of native heart without angina pectoris  I25.10      4. Essential hypertension  I10     5. Hypercholesteremia  E78.00        There are no discontinued medications.  No orders of the defined types were placed in this encounter.  This patients CHA2DS2-VASc Score 4 (HTN, Vasc, A, F)  and yearly risk of stroke 4.8%.   Recommendations:   Kristen Wilson is a 71 y.o. Caucasian female with hypertension, hyperlipidemia, GERD, IBS, chronic palpitations and on beta-blocker therapy, admitted 02/13/22 with a fib with RVR. Further evaluation during admission revealed elevated troponin, she therefore underwent left heart catheterization with revealed only moderate non-obstructive CAD. Patient converted to normal sinus rhythm and discharged on Eliquis and Lopressor, as well as HCTZ, irbesartan, and rosuvastatin.   Patient now presents for hospital follow up. Reviewed and discussed at length pathophysiology of atrial fibrillation. Also again discussed indication, risk, benefit of anticoagulation and share decision was to continue Elquis. Will obtain CBC and advised patient to follow up with PCP for evaluation and treatment of hemorrhoids. Patient is presently maintaining normal sinus rhythm, Counseled her regarding signs and symptoms that would warrant urgent or emergent evaluation.   Blood pressure is uncontrolled. Will resume irbesartan and HCTZ as previously prescribed. Repeat BMP in 1 week. Given new onset a fib will obtain echocardiogram and referral to sleep study. Given PAD will obtain repeat ABI. In regard to CAD, lipid are well controlled. Continue statin therapy. Patient is not on aspirin due to Eliquis use in the setting of a fib.   Follow up in 4 weeks, sooner if needed.    Alethia Berthold, PA-C 02/22/2022, 10:54 AM Office: [phone]

## 2022-02-22 ENCOUNTER — Encounter: Payer: Self-pay | Admitting: Student

## 2022-02-22 ENCOUNTER — Ambulatory Visit: Payer: Medicare Other | Admitting: Student

## 2022-02-22 VITALS — BP 170/84 | HR 61 | Temp 98.3°F | Resp 16 | Ht 61.0 in | Wt 140.8 lb

## 2022-02-22 DIAGNOSIS — E7800 Pure hypercholesterolemia, unspecified: Secondary | ICD-10-CM | POA: Diagnosis not present

## 2022-02-22 DIAGNOSIS — I480 Paroxysmal atrial fibrillation: Secondary | ICD-10-CM | POA: Diagnosis not present

## 2022-02-22 DIAGNOSIS — I2510 Atherosclerotic heart disease of native coronary artery without angina pectoris: Secondary | ICD-10-CM

## 2022-02-22 DIAGNOSIS — I10 Essential (primary) hypertension: Secondary | ICD-10-CM | POA: Diagnosis not present

## 2022-02-22 DIAGNOSIS — I739 Peripheral vascular disease, unspecified: Secondary | ICD-10-CM

## 2022-02-22 DIAGNOSIS — I4891 Unspecified atrial fibrillation: Secondary | ICD-10-CM

## 2022-02-22 NOTE — Patient Instructions (Signed)
Heart-Healthy Eating Plan Many factors influence your heart (coronary) health, including eating and exercise habits. Coronary risk increases with abnormal blood fat (lipid) levels. Heart-healthy meal planning includes limiting unhealthy fats, increasing healthy fats, and making other diet and lifestyle changes. What is my plan? Your health care provider may recommend that you: Limit your fat intake to _________% or less of your total calories each day. Limit your saturated fat intake to _________% or less of your total calories each day. Limit the amount of cholesterol in your diet to less than _________ mg per day. What are tips for following this plan? Cooking Cook foods using methods other than frying. Baking, boiling, grilling, and broiling are all good options. Other ways to reduce fat include: Removing the skin from poultry. Removing all visible fats from meats. Steaming vegetables in water or broth. Meal planning  At meals, imagine dividing your plate into fourths: Fill one-half of your plate with vegetables and green salads. Fill one-fourth of your plate with whole grains. Fill one-fourth of your plate with lean protein foods. Eat 4-5 servings of vegetables per day. One serving equals 1 cup raw or cooked vegetable, or 2 cups raw leafy greens. Eat 4-5 servings of fruit per day. One serving equals 1 medium whole fruit,  cup dried fruit,  cup fresh, frozen, or canned fruit, or  cup 100% fruit juice. Eat more foods that contain soluble fiber. Examples include apples, broccoli, carrots, beans, peas, and barley. Aim to get 25-30 g of fiber per day. Increase your consumption of legumes, nuts, and seeds to 4-5 servings per week. One serving of dried beans or legumes equals  cup cooked, 1 serving of nuts is  cup, and 1 serving of seeds equals 1 tablespoon. Fats Choose healthy fats more often. Choose monounsaturated and polyunsaturated fats, such as olive and canola oils, flaxseeds,  walnuts, almonds, and seeds. Eat more omega-3 fats. Choose salmon, mackerel, sardines, tuna, flaxseed oil, and ground flaxseeds. Aim to eat fish at least 2 times each week. Check food labels carefully to identify foods with trans fats or high amounts of saturated fat. Limit saturated fats. These are found in animal products, such as meats, butter, and cream. Plant sources of saturated fats include palm oil, palm kernel oil, and coconut oil. Avoid foods with partially hydrogenated oils in them. These contain trans fats. Examples are stick margarine, some tub margarines, cookies, crackers, and other baked goods. Avoid fried foods. General information Eat more home-cooked food and less restaurant, buffet, and fast food. Limit or avoid alcohol. Limit foods that are high in starch and sugar. Lose weight if you are overweight. Losing just 5-10% of your body weight can help your overall health and prevent diseases such as diabetes and heart disease. Monitor your salt (sodium) intake, especially if you have high blood pressure. Talk with your health care provider about your sodium intake. Try to incorporate more vegetarian meals weekly. What foods can I eat? Fruits All fresh, canned (in natural juice), or frozen fruits. Vegetables Fresh or frozen vegetables (raw, steamed, roasted, or grilled). Green salads. Grains Most grains. Choose whole wheat and whole grains most of the time. Rice and pasta, including brown rice and pastas made with whole wheat. Meats and other proteins Lean, well-trimmed beef, veal, pork, and lamb. Chicken and [place] without skin. All fish and shellfish. Wild duck, rabbit, pheasant, and venison. Egg whites or low-cholesterol egg substitutes. Dried beans, peas, lentils, and tofu. Seeds and most nuts. Dairy Low-fat or nonfat cheeses,  including ricotta and mozzarella. Skim or 1% milk (liquid, powdered, or evaporated). Buttermilk made with low-fat milk. Nonfat or low-fat  yogurt. Fats and oils Non-hydrogenated (trans-free) margarines. Vegetable oils, including soybean, sesame, sunflower, olive, peanut, safflower, corn, canola, and cottonseed. Salad dressings or mayonnaise made with a vegetable oil. Beverages Water (mineral or sparkling). Coffee and tea. Diet carbonated beverages. Sweets and desserts Sherbet, gelatin, and fruit ice. Small amounts of dark chocolate. Limit all sweets and desserts. Seasonings and condiments All seasonings and condiments. The items listed above may not be a complete list of foods and beverages you can eat. Contact a dietitian for more options. What foods are not recommended? Fruits Canned fruit in heavy syrup. Fruit in cream or butter sauce. Fried fruit. Limit coconut. Vegetables Vegetables cooked in cheese, cream, or butter sauce. Fried vegetables. Grains Breads made with saturated or trans fats, oils, or whole milk. Croissants. Sweet rolls. Donuts. High-fat crackers, such as cheese crackers. Meats and other proteins Fatty meats, such as hot dogs, ribs, sausage, bacon, rib-eye roast or steak. High-fat deli meats, such as salami and bologna. Caviar. Domestic duck and goose. Organ meats, such as liver. Dairy Cream, sour cream, cream cheese, and creamed cottage cheese. Whole-milk cheeses. Whole or 2% milk (liquid, evaporated, or condensed). Whole buttermilk. Cream sauce or high-fat cheese sauce. Whole-milk yogurt. Fats and oils Meat fat, or shortening. Cocoa butter, hydrogenated oils, palm oil, coconut oil, palm kernel oil. Solid fats and shortenings, including bacon fat, salt pork, lard, and butter. Nondairy cream substitutes. Salad dressings with cheese or sour cream. Beverages Regular sodas and any drinks with added sugar. Sweets and desserts Frosting. Pudding. Cookies. Cakes. Pies. Milk chocolate or white chocolate. Buttered syrups. Full-fat ice cream or ice cream drinks. The items listed above may not be a complete list of  foods and beverages to avoid. Contact a dietitian for more information. Summary Heart-healthy meal planning includes limiting unhealthy fats, increasing healthy fats, and making other diet and lifestyle changes. Lose weight if you are overweight. Losing just 5-10% of your body weight can help your overall health and prevent diseases such as diabetes and heart disease. Focus on eating a balance of foods, including fruits and vegetables, low-fat or nonfat dairy, lean protein, nuts and legumes, whole grains, and heart-healthy oils and fats. This information is not intended to replace advice given to you by your health care provider. Make sure you discuss any questions you have with your health care provider. Document Revised: 12/22/2020 Document Reviewed: 12/22/2020 Elsevier Patient Education  2022 [employer].

## 2022-03-02 ENCOUNTER — Telehealth (HOSPITAL_COMMUNITY): Payer: Self-pay

## 2022-03-02 ENCOUNTER — Other Ambulatory Visit (HOSPITAL_COMMUNITY): Payer: Self-pay

## 2022-03-02 NOTE — Telephone Encounter (Signed)
Pharmacy Transitions of Care Follow-up Telephone Call  Date of discharge: 02/14/2022  Discharge Diagnosis: Atrial Fibrillation   How have you been since you were released from the hospital? Patient has been doing well since discharge. She does not have any questions or concerns regarding her medications.    Medication changes made at discharge: START taking: Eliquis (apixaban)  CHANGE how you take: DULoxetine (CYMBALTA)  metoprolol tartrate (LOPRESSOR)  STOP taking: aspirin 81 MG chewable tablet  meloxicam 15 MG tablet (MOBIC)  neomycin-polymyxin b-dexamethasone 3.5-10000-0.1 Oint (MAXITROL)   Medication changes verified by the patient? Yes    Medication Accessibility:  Home Pharmacy: [facility]    Was the patient provided with refills on discharged medications? No   Have all prescriptions been transferred from [facility] to home pharmacy? Yes   Is the patient able to afford medications? Has insurance, [facility]     Medication Review:   APIXABAN (ELIQUIS)  Apixaban 5 mg BID started 02/14/2022 - Discussed importance of taking medication around the same time everyday  - Advised patient of medications to avoid (NSAIDs, ASA)  - Educated that Tylenol (acetaminophen) will be the preferred analgesic to prevent risk of bleeding  - Emphasized importance of monitoring for signs and symptoms of bleeding (abnormal bruising, prolonged bleeding, nose bleeds, bleeding from gums, discolored urine, black tarry stools)  - Advised patient to alert all providers of anticoagulation therapy prior to starting a new medication or having a procedure   Follow-up Appointments:  [facility] f/u appt confirmed? Seen Lawerance Cruel, PA on 02/22/2022.   If their condition worsens, is the pt aware to call PCP or go to the Emergency Dept.? Yes  Final Patient Assessment: Patient does not have refills currently and is planning to contact her PCP for a follow-up appointment before she runs out of  Eliquis.

## 2022-03-15 DIAGNOSIS — I872 Venous insufficiency (chronic) (peripheral): Secondary | ICD-10-CM | POA: Diagnosis not present

## 2022-03-15 DIAGNOSIS — I10 Essential (primary) hypertension: Secondary | ICD-10-CM | POA: Diagnosis not present

## 2022-03-15 DIAGNOSIS — Z87891 Personal history of nicotine dependence: Secondary | ICD-10-CM | POA: Diagnosis not present

## 2022-03-15 DIAGNOSIS — I4891 Unspecified atrial fibrillation: Secondary | ICD-10-CM | POA: Diagnosis not present

## 2022-03-15 DIAGNOSIS — Z79899 Other long term (current) drug therapy: Secondary | ICD-10-CM | POA: Diagnosis not present

## 2022-03-21 ENCOUNTER — Ambulatory Visit: Payer: Medicare Other

## 2022-03-21 DIAGNOSIS — I739 Peripheral vascular disease, unspecified: Secondary | ICD-10-CM

## 2022-03-21 DIAGNOSIS — I480 Paroxysmal atrial fibrillation: Secondary | ICD-10-CM | POA: Diagnosis not present

## 2022-04-12 ENCOUNTER — Ambulatory Visit: Payer: Medicare Other | Admitting: Cardiology

## 2022-04-12 ENCOUNTER — Encounter: Payer: Self-pay | Admitting: Cardiology

## 2022-04-12 VITALS — BP 120/68 | HR 59 | Resp 16 | Ht 61.0 in | Wt 142.0 lb

## 2022-04-12 DIAGNOSIS — I480 Paroxysmal atrial fibrillation: Secondary | ICD-10-CM | POA: Diagnosis not present

## 2022-04-12 DIAGNOSIS — I10 Essential (primary) hypertension: Secondary | ICD-10-CM

## 2022-04-12 DIAGNOSIS — I252 Old myocardial infarction: Secondary | ICD-10-CM

## 2022-04-12 DIAGNOSIS — Z87891 Personal history of nicotine dependence: Secondary | ICD-10-CM

## 2022-04-12 DIAGNOSIS — Z7901 Long term (current) use of anticoagulants: Secondary | ICD-10-CM | POA: Diagnosis not present

## 2022-04-12 DIAGNOSIS — I2510 Atherosclerotic heart disease of native coronary artery without angina pectoris: Secondary | ICD-10-CM | POA: Diagnosis not present

## 2022-04-12 DIAGNOSIS — E7800 Pure hypercholesterolemia, unspecified: Secondary | ICD-10-CM

## 2022-04-12 DIAGNOSIS — I2584 Coronary atherosclerosis due to calcified coronary lesion: Secondary | ICD-10-CM | POA: Diagnosis not present

## 2022-04-12 NOTE — Progress Notes (Signed)
ID:  Kristen Wilson, DOB 1950-12-25, MRN [id number]  PCP:  [name], MD  Cardiologist:  Rex Kras, DO, [facility] (established care 02/14/2022) Former Cardiology Providers: Dr. Adrian Prows, Lawerance Cruel, [place]  Date: 04/12/22  Chief Complaint  Patient presents with   Atrial Fibrillation   Hypertension   Follow-up    HPI  Kristen Wilson is a 71 y.o. Caucasian female whose past medical history and cardiovascular risk factors include: Moderate nonobstructive CAD, hypertension, hyperlipidemia, GERD, IBS, chronic palpitations, former smoker (quit August 2023), postmenopausal female.  Recently hospitalized in June 2023 for atrial fibrillation with rapid ventricular rate.  She had chest pain while being in A-fib with RVR and high sensitive troponins were slightly elevated.  Cardiology was consulted for A-fib and elevated troponins.  Since establishing care patient did undergo heart catheterization and was noted to have nonobstructive CAD.  At the last office visit antihypertensive medications were restarted and her blood pressures have improved.  She also had an ankle-brachial index performed to evaluate for PAD.  She denies any symptoms of claudication.  Patient is seeing sleep medicine in the coming weeks.  She will also stop smoking as of August 2023.  She is congratulated on her efforts.  ALLERGIES: Allergies  Allergen Reactions   Latex Hives   Nsaids     Aleve / Ibuprofen  - causes Blood Pressure to rise   Percocet [Oxycodone-Acetaminophen] Nausea And Vomiting    MEDICATION LIST PRIOR TO VISIT: Current Meds  Medication Sig   albuterol (PROVENTIL HFA;VENTOLIN HFA) 108 (90 BASE) MCG/ACT inhaler Inhale 2 puffs into the lungs every 6 (six) hours as needed for wheezing or shortness of breath.   ALPRAZolam (XANAX) 0.5 MG tablet Take 0.5 mg by mouth daily as needed for anxiety.   apixaban (ELIQUIS) 5 MG TABS tablet Take 1 tablet (5 mg total) by mouth 2 (two) times daily.    Cholecalciferol (VITAMIN D3) 2000 units TABS Take 2,000 Units by mouth daily.   Cyanocobalamin (VITAMIN B-12 PO) Take 1 tablet by mouth daily.   docusate sodium (COLACE) 100 MG capsule 1 tab 2 times a day while on narcotics.  STOOL SOFTENER   DULoxetine (CYMBALTA) 60 MG capsule Take 120 mg by mouth 2 (two) times daily.   hydrochlorothiazide (HYDRODIURIL) 25 MG tablet Take 25 mg by mouth daily.   irbesartan (AVAPRO) 300 MG tablet Take 300 mg by mouth daily.   metoprolol tartrate (LOPRESSOR) 50 MG tablet Take 1 tablet (50 mg total) by mouth 2 (two) times daily.   Multiple Vitamin (MULTIVITAMIN) tablet Take 1 tablet by mouth daily.   pantoprazole (PROTONIX) 40 MG tablet Take 40 mg by mouth daily.    polyethylene glycol (MIRALAX / GLYCOLAX) packet 17grams in 16 oz of water twice a day until bowel movement.  LAXITIVE.  Restart if two days since last bowel movement   rosuvastatin (CRESTOR) 20 MG tablet Take 20 mg by mouth daily.     PAST MEDICAL HISTORY: Past Medical History:  Diagnosis Date   Allergy    Anginal pain ([place])    hx- non recent - r/t anxiety/panic attack   Anxiety    Arthritis    "Whole body"   Bronchitis    uses inhaler prn   Cough    smoker s cough   Depression    Diverticulitis    GERD (gastroesophageal reflux disease)    Hiatal hernia    Hyperlipidemia    Hypertension    IBS (irritable bowel syndrome)  Osteoporosis    Paroxysmal A-fib (HCC)    Pneumonia    hx   Primary localized osteoarthritis of left knee    Shortness of breath    occ   Smoker    SVD (spontaneous vaginal delivery)    x 2   Upper respiratory tract infection 06/14/2015   Starting IV Rocephin today also starting nebulizers for wheezing    PAST SURGICAL HISTORY: Past Surgical History:  Procedure Laterality Date   CHOLECYSTECTOMY     COLONOSCOPY N/A 03/20/2013   Procedure: COLONOSCOPY;  Surgeon: Wonda Horner, MD;  Location: [facility];  Service: Endoscopy;  Laterality: N/A;   CYST  EXCISION PERINEAL  03/20/2018   Procedure: INCISION AND DRAINAGE OF PERINEAL CYST;  Surgeon: Brien Few, MD;  Location: [facility];  Service: Gynecology;;   DILATION AND CURETTAGE OF UTERUS     KNEE ARTHROSCOPY     left   LEFT HEART CATH AND CORONARY ANGIOGRAPHY N/A 02/14/2022   Procedure: LEFT HEART CATH AND CORONARY ANGIOGRAPHY;  Surgeon: Adrian Prows, MD;  Location: [place] [facility];  Service: Cardiovascular;  Laterality: N/A;   TONSILLECTOMY     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  SOCIAL HISTORY:  The patient  reports that she quit smoking about 2 months ago. Her smoking use included cigarettes. She has a 60.00 pack-year smoking history. She has never used smokeless tobacco. She reports that she does not drink alcohol and does not use drugs.  REVIEW OF SYSTEMS: Review of Systems  Cardiovascular:  Negative for chest pain, claudication, dyspnea on exertion, irregular heartbeat, leg swelling, near-syncope, orthopnea, palpitations, paroxysmal nocturnal dyspnea and syncope.  Respiratory:  Negative for shortness of breath.   Hematologic/Lymphatic: Negative for bleeding problem.  Musculoskeletal:  Negative for muscle cramps and myalgias.  Neurological:  Negative for dizziness and light-headedness.    PHYSICAL EXAM:    04/12/2022   10:09 AM 02/22/2022    9:56 AM 02/22/2022    9:37 AM  Vitals with BMI  Height '5\' 1"'$   '5\' 1"'$   Weight 142 lbs  140 lbs 13 oz  BMI 56.21  30.86   Systolic [phone]  Diastolic 68 84 85  Pulse 59 61 62    Physical Exam  Constitutional: No distress.  Age appropriate, hemodynamically stable.   Neck: No JVD present.  Cardiovascular: Normal rate, regular rhythm, S1 normal, S2 normal and intact distal pulses. Exam reveals no gallop, no S3 and no S4.  Murmur heard. Systolic murmur is present with a grade of 3/6 at the lower left sternal border. Pulses:      Dorsalis pedis pulses are 1+ on the right side and 1+ on the left side.       Posterior tibial pulses are 0 on the right side and 0 on the left side.  Pulmonary/Chest: Effort normal and breath sounds normal. No stridor. She has no wheezes. She has no rales.  Abdominal: Soft.  Bowel sounds are normal. She exhibits no distension. There is no abdominal tenderness.  Musculoskeletal:        General: No edema.     Cervical back: Neck supple.  Neurological: She is alert and oriented to person, place, and time. She has intact cranial nerves (2-12).  Skin: Skin is warm and moist.   CARDIAC DATABASE: EKG: Telemetry strips and EMS strips evaluated in the chart, patient clearly had A-fib with RVR with nonspecific ST-T abnormality, cannot exclude ischemia. This normalized.  02/22/2022: sinus rhythm at a rate of 60 bpm. Normal axis. No evidence of ischemia or underlying injury pattern. Compared to EKG 02/13/22, no significant change  Echocardiogram: 02/19/2022 1. Normal LV systolic function with EF 66%. Left ventricle cavity is normal in size. Normal left ventricular wall thickness. Normal global wall motion. 2. Structurally normal trileaflet aortic valve. Mild (Grade I) aortic regurgitation. 3. Structurally normal tricuspid valve. Mild tricuspid regurgitation.   Stress Testing: No results found for this or any previous visit from the past 1095 days.   Heart Catheterization: Left Heart Catheterization 02/14/22:  LV: 132/6, EDP 19 mmHg.  Ao 164/71, mean 108 mmHg.  No pressure gradient  across the aortic valve. LVEF normal at 55 to 60%.  No significant mitral regurgitation. LM: Large vessel, mild calcification evident. LAD: Large vessel, moderate diffuse coronary calcifications noted throughout the proximal and mid segment of the LAD.  There is a diffuse 30% calcific stenosis in the mid LAD after the origin of septal perforator 1.  D1 is large and has a ostial 50% stenosis.  Otherwise the LAD has mild diffuse disease distally. CX: Large vessel, has mild to moderate amount of coronary calcification in the proximal and mid segment.  There is mild diffuse disease, small AV groove circumflex, large OM1 with mild disease. RCA: Large-caliber vessel.  Again mild proximal coronary calcification is evident.  Mild luminal irregularities present.  Impression: The troponin elevation is related to demand ischemia and in the absence of high-grade coronary stenosis, normal LVEF and normal EKG, do not suspect non-ST elevation myocardial infarction.  ABI 03/21/2022:  This exam reveals normal perfusion of the right lower extremity (ABI 1.15) with mildly abnormal biphasic waveform at the ankle.    This exam reveals normal perfusion of the left lower extremity (ABI 1.20) with mildly abnormal biphasic waveform at the [facility] and severely abnormal monophasic waveform at the PT at the level of the ankle.   LABORATORY DATA:    Latest Ref Rng & Units 02/14/2022    5:30 AM 02/13/2022    4:15 PM 10/31/2020    2:57 PM  CBC  WBC 4.0 - 10.5 K/uL 14.7  19.4  6.9   Hemoglobin 12.0 - 15.0 g/dL 14.5  15.6  15.8   Hematocrit 36.0 - 46.0 % 42.4  44.9  46.1   Platelets 150 - 400 K/uL 159  205  164        Latest Ref Rng & Units 02/14/2022    5:30 AM 02/13/2022    4:15 PM 10/31/2020    2:57 PM  CMP  Glucose 70 - 99 mg/dL 122  156  97   BUN 8 - 23 mg/dL '15  14  9   '$ Creatinine 0.44 - 1.00 mg/dL 0.79  1.01  0.79   Sodium 135 - 145 mmol/L 135  134  139   Potassium 3.5 - 5.1 mmol/L 3.6  3.2  3.5   Chloride 98 - 111  mmol/L 100  97  97   CO2 22 - 32 mmol/L 28  28  32   Calcium 8.9 - 10.3 mg/dL 7.8  8.0  9.2   Total Protein 6.5 - 8.1 g/dL 4.2   6.7   Total Bilirubin 0.3 - 1.2 mg/dL 0.7   0.9   Alkaline Phos 38 - 126 U/L 56   75   AST 15 - 41 U/L 25   32   ALT 0 - 44 U/L 30   28     Lipid Panel     Component Value Date/Time   CHOL 78 02/14/2022 0530   TRIG 67 02/14/2022 0530   HDL 32 (L) 02/14/2022 0530   CHOLHDL 2.4 02/14/2022 0530   VLDL 13 02/14/2022 0530   LDLCALC 33 02/14/2022 0530    No components found for: "NTPROBNP" No results for input(s): "PROBNP" in the last 8760 hours. Recent Labs    02/13/22 1615  TSH 0.781    BMP Recent Labs    02/13/22 1615 02/14/22 0530  NA 134* 135  K 3.2* 3.6  CL 97* 100  CO2 28 28  GLUCOSE 156* 122*  BUN 14 15  CREATININE 1.01* 0.79  CALCIUM 8.0* 7.8*  GFRNONAA 60* >60    HEMOGLOBIN A1C Lab Results  Component Value Date   HGBA1C 5.5 02/13/2022   MPG 111.15 02/13/2022    IMPRESSION:    ICD-10-CM   1. Paroxysmal atrial fibrillation (HCC)  I48.0 Hemoglobin and hematocrit, blood    Basic metabolic panel    2. Long term (current) use of anticoagulants  Z79.01 Hemoglobin and hematocrit, blood    Basic metabolic panel    3. Nonobstructive atherosclerosis of coronary artery  I25.10     4. Coronary atherosclerosis due to calcified coronary lesion  I25.10    I25.84     5. Hx of non-ST elevation myocardial infarction (NSTEMI)  I25.2     6. Benign hypertension  I10     7. Pure hypercholesterolemia  E78.00     8. Former smoker  Z87.891        RECOMMENDATIONS: Kristen Wilson is a 71 y.o. Caucasian female whose past medical history and cardiac risk factors include: Moderate nonobstructive CAD, hypertension, hyperlipidemia, GERD, IBS, chronic palpitations, former smoker (quit August 2023), postmenopausal female.  Paroxysmal atrial fibrillation (HCC) Rate control: Metoprolol. Rhythm control: N/A. Thromboembolic prophylaxis:  Eliquis. CHA2DS2-VASc SCORE is 4 which correlates to 4.8% risk of stroke per year (HTN, vascular, age, gender).  Long term (current) use of anticoagulants Indication: Paroxysmal atrial fibrillation. Reemphasized the risks, benefits, and alternatives to oral anticoagulation. Since last office visit she has not had any hemorrhoidal bleeding. We will check hemoglobin and BNP in 6 months to reevaluate.  Nonobstructive atherosclerosis of coronary artery Denies angina pectoris. Medications reconciled. Patient has stopped smoking since last office visit-congratulated on her efforts. Patient is educated on the importance of improving her modifiable cardiovascular risk factors. Reviewed the echo, heart cath, and labs as part of today's office visit.  Benign hypertension Office blood pressures are well controlled. Continue current medical therapy. Reemphasized importance of a low-salt diet.  Pure hypercholesterolemia Currently on rosuvastatin 20 mg p.o. nightly. Does not endorse myalgias. Currently managed by primary care provider.  Former smoker Quit August 2023. Reemphasized importance of continued complete smoking cessation.  FINAL MEDICATION LIST END OF ENCOUNTER: No orders of the defined types were placed in this encounter.   Medications Discontinued During This Encounter  Medication Reason   Omega-3 Fatty Acids ([place]  OIL) 1000 MG CAPS    Potassium 99 MG TABS      Current Outpatient Medications:    albuterol (PROVENTIL HFA;VENTOLIN HFA) 108 (90 BASE) MCG/ACT inhaler, Inhale 2 puffs into the lungs every 6 (six) hours as needed for wheezing or shortness of breath., Disp: 1 Inhaler, Rfl: 2   ALPRAZolam (XANAX) 0.5 MG tablet, Take 0.5 mg by mouth daily as needed for anxiety., Disp: , Rfl:    apixaban (ELIQUIS) 5 MG TABS tablet, Take 1 tablet (5 mg total) by mouth 2 (two) times daily., Disp: 60 tablet, Rfl: 0   Cholecalciferol (VITAMIN D3) 2000 units TABS, Take 2,000 Units by mouth  daily., Disp: , Rfl:    Cyanocobalamin (VITAMIN B-12 PO), Take 1 tablet by mouth daily., Disp: , Rfl:    docusate sodium (COLACE) 100 MG capsule, 1 tab 2 times a day while on narcotics.  STOOL SOFTENER, Disp: 60 capsule, Rfl: 0   DULoxetine (CYMBALTA) 60 MG capsule, Take 120 mg by mouth 2 (two) times daily., Disp: , Rfl:    hydrochlorothiazide (HYDRODIURIL) 25 MG tablet, Take 25 mg by mouth daily., Disp: , Rfl:    irbesartan (AVAPRO) 300 MG tablet, Take 300 mg by mouth daily., Disp: , Rfl:    metoprolol tartrate (LOPRESSOR) 50 MG tablet, Take 1 tablet (50 mg total) by mouth 2 (two) times daily., Disp: 60 tablet, Rfl: 1   Multiple Vitamin (MULTIVITAMIN) tablet, Take 1 tablet by mouth daily., Disp: , Rfl:    pantoprazole (PROTONIX) 40 MG tablet, Take 40 mg by mouth daily. , Disp: , Rfl:    polyethylene glycol (MIRALAX / GLYCOLAX) packet, 17grams in 16 oz of water twice a day until bowel movement.  LAXITIVE.  Restart if two days since last bowel movement, Disp: 60 each, Rfl: 0   rosuvastatin (CRESTOR) 20 MG tablet, Take 20 mg by mouth daily., Disp: , Rfl:   Orders Placed This Encounter  Procedures   Hemoglobin and hematocrit, blood   Basic metabolic panel    There are no Patient Instructions on file for this visit.   --Continue cardiac medications as reconciled in final medication list. --Return in about 6 months (around 10/13/2022) for Follow up, A. fib. or sooner if needed. --Continue follow-up with your primary care physician regarding the management of your other chronic comorbid conditions.  Patient's questions and concerns were addressed to her satisfaction. She voices understanding of the instructions provided during this encounter.   This note was created using a voice recognition software as a result there may be grammatical errors inadvertently enclosed that do not reflect the nature of this encounter. Every attempt is made to correct such errors.  Rex Kras, [place], [facility]  Pager:  [phone] Office: [phone]

## 2022-04-18 ENCOUNTER — Institutional Professional Consult (permissible substitution): Payer: Medicare Other | Admitting: Neurology

## 2022-04-19 DIAGNOSIS — E559 Vitamin D deficiency, unspecified: Secondary | ICD-10-CM | POA: Diagnosis not present

## 2022-04-19 DIAGNOSIS — E538 Deficiency of other specified B group vitamins: Secondary | ICD-10-CM | POA: Diagnosis not present

## 2022-04-19 DIAGNOSIS — R7989 Other specified abnormal findings of blood chemistry: Secondary | ICD-10-CM | POA: Diagnosis not present

## 2022-04-25 DIAGNOSIS — Z0000 Encounter for general adult medical examination without abnormal findings: Secondary | ICD-10-CM | POA: Diagnosis not present

## 2022-04-27 DIAGNOSIS — J449 Chronic obstructive pulmonary disease, unspecified: Secondary | ICD-10-CM | POA: Diagnosis not present

## 2022-04-27 DIAGNOSIS — Z23 Encounter for immunization: Secondary | ICD-10-CM | POA: Diagnosis not present

## 2022-04-27 DIAGNOSIS — F17210 Nicotine dependence, cigarettes, uncomplicated: Secondary | ICD-10-CM | POA: Diagnosis not present

## 2022-04-27 DIAGNOSIS — M791 Myalgia, unspecified site: Secondary | ICD-10-CM | POA: Diagnosis not present

## 2022-04-27 DIAGNOSIS — I10 Essential (primary) hypertension: Secondary | ICD-10-CM | POA: Diagnosis not present

## 2022-04-27 DIAGNOSIS — E782 Mixed hyperlipidemia: Secondary | ICD-10-CM | POA: Diagnosis not present

## 2022-04-27 DIAGNOSIS — I739 Peripheral vascular disease, unspecified: Secondary | ICD-10-CM | POA: Diagnosis not present

## 2022-04-27 DIAGNOSIS — I4891 Unspecified atrial fibrillation: Secondary | ICD-10-CM | POA: Diagnosis not present

## 2022-06-13 DIAGNOSIS — M791 Myalgia, unspecified site: Secondary | ICD-10-CM | POA: Diagnosis not present

## 2022-06-13 DIAGNOSIS — R519 Headache, unspecified: Secondary | ICD-10-CM | POA: Diagnosis not present

## 2022-06-13 DIAGNOSIS — R059 Cough, unspecified: Secondary | ICD-10-CM | POA: Diagnosis not present

## 2022-06-13 DIAGNOSIS — R509 Fever, unspecified: Secondary | ICD-10-CM | POA: Diagnosis not present

## 2022-06-22 ENCOUNTER — Other Ambulatory Visit: Payer: Medicare Other

## 2022-08-14 DIAGNOSIS — I2510 Atherosclerotic heart disease of native coronary artery without angina pectoris: Secondary | ICD-10-CM | POA: Diagnosis not present

## 2022-08-14 DIAGNOSIS — I10 Essential (primary) hypertension: Secondary | ICD-10-CM | POA: Diagnosis not present

## 2022-08-14 DIAGNOSIS — J449 Chronic obstructive pulmonary disease, unspecified: Secondary | ICD-10-CM | POA: Diagnosis not present

## 2022-08-14 DIAGNOSIS — E782 Mixed hyperlipidemia: Secondary | ICD-10-CM | POA: Diagnosis not present

## 2022-08-14 DIAGNOSIS — I739 Peripheral vascular disease, unspecified: Secondary | ICD-10-CM | POA: Diagnosis not present

## 2022-08-14 DIAGNOSIS — F17210 Nicotine dependence, cigarettes, uncomplicated: Secondary | ICD-10-CM | POA: Diagnosis not present

## 2022-08-14 DIAGNOSIS — I4891 Unspecified atrial fibrillation: Secondary | ICD-10-CM | POA: Diagnosis not present

## 2022-08-14 DIAGNOSIS — M791 Myalgia, unspecified site: Secondary | ICD-10-CM | POA: Diagnosis not present

## 2022-08-14 DIAGNOSIS — I700 Atherosclerosis of aorta: Secondary | ICD-10-CM | POA: Diagnosis not present

## 2022-10-11 ENCOUNTER — Ambulatory Visit: Payer: Medicare Other | Admitting: Cardiology

## 2022-10-11 ENCOUNTER — Encounter: Payer: Self-pay | Admitting: Cardiology

## 2022-10-11 VITALS — BP 101/71 | HR 61 | Resp 16 | Ht 61.0 in | Wt 146.8 lb

## 2022-10-11 DIAGNOSIS — I10 Essential (primary) hypertension: Secondary | ICD-10-CM | POA: Diagnosis not present

## 2022-10-11 DIAGNOSIS — Z7901 Long term (current) use of anticoagulants: Secondary | ICD-10-CM

## 2022-10-11 DIAGNOSIS — E7800 Pure hypercholesterolemia, unspecified: Secondary | ICD-10-CM | POA: Diagnosis not present

## 2022-10-11 DIAGNOSIS — R9431 Abnormal electrocardiogram [ECG] [EKG]: Secondary | ICD-10-CM | POA: Diagnosis not present

## 2022-10-11 DIAGNOSIS — Z87891 Personal history of nicotine dependence: Secondary | ICD-10-CM | POA: Diagnosis not present

## 2022-10-11 DIAGNOSIS — I480 Paroxysmal atrial fibrillation: Secondary | ICD-10-CM | POA: Diagnosis not present

## 2022-10-11 DIAGNOSIS — I2510 Atherosclerotic heart disease of native coronary artery without angina pectoris: Secondary | ICD-10-CM | POA: Diagnosis not present

## 2022-10-11 DIAGNOSIS — I252 Old myocardial infarction: Secondary | ICD-10-CM

## 2022-10-11 DIAGNOSIS — R072 Precordial pain: Secondary | ICD-10-CM | POA: Diagnosis not present

## 2022-10-11 DIAGNOSIS — I2584 Coronary atherosclerosis due to calcified coronary lesion: Secondary | ICD-10-CM | POA: Diagnosis not present

## 2022-10-11 NOTE — Progress Notes (Signed)
ID:  Kristen Wilson, DOB 07-Jan-1951, MRN [id number]  PCP:  [name], MD  Cardiologist:  Rex Kras, DO, [facility] (established care 02/14/2022) Former Cardiology Providers: Dr. Adrian Prows, Lawerance Cruel, [place]  Date: 10/11/22 Last Office Visit: 04/12/2022  Chief Complaint  Patient presents with   Atrial Fibrillation   Follow-up    6 months    HPI  Kristen Wilson is a 72 y.o. Caucasian female whose past medical history and cardiovascular risk factors include: Moderate nonobstructive CAD, hypertension, hyperlipidemia, GERD, IBS, chronic palpitations, former smoker (quit August 2023), postmenopausal female.  Patient presents today for 13-monthfollow-up visit for atrial fibrillation management.  She is currently on rate control strategy and anticoagulation for thromboembolic prophylaxis.  Since last office visit patient is doing well from a cardiovascular standpoint with the exception of intermittent chest pain.  She is been having discomfort once every 2 weeks, substernally located, intensity is 5 out of 10, sharp like sensation, not brought on by effort related activities, does not resolve with rest, self-limited.  Last episode 1 week ago.  Her overall functional capacity is limited as she ambulates with a cane.  She had a heart catheterization in 2023 as outlined below and was noted to have nonobstructive CAD and coronary artery calcification.  In the past we have discussed seeing sleep medicine for possible sleep study given her diagnosis of A-fib.  This is still pending.  She has discussed it with PCP.  ALLERGIES: Allergies  Allergen Reactions   Latex Hives   Nsaids     Aleve / Ibuprofen  - causes Blood Pressure to rise   Percocet [Oxycodone-Acetaminophen] Nausea And Vomiting    MEDICATION LIST PRIOR TO VISIT: Current Meds  Medication Sig   albuterol (PROVENTIL HFA;VENTOLIN HFA) 108 (90 BASE) MCG/ACT inhaler Inhale 2 puffs into the lungs every 6 (six) hours as needed for  wheezing or shortness of breath.   ALPRAZolam (XANAX) 0.5 MG tablet Take 0.5 mg by mouth daily as needed for anxiety.   apixaban (ELIQUIS) 5 MG TABS tablet Take 1 tablet (5 mg total) by mouth 2 (two) times daily.   Cholecalciferol (VITAMIN D3) 2000 units TABS Take 2,000 Units by mouth daily.   Cyanocobalamin (VITAMIN B-12 PO) Take 1 tablet by mouth daily.   docusate sodium (COLACE) 100 MG capsule 1 tab 2 times a day while on narcotics.  STOOL SOFTENER   DULoxetine (CYMBALTA) 60 MG capsule Take 120 mg by mouth 2 (two) times daily.   hydrochlorothiazide (HYDRODIURIL) 25 MG tablet Take 25 mg by mouth daily.   irbesartan (AVAPRO) 300 MG tablet Take 300 mg by mouth daily.   metoprolol tartrate (LOPRESSOR) 50 MG tablet Take 1 tablet (50 mg total) by mouth 2 (two) times daily.   Multiple Vitamin (MULTIVITAMIN) tablet Take 1 tablet by mouth daily.   pantoprazole (PROTONIX) 40 MG tablet Take 40 mg by mouth daily.    polyethylene glycol (MIRALAX / GLYCOLAX) packet 17grams in 16 oz of water twice a day until bowel movement.  LAXITIVE.  Restart if two days since last bowel movement   rosuvastatin (CRESTOR) 20 MG tablet Take 20 mg by mouth daily.     PAST MEDICAL HISTORY: Past Medical History:  Diagnosis Date   Allergy    Anginal pain ([facility]    hx- non recent - r/t anxiety/panic attack   Anxiety    Arthritis    "Whole body"   Bronchitis    uses inhaler prn   Cough  smoker s cough   Depression    Diverticulitis    GERD (gastroesophageal reflux disease)    Hiatal hernia    Hyperlipidemia    Hypertension    IBS (irritable bowel syndrome)    Osteoporosis    Paroxysmal A-fib (HCC)    Pneumonia    hx   Primary localized osteoarthritis of left knee    Shortness of breath    occ   Smoker    SVD (spontaneous vaginal delivery)    x 2   Upper respiratory tract infection 06/14/2015   Starting IV Rocephin today also starting nebulizers for wheezing    PAST SURGICAL HISTORY: Past Surgical  History:  Procedure Laterality Date   CHOLECYSTECTOMY     COLONOSCOPY N/A 03/20/2013   Procedure: COLONOSCOPY;  Surgeon: Wonda Horner, MD;  Location: [facility];  Service: Endoscopy;  Laterality: N/A;   CYST EXCISION PERINEAL  03/20/2018   Procedure: INCISION AND DRAINAGE OF PERINEAL CYST;  Surgeon: Brien Few, MD;  Location: [facility];  Service: Gynecology;;   DILATION AND CURETTAGE OF UTERUS     KNEE ARTHROSCOPY     left   LEFT HEART CATH AND CORONARY ANGIOGRAPHY N/A 02/14/2022   Procedure: LEFT HEART CATH AND CORONARY ANGIOGRAPHY;  Surgeon: Adrian Prows, MD;  Location: [facility];  Service: Cardiovascular;  Laterality: N/A;   TONSILLECTOMY     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  SOCIAL HISTORY:  The patient  reports that she quit smoking about 8 months ago. Her smoking use included cigarettes. She has a 60.00 pack-year smoking history. She has never used smokeless tobacco. She reports that she does not drink alcohol and does not use drugs.  REVIEW OF SYSTEMS: Review of Systems  Cardiovascular:  Positive for chest pain (See HPI). Negative for claudication, dyspnea on exertion, irregular heartbeat, leg swelling, near-syncope, orthopnea, palpitations, paroxysmal nocturnal dyspnea and syncope.  Respiratory:  Negative for shortness of breath.   Hematologic/Lymphatic: Negative for bleeding  problem.  Musculoskeletal:  Negative for muscle cramps and myalgias.  Gastrointestinal:  Positive for hemorrhoids (Rare episodes of hemorrhoidal bleeding).  Neurological:  Negative for dizziness and light-headedness.    PHYSICAL EXAM:    10/11/2022   11:38 AM 04/12/2022   10:09 AM 02/22/2022    9:56 AM  Vitals with BMI  Height 5' 1"$  5' 1"$    Weight 146 lbs 13 oz 142 lbs   BMI [id number] 0000000   Systolic [id number]  Diastolic 71 68 84  Pulse 61 59 61    Physical Exam  Constitutional: No distress.  Age appropriate, hemodynamically stable, walks w/ cane  Neck: No JVD present.  Cardiovascular: Normal rate, regular rhythm, S1 normal, S2 normal and intact distal pulses. Exam reveals no gallop, no S3 and no S4.  Murmur heard. Systolic murmur is present with a grade of 3/6  at the lower left sternal border. Pulses:      Dorsalis pedis pulses are 1+ on the right side and 1+ on the left side.       Posterior tibial pulses are 0 on the right side and 0 on the left side.  Pulmonary/Chest: Effort normal and breath sounds normal. No stridor. She has no wheezes. She has no rales.  Abdominal: Soft. Bowel sounds are normal. She exhibits no distension. There is no abdominal tenderness.  Musculoskeletal:        General: No edema.     Cervical back: Neck supple.  Neurological: She is alert and oriented to person, place, and time. She has intact cranial nerves (2-12).  Skin: Skin is warm and moist.   CARDIAC DATABASE: EKG: Telemetry strips and EMS strips evaluated in the chart, patient clearly had A-fib with RVR with nonspecific ST-T abnormality, cannot exclude ischemia. This normalized.  10/11/2022: Sinus rhythm, 60 bpm, normal axis, subtle ST-T changes in the inferolateral leads new compared to prior EKG 02/22/2022.  Echocardiogram: 02/19/2022 1. Normal LV systolic function with EF 66%. Left ventricle cavity is normal in size. Normal left ventricular wall thickness. Normal global wall motion. 2.  Structurally normal trileaflet aortic valve. Mild (Grade I) aortic regurgitation. 3. Structurally normal tricuspid valve. Mild tricuspid regurgitation.   Stress Testing: No results found for this or any previous visit from the past 1095 days.   Heart Catheterization: Left Heart Catheterization 02/14/22:  LV: 132/6, EDP 19 mmHg.  Ao 164/71, mean 108 mmHg.  No pressure gradient across the aortic valve. LVEF normal at 55 to 60%.  No significant mitral regurgitation. LM: Large vessel, mild calcification evident. LAD: Large vessel, moderate diffuse coronary calcifications noted throughout the proximal and mid segment of the LAD.  There is a diffuse 30% calcific stenosis in the mid LAD after the origin of septal perforator 1.  D1 is large and has a ostial 50% stenosis.  Otherwise the LAD has mild diffuse disease distally. CX: Large vessel, has mild to moderate amount of coronary calcification in the proximal and mid segment.  There is mild diffuse disease, small AV groove circumflex, large OM1 with mild disease. RCA: Large-caliber vessel.  Again mild proximal coronary calcification is evident.  Mild luminal irregularities present.  Impression: The troponin elevation is related to demand ischemia and in the absence of high-grade coronary stenosis, normal LVEF and normal EKG, do not suspect non-ST elevation myocardial infarction.  ABI 03/21/2022:  This exam reveals normal perfusion of the right lower extremity (ABI 1.15) with mildly abnormal biphasic waveform at the ankle.    This exam reveals normal perfusion of the left lower extremity (ABI 1.20) with mildly abnormal biphasic waveform at the [facility] and severely abnormal monophasic waveform at the PT at the level of the ankle.   LABORATORY DATA:    Latest Ref Rng & Units 02/14/2022    5:30 AM 02/13/2022    4:15 PM 10/31/2020    2:57 PM  CBC  WBC 4.0 - 10.5 K/uL 14.7  19.4  6.9   Hemoglobin 12.0 - 15.0 g/dL 14.5  15.6  15.8   Hematocrit 36.0 - 46.0 %  42.4  44.9  46.1   Platelets 150 - 400 K/uL 159  205  164        Latest Ref Rng & Units 02/14/2022    5:30 AM 02/13/2022    4:15 PM 10/31/2020    2:57 PM  CMP  Glucose 70 - 99 mg/dL 122  156  97   BUN 8 - 23 mg/dL 15  14  9   $ Creatinine 0.44 - 1.00 mg/dL 0.79  1.01  0.79   Sodium 135 - 145 mmol/L 135  134  139   Potassium 3.5 - 5.1 mmol/L 3.6  3.2  3.5   Chloride 98 - 111 mmol/L 100  97  97   CO2 22 - 32 mmol/L 28  28  32   Calcium 8.9 - 10.3 mg/dL 7.8  8.0  9.2   Total Protein 6.5 - 8.1 g/dL 4.2   6.7   Total Bilirubin 0.3 - 1.2 mg/dL 0.7   0.9   Alkaline Phos 38 - 126 U/L 56   75   AST 15 - 41 U/L 25   32   ALT 0 - 44 U/L 30   28     Lipid Panel     Component Value Date/Time   CHOL 78 02/14/2022 0530   TRIG 67 02/14/2022 0530   HDL 32 (L) 02/14/2022 0530   CHOLHDL 2.4 02/14/2022 0530   VLDL 13 02/14/2022 0530   LDLCALC 33 02/14/2022 0530    No components found for: "NTPROBNP" No results for input(s): "PROBNP" in the last 8760 hours. Recent Labs    02/13/22 1615  TSH 0.781    BMP Recent Labs    02/13/22 1615 02/14/22 0530  NA 134* 135  K 3.2* 3.6  CL 97* 100  CO2 28 28  GLUCOSE 156* 122*  BUN 14 15  CREATININE 1.01* 0.79  CALCIUM 8.0* 7.8*  GFRNONAA 60* >60    HEMOGLOBIN A1C Lab Results  Component Value Date   HGBA1C 5.5 02/13/2022   MPG 111.15 02/13/2022   RESULTS Component Value Reference Range Notes  CBC with Diff Reviewed date:03/18/2022 10:52:31 AM Interpretation:Normal Performing Lab: Notes/Report: Testing Performed at: [facility], [address], [address], [place], [place] [address]  WBC 8.1 4.0-11.0 K/ul    RBC 4.49 4.20-5.40 M/uL    HGB 14.2 12.0-16.0 g/dL    HCT 41.5 37.0-47.0 %    MCV 92.5 81.0-99.0 fL    MCH 31.6 27.0-33.0 pg    MCHC 34.2 32.0-36.0 g/dL    RDW 14.2 11.5-15.5 %    PLT 165 150-400 K/uL    MPV 10.7 7.5-10.7 fL    NE% 66.0 43.3-71.9 %    LY% 23.5 16.8-43.5 %    MO% 7.2 4.6-12.4 %    EO% 2.5 0.0-7.8 %     BA% 0.8 0.0-1.0 %    NE# 5.3 1.9-7.2 K/uL    LY# 1.90 1.10-2.70 K/uL    MO# 0.6 0.3-0.8 K/uL    EO# 0.2 0.0-0.6 K/uL    BA# 0.1 0.0-0.1 K/uL    NRBC% 0.10      NRBC# 0.01      [place] ANKLE/BRACHIAL INDICES BILAT Reviewed date:03/28/2022 05:20:47 PM Interpretation:Normal Performing Lab: Notes/Report: Normal  Transthoracic Echocardiogram Reviewed date:03/28/2022 05:22:00 PM Interpretation:Normal structure with SEF 65% Performing Lab: Notes/Report: Normal structure with SEF 65%  Sed Rate (ESR) Reviewed date:04/27/2022 06:21:09 PM Interpretation:normal Performing Lab: Notes/Report: Testing Performed at: [facility], [address], [address], [place], [place] [address]  ESR <1 0-20 mm/HR    Vitamin D 25(OH) Total Reviewed date:10/21/2021 02:09:26 PM Interpretation: Low Performing Lab: Notes/Report: Testing Performed at: [facility], [address], [address], [place], [place] [address]  25(OH) Vit D, Total 29.9 30.0-100.0 ng/mL    Vitamin B12 Reviewed date:10/21/2021 02:09:26 PM Interpretation: Low Performing Lab: Notes/Report: Testing  Performed at: [facility], [address], [address], [place], [place] [address]  B12 172 180-914 pg/mL    Lipid Panel w/reflex Reviewed date:10/21/2021 02:09:26 PM Interpretation:At goal Performing Lab: Notes/Report: Testing Performed at: [facility], [address], [place], [place] [address]  Cholesterol 100 <200 mg/dL    CHOL/HDL 3.7 2.0-4.0 Ratio    HDLD 27 30-85 mg/dL Values below 40 mg/dL indicate increased risk factor  Triglyceride 132 0-199 mg/dL    NHDL 73 0-129 mg/dL Range dependent upon risk factors.  LDL Chol Calc (NIH) 49 0-99 mg/dL    Comp Metabolic Panel Reviewed [id number] 02:09:26 PM Interpretation:Normal Performing Lab: Notes/Report: Testing Performed at: [facility], [address], [address], [place], [place] [address]  Glucose 92 70-99 mg/dL    BUN 9 6-26 mg/dL    Creatinine 0.86 0.60-1.30  mg/dl    [id number] 72 >60 calc In accordance with recommendations from NKF-ASN Task Force, [facility] has updated its eGFR calc to the 2021 CKD-EDI equation that estimates kidney function without a race variable;Stage 1 > 90 ML/Min plus Albuminuria;Stage 2 60-89 ML/MIN;Stage 3 30-59 ML/MIN;Stage 4 15-29 ML/MIN;Stage 5 <15 ML/MIN  Sodium 139 136-145 mmol/L    Potassium 3.5 3.5-5.5 mmol/L    Chloride 99 98-107 mmol/L    CO2 38 22-32 mmol/L    Anion Gap 6.7 6.0-20.0 mmol/L    Calcium 9.2 8.6-10.3 mg/dL    CA-corrected 9.32 8.60-10.30 mg/dL    Protein, Total 6.0 6.0-8.3 g/dL    Albumin 3.9 3.4-4.8 g/dL    TBIL 0.7 0.3-1.0 mg/dL    ALP 83 38-126 U/L    AST 17 0-39 U/L    ALT 15 0-52 U/L       IMPRESSION:    ICD-10-CM   1. Precordial pain  R07.2 PCV ECHOCARDIOGRAM COMPLETE    PCV MYOCARDIAL PERFUSION WITH LEXISCAN    2. Abnormal EKG  R94.31 PCV ECHOCARDIOGRAM COMPLETE    PCV MYOCARDIAL PERFUSION WITH LEXISCAN    3. Paroxysmal atrial fibrillation (HCC)  I48.0 EKG 12-Lead    Hemoglobin and hematocrit, blood    CMP14+EGFR    4. Long term (current) use of anticoagulants  Z79.01     5. Nonobstructive atherosclerosis of coronary artery  I25.10 Lipid Panel With LDL/HDL Ratio    LDL cholesterol, direct    6. Coronary atherosclerosis due to calcified coronary lesion  I25.10 Lipid Panel With LDL/HDL Ratio   I25.84 LDL cholesterol, direct    7. Hx of non-ST elevation myocardial infarction (NSTEMI)  I25.2     8. Benign hypertension  I10     9. Pure hypercholesterolemia  E78.00 CMP14+EGFR    Lipid Panel With LDL/HDL Ratio    LDL cholesterol, direct    10. Former smoker  Z87.891        RECOMMENDATIONS: Kristen Wilson is a 72 y.o. Caucasian female whose past medical history and cardiac risk factors include: Moderate nonobstructive CAD, hypertension, hyperlipidemia, GERD, IBS, chronic palpitations, former smoker (quit August 2023), postmenopausal female.  Precordial pain: Abnormal  EKG: Patient complains of precordial pain predominately noncardiac based on symptoms. EKG shows sinus rhythm with ST-T changes in inferolateral leads which are new compared to prior. She has multiple cardiovascular risk factors as outlined above. I reviewed the nongated CT studies that were done in the past she has coronary calcification as well as aortic atherosclerosis.  Given the degree of coronary artery calcification proceeding with a coronary CTA may be less diagnostic and therefore we will proceed  with nuclear stress test.  EKG is interpretable but patient is unable to exercise as she uses a cane. Continue current medical therapy. Educated on seeking medical attention sooner by going to the closest ER via EMS if the symptoms increase in intensity, frequency, duration, or has typical chest pain as discussed in the office.  Patient verbalized understanding.  Paroxysmal atrial fibrillation (HCC) Long term (current) use of anticoagulants Rate control: Metoprolol. Rhythm control: N/A. Thromboembolic prophylaxis: Eliquis. CHA2DS2-VASc SCORE is 4 which correlates to 4.8% risk of stroke per year (HTN, vascular, age, gender). EKG today: Reemphasized the risks, benefits, and alternatives to oral anticoagulation. Patient does not endorse evidence of bleeding, with the exception of at times hemorrhoidal bleeding. No new labs available for review.  Will check hemoglobin hematocrit and CMP. Patient will discuss with PCP with regards to seeing someone for sleep medicine to evaluate for both sleep apnea and or sleep-related disorders.  She was requesting pharmacological agents for sleeping aid.  I have requested her to start with melatonin over-the-counter until she sees her PCP.  Nonobstructive atherosclerosis of coronary artery Medications reconciled. Stop smoking in August 2023  Patient is educated on the importance of improving her modifiable cardiovascular risk factors. Ischemic workup as  outlined above.  Benign hypertension Office blood pressures are very well-controlled. Asymptomatic with regards to no lightheadedness or dizziness.  However if she has the symptoms would recommend down titration of antihypertensive medications. Currently managed by primary care provider.  Pure hypercholesterolemia No recent labs available for review. Will check a fasting lipid profile and direct LDL.  Continue current dose of statin therapy  Former smoker Quit August 2023. Reemphasized importance of continued complete smoking cessation.  Preliminary I have ordered hemoglobin/hematocrit, CMP, and fasting lipid profile.  I have asked her to have labs done with PCP first but if she is unable in the interim she can use my orders to get them done.  Patient is agreeable with the plan of care and is thankful.  FINAL MEDICATION LIST END OF ENCOUNTER: No orders of the defined types were placed in this encounter.   There are no discontinued medications.    Current Outpatient Medications:    albuterol (PROVENTIL HFA;VENTOLIN HFA) 108 (90 BASE) MCG/ACT inhaler, Inhale 2 puffs into the lungs every 6 (six) hours as needed for wheezing or shortness of breath., Disp: 1 Inhaler, Rfl: 2   ALPRAZolam (XANAX) 0.5 MG tablet, Take 0.5 mg by mouth daily as needed for anxiety., Disp: , Rfl:    apixaban (ELIQUIS) 5 MG TABS tablet, Take 1 tablet (5 mg total) by mouth 2 (two) times daily., Disp: 60 tablet, Rfl: 0   Cholecalciferol (VITAMIN D3) 2000 units TABS, Take 2,000 Units by mouth daily., Disp: , Rfl:    Cyanocobalamin (VITAMIN B-12 PO), Take 1 tablet by mouth daily., Disp: , Rfl:    docusate sodium (COLACE) 100 MG capsule, 1 tab 2 times a day while on narcotics.  STOOL SOFTENER, Disp: 60 capsule, Rfl: 0   DULoxetine (CYMBALTA) 60 MG capsule, Take 120 mg by mouth 2 (two) times daily., Disp: , Rfl:    hydrochlorothiazide (HYDRODIURIL) 25 MG tablet, Take 25 mg by mouth daily., Disp: , Rfl:    irbesartan  (AVAPRO) 300 MG tablet, Take 300 mg by mouth daily., Disp: , Rfl:    metoprolol tartrate (LOPRESSOR) 50 MG tablet, Take 1 tablet (50 mg total) by mouth 2 (two) times daily., Disp: 60 tablet, Rfl: 1   Multiple Vitamin (MULTIVITAMIN) tablet, Take  1 tablet by mouth daily., Disp: , Rfl:    pantoprazole (PROTONIX) 40 MG tablet, Take 40 mg by mouth daily. , Disp: , Rfl:    polyethylene glycol (MIRALAX / GLYCOLAX) packet, 17grams in 16 oz of water twice a day until bowel movement.  LAXITIVE.  Restart if two days since last bowel movement, Disp: 60 each, Rfl: 0   rosuvastatin (CRESTOR) 20 MG tablet, Take 20 mg by mouth daily., Disp: , Rfl:   Orders Placed This Encounter  Procedures   Hemoglobin and hematocrit, blood   CMP14+EGFR   Lipid Panel With LDL/HDL Ratio   LDL cholesterol, direct   PCV MYOCARDIAL PERFUSION WITH LEXISCAN   EKG 12-Lead   PCV ECHOCARDIOGRAM COMPLETE    There are no Patient Instructions on file for this visit.   --Continue cardiac medications as reconciled in final medication list. --Return in about 6 weeks (around 11/22/2022) for Reevaluation of, Chest pain, Review test results. or sooner if needed. --Continue follow-up with your primary care physician regarding the management of your other chronic comorbid conditions.  Patient's questions and concerns were addressed to her satisfaction. She voices understanding of the instructions provided during this encounter.   This note was created using a voice recognition software as a result there may be grammatical errors inadvertently enclosed that do not reflect the nature of this encounter. Every attempt is made to correct such errors.  Rex Kras, [place], [facility]  Pager: [phone] Office: [phone]

## 2022-10-17 ENCOUNTER — Other Ambulatory Visit: Payer: Medicare Other

## 2022-11-05 ENCOUNTER — Ambulatory Visit: Payer: Medicare Other

## 2022-11-05 DIAGNOSIS — R072 Precordial pain: Secondary | ICD-10-CM | POA: Diagnosis not present

## 2022-11-05 DIAGNOSIS — R9431 Abnormal electrocardiogram [ECG] [EKG]: Secondary | ICD-10-CM | POA: Diagnosis not present

## 2022-11-08 DIAGNOSIS — G8929 Other chronic pain: Secondary | ICD-10-CM | POA: Diagnosis not present

## 2022-11-08 DIAGNOSIS — I10 Essential (primary) hypertension: Secondary | ICD-10-CM | POA: Diagnosis not present

## 2022-11-08 DIAGNOSIS — I4891 Unspecified atrial fibrillation: Secondary | ICD-10-CM | POA: Diagnosis not present

## 2022-11-08 DIAGNOSIS — K219 Gastro-esophageal reflux disease without esophagitis: Secondary | ICD-10-CM | POA: Diagnosis not present

## 2022-11-08 DIAGNOSIS — E782 Mixed hyperlipidemia: Secondary | ICD-10-CM | POA: Diagnosis not present

## 2022-11-08 DIAGNOSIS — J449 Chronic obstructive pulmonary disease, unspecified: Secondary | ICD-10-CM | POA: Diagnosis not present

## 2022-11-12 ENCOUNTER — Ambulatory Visit: Payer: Medicare Other

## 2022-11-12 DIAGNOSIS — R072 Precordial pain: Secondary | ICD-10-CM

## 2022-11-12 DIAGNOSIS — R9431 Abnormal electrocardiogram [ECG] [EKG]: Secondary | ICD-10-CM

## 2022-11-13 NOTE — Progress Notes (Signed)
Gave patient results, she acknowledged understanding and had no further questions. Also reminded her on her 11/20/2022 appt.

## 2022-11-20 ENCOUNTER — Ambulatory Visit: Payer: Medicare Other | Admitting: Cardiology

## 2022-11-20 ENCOUNTER — Encounter: Payer: Self-pay | Admitting: Cardiology

## 2022-11-20 VITALS — BP 126/71 | HR 53 | Resp 16 | Ht 61.0 in | Wt 143.8 lb

## 2022-11-20 DIAGNOSIS — I10 Essential (primary) hypertension: Secondary | ICD-10-CM | POA: Diagnosis not present

## 2022-11-20 DIAGNOSIS — Z7901 Long term (current) use of anticoagulants: Secondary | ICD-10-CM | POA: Diagnosis not present

## 2022-11-20 DIAGNOSIS — I252 Old myocardial infarction: Secondary | ICD-10-CM | POA: Diagnosis not present

## 2022-11-20 DIAGNOSIS — I480 Paroxysmal atrial fibrillation: Secondary | ICD-10-CM | POA: Diagnosis not present

## 2022-11-20 DIAGNOSIS — R42 Dizziness and giddiness: Secondary | ICD-10-CM | POA: Diagnosis not present

## 2022-11-20 DIAGNOSIS — I2510 Atherosclerotic heart disease of native coronary artery without angina pectoris: Secondary | ICD-10-CM | POA: Diagnosis not present

## 2022-11-20 DIAGNOSIS — Z87891 Personal history of nicotine dependence: Secondary | ICD-10-CM

## 2022-11-20 DIAGNOSIS — R072 Precordial pain: Secondary | ICD-10-CM

## 2022-11-20 DIAGNOSIS — E7800 Pure hypercholesterolemia, unspecified: Secondary | ICD-10-CM | POA: Diagnosis not present

## 2022-11-20 DIAGNOSIS — R9431 Abnormal electrocardiogram [ECG] [EKG]: Secondary | ICD-10-CM

## 2022-11-20 NOTE — Progress Notes (Signed)
ID:  Kristen Wilson, MRN [id number]  PCP:  Kristen Wilson  Cardiologist:  Kristen Wilson, [facility] (established care 02/14/2022) Former Cardiology Providers: Dr. Yates DecampJay Wilson, Kristen Wilson, [place]  Date: 11/20/22 Last Office Visit: 10/11/2022  Chief Complaint  Patient presents with   Follow-up    Reevaluation of chest pain    HPI  Kristen Wilson is a 72 y.o. Caucasian female whose past medical history and cardiovascular risk factors include: Paroxysmal atrial fibrillation, moderate nonobstructive CAD, hypertension, hyperlipidemia, GERD, IBS, chronic palpitations, former smoker (quit August 2023), postmenopausal female.  Patient presents today for reevaluation of chest pain.  At last office visit she was describing precordial discomfort which is predominantly noncardiac but given her history of moderate nonobstructive CAD and multiple risk factors that shared decision was to proceed with echo and stress test.  Results reviewed with her in detail and noted below for further reference.  Clinically, patient states that the precordial discomfort has improved but still at times located over the left breast.  Pain is not brought on by effort related activities and does not resolve with rest.  With regards to her paroxysmal atrial fibrillation she is currently on metoprolol for rate control strategy and Eliquis for thromboembolic prophylaxis.  She does not endorse evidence of bleeding.  ALLERGIES: Allergies  Allergen Reactions   Latex Hives   Nsaids     Aleve / Ibuprofen  - causes Blood Pressure to rise   Percocet [Oxycodone-Acetaminophen] Nausea And Vomiting    MEDICATION LIST PRIOR TO VISIT: Current Meds  Medication Sig   albuterol (PROVENTIL HFA;VENTOLIN HFA) 108 (90 BASE) MCG/ACT inhaler Inhale 2 puffs into the lungs every 6 (six) hours as needed for wheezing or shortness of breath.   ALPRAZolam (XANAX) 0.5 MG tablet Take 0.5 mg by mouth daily as needed for anxiety.    apixaban (ELIQUIS) 5 MG TABS tablet Take 1 tablet (5 mg total) by mouth 2 (two) times daily.   Cholecalciferol (VITAMIN D3) 2000 units TABS Take 2,000 Units by mouth daily.   Cyanocobalamin (VITAMIN B-12 PO) Take 1 tablet by mouth daily.   docusate sodium (COLACE) 100 MG capsule 1 tab 2 times a day while on narcotics.  STOOL SOFTENER   DULoxetine (CYMBALTA) 60 MG capsule Take 120 mg by mouth 2 (two) times daily.   hydrochlorothiazide (HYDRODIURIL) 25 MG tablet Take 25 mg by mouth daily.   irbesartan (AVAPRO) 300 MG tablet Take 300 mg by mouth daily.   metoprolol tartrate (LOPRESSOR) 50 MG tablet Take 1 tablet (50 mg total) by mouth 2 (two) times daily.   Multiple Vitamin (MULTIVITAMIN) tablet Take 1 tablet by mouth daily.   pantoprazole (PROTONIX) 40 MG tablet Take 40 mg by mouth daily.    polyethylene glycol (MIRALAX / GLYCOLAX) packet 17grams in 16 oz of water twice a day until bowel movement.  LAXITIVE.  Restart if two days since last bowel movement     PAST MEDICAL HISTORY: Past Medical History:  Diagnosis Date   Allergy    Anginal pain    hx- non recent - r/t anxiety/panic attack   Anxiety    Arthritis    "Whole body"   Bronchitis    uses inhaler prn   Cough    smoker s cough   Depression    Diverticulitis    GERD (gastroesophageal reflux disease)    Hiatal hernia    Hyperlipidemia    Hypertension    IBS (irritable bowel syndrome)  Osteoporosis    Paroxysmal A-fib    Pneumonia    hx   Primary localized osteoarthritis of left knee    Shortness of breath    occ   Smoker    SVD (spontaneous vaginal delivery)    x 2   Upper respiratory tract infection 06/14/2015   Starting IV Rocephin today also starting nebulizers for wheezing    PAST SURGICAL HISTORY: Past Surgical History:  Procedure Laterality Date   CHOLECYSTECTOMY     COLONOSCOPY N/A 03/20/2013   Procedure: COLONOSCOPY;  Surgeon: Kristen ShiverSalem F Ganem, Wilson;  Location: [facility];  Service: Endoscopy;   Laterality: N/A;   CYST EXCISION PERINEAL  03/20/2018   Procedure: INCISION AND DRAINAGE OF PERINEAL CYST;  Surgeon: Kristen Mackieaavon, Kristen Wilson;  Location: [facility];  Service: Gynecology;;   DILATION AND CURETTAGE OF UTERUS     KNEE ARTHROSCOPY     left   LEFT HEART CATH AND CORONARY ANGIOGRAPHY N/A 02/14/2022   Procedure: LEFT HEART CATH AND CORONARY ANGIOGRAPHY;  Surgeon: Kristen DecampGanji, Jay, Wilson;  Location: [facility];  Service: Cardiovascular;  Laterality: N/A;   TONSILLECTOMY     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   Procedure: LEFT TOTAL KNEE ARTHROPLASTY;  Surgeon: Kristen Marvelobert Wainer, Wilson;  [facility];  Service: Orthopedics;  Laterality: Left;   TUBAL LIGATION     UPPER GI ENDOSCOPY     reflux, hiatal hernia   VULVECTOMY N/A 03/20/2018   Procedure: WIDE EXCISION VULVECTOMY;  Surgeon: Kristen Mackieaavon, Kristen Wilson;  Location: [facility];  Service: Gynecology;  Laterality: N/A;   WISDOM TOOTH EXTRACTION      FAMILY HISTORY: The patient family history includes Anxiety disorder in her sister and sister; Arthritis in her mother; Cancer in her father and mother; Diabetes in her mother; Other in her sister and sister.  SOCIAL HISTORY:  The patient  reports that she quit smoking about 9 months ago. Her smoking use included cigarettes. She has a 60.00 pack-year smoking history. She has never used smokeless tobacco. She reports that she does not drink alcohol and does not use drugs.  REVIEW OF SYSTEMS: Review of Systems  Cardiovascular:  Positive for chest pain (Improved compared to prior visit, see HPI). Negative for claudication, dyspnea on exertion, irregular heartbeat, leg swelling, near-syncope, orthopnea, palpitations, paroxysmal nocturnal dyspnea and syncope.  Respiratory:  Negative for shortness of breath.   Hematologic/Lymphatic: Negative for bleeding problem.  Musculoskeletal:  Negative for muscle cramps and myalgias.  Gastrointestinal:  Positive for hemorrhoids (Rare episodes of hemorrhoidal bleeding).   Neurological:  Negative for dizziness and light-headedness.    PHYSICAL EXAM:    11/20/2022   11:42 AM 10/11/2022   11:38 AM 04/12/2022   10:09 AM  Vitals with BMI  Height 5\' 1"  5\' 1"  5\' 1"   Weight 143 lbs 13 oz 146 lbs 13 oz 142 lbs  BMI 27.18 27.75 26.84  Systolic 126 101 409120  Diastolic 71 71 68  Pulse 53 61 59    Physical Exam  Constitutional: No distress.  Age appropriate, hemodynamically stable, walks w/ cane  Neck: No JVD present.  Cardiovascular: Normal rate, regular rhythm, S1 normal, S2 normal and intact distal pulses. Exam reveals no gallop, no S3 and no S4.  Murmur heard. Systolic murmur is present with a grade of 3/6 at the lower left sternal border. Pulses:      Dorsalis pedis pulses are 1+ on the right side and 1+ on the left side.  Posterior tibial pulses are 0 on the right side and 0 on the left side.  Pulmonary/Chest: Effort normal and breath sounds normal. No stridor. She has no wheezes. She has no rales.  Abdominal: Soft. Bowel sounds are normal. She exhibits no distension. There is no abdominal tenderness.  Musculoskeletal:        General: No edema.     Cervical back: Neck supple.  Neurological: She is alert and oriented to person, place, and time. She has intact cranial nerves (2-12).  Skin: Skin is warm and moist.   CARDIAC DATABASE: EKG: 11/20/2022: Sinus bradycardia, 51 bpm, nonspecific T wave abnormality.  Echocardiogram: 11/12/2022:  Left ventricle cavity is normal in size and wall thickness. Normal global wall motion. Normal LV systolic function with EF 65%. Normal diastolic filling pattern.  Aneurysmal interatrial septum without 2D or color Doppler evidence of shunting.  Structurally normal trileaflet aortic valve.  Mild (Grade I) aortic regurgitation.  No evidence of pulmonary hypertension.  No significant change compared to previous study on 03/21/2022.    Stress Testing: Lexiscan Tetrofosmin stress test 11/05/2022: 1 Day Rest/Stress  protocol.  Stress EKG is non-diagnostic baseline ST-T changes and target heart rate not achieved. Normal myocardial perfusion without reversible ischemia or prior infarct. Calculated LVEF 68%, wall motion preserved, without regional wall motion abnormalities. No prior studies available for comparison. Low risk study.  Heart Catheterization: Left Heart Catheterization 02/14/22:  LV: 132/6, EDP 19 mmHg.  Ao 164/71, mean 108 mmHg.  No pressure gradient across the aortic valve. LVEF normal at 55 to 60%.  No significant mitral regurgitation. LM: Large vessel, mild calcification evident. LAD: Large vessel, moderate diffuse coronary calcifications noted throughout the proximal and mid segment of the LAD.  There is a diffuse 30% calcific stenosis in the mid LAD after the origin of septal perforator 1.  D1 is large and has a ostial 50% stenosis.  Otherwise the LAD has mild diffuse disease distally. CX: Large vessel, has mild to moderate amount of coronary calcification in the proximal and mid segment.  There is mild diffuse disease, small AV groove circumflex, large OM1 with mild disease. RCA: Large-caliber vessel.  Again mild proximal coronary calcification is evident.  Mild luminal irregularities present.  Impression: The troponin elevation is related to demand ischemia and in the absence of high-grade coronary stenosis, normal LVEF and normal EKG, Wilson not suspect non-ST elevation myocardial infarction.  ABI 03/21/2022:  This exam reveals normal perfusion of the right lower extremity (ABI 1.15) with mildly abnormal biphasic waveform at the ankle.    This exam reveals normal perfusion of the left lower extremity (ABI 1.20) with mildly abnormal biphasic waveform at the [facility] and severely abnormal monophasic waveform at the PT at the level of the ankle.   LABORATORY DATA:    Latest Ref Rng & Units 02/14/2022    5:30 AM 02/13/2022    4:15 PM 10/31/2020    2:57 PM  CBC  WBC 4.0 - 10.5 K/uL 14.7  19.4  6.9    Hemoglobin 12.0 - 15.0 g/dL 78.2  95.6  21.3   Hematocrit 36.0 - 46.0 % 42.4  44.9  46.1   Platelets 150 - 400 K/uL 159  205  164        Latest Ref Rng & Units 02/14/2022    5:30 AM 02/13/2022    4:15 PM 10/31/2020    2:57 PM  CMP  Glucose 70 - 99 mg/dL 086  578  97   BUN 8 -  23 mg/dL 15  14  9    Creatinine 0.44 - 1.00 mg/dL  5.10  2.58   Sodium 135 - 145 mmol/L 135  134  139   Potassium 3.5 - 5.1 mmol/L 3.6  3.2  3.5   Chloride 98 - 111 mmol/L 100  97  97   CO2 22 - 32 mmol/L 28  28  32   Calcium 8.9 - 10.3 mg/dL 7.8  8.0  9.2   Total Protein 6.5 - 8.1 g/dL 4.2   6.7   Total Bilirubin 0.3 - 1.2 mg/dL 0.7   0.9   Alkaline Phos 38 - 126 U/L 56   75   AST 15 - 41 U/L 25   32   ALT 0 - 44 U/L 30   28     Lipid Panel     Component Value Date/Time   CHOL 78 02/14/2022 0530   TRIG 67 02/14/2022 0530   HDL 32 (L) 02/14/2022 0530   CHOLHDL 2.4 02/14/2022 0530   VLDL 13 02/14/2022 0530   LDLCALC 33 02/14/2022 0530    No components found for: "NTPROBNP" No results for input(s): "PROBNP" in the last 8760 hours. Recent Labs    02/13/22 1615  TSH 0.781    BMP Recent Labs    02/13/22 1615 02/14/22 0530  NA 134* 135  K 3.2* 3.6  CL 97* 100  CO2 28 28  GLUCOSE 156* 122*  BUN 14 15  CREATININE 1.01* 0.79  CALCIUM 8.0* 7.8*  GFRNONAA 60* >60    HEMOGLOBIN A1C Lab Results  Component Value Date   HGBA1C 5.5 02/13/2022   MPG 111.15 02/13/2022   RESULTS Component Value Reference Range Notes  CBC with Diff Reviewed date:03/18/2022 10:52:31 AM Interpretation:Normal Performing Lab: Notes/Report: Testing Performed at: 03/20/2022, [address], [address], [place], [place]  WBC 8.1 4.0-11.0 K/ul    RBC 4.49 4.20-5.40 M/uL    HGB 14.2 12.0-16.0 g/dL    HCT 12-30-1993 35.3 %    MCV 92.5 81.0-99.0 fL    MCH 31.6 27.0-33.0 pg    MCHC 34.2 32.0-36.0 g/dL    RDW 02-13-1987 54.0 %    PLT 165 150-400 K/uL    MPV 10.7 7.5-10.7 fL    NE% 66.0 43.3-71.9 %     LY% 23.5 16.8-43.5 %    MO% 7.2 4.6-12.4 %    EO% 2.5 0.0-7.8 %    BA% 0.8 0.0-1.0 %    NE# 5.3 1.9-7.2 K/uL    LY# 1.90 1.10-2.70 K/uL    MO# 0.6 0.3-0.8 K/uL    EO# 0.2 0.0-0.6 K/uL    BA# 0.1 0.0-0.1 K/uL    NRBC% 0.10      NRBC# 0.01      [birth year]1982 ANKLE/BRACHIAL INDICES BILAT Reviewed date:03/28/2022 05:20:47 PM Interpretation:Normal Performing Lab: Notes/Report: Normal  Transthoracic Echocardiogram Reviewed date:03/28/2022 05:22:00 PM Interpretation:Normal structure with SEF 65% Performing Lab: Notes/Report: Normal structure with SEF 65%  Sed Rate (ESR) Reviewed date:04/27/2022 06:21:09 PM Interpretation:normal Performing Lab: Notes/Report: Testing Performed at: 06/27/2022, [address], [address], [place], [place]  ESR <1 0-20 mm/HR    Vitamin D 25(OH) Total Reviewed date:10/21/2021 02:09:26 PM Interpretation: Low Performing Lab: Notes/Report: Testing Performed at: 10/23/2021, [address], [address], [place], [place]  25(OH) Vit D, Total 29.9 30.0-100.0 ng/mL    Vitamin B12 Reviewed date:10/21/2021 02:09:26 PM Interpretation: Low Performing Lab: Notes/Report: Testing Performed at: 10/23/2021, [address],  [address], [place], [place] [address]  B12 172 180-914 pg/mL    Lipid Panel w/reflex Reviewed date:10/21/2021 02:09:26 PM Interpretation:At goal Performing Lab: Notes/Report: Testing Performed at: [facility], [address], [place], [place] [address]  Cholesterol 100 <200 mg/dL    CHOL/HDL 3.7 9.5-1.8 Ratio    HDLD 27 30-85 mg/dL Values below 40 mg/dL indicate increased risk factor  Triglyceride 132 0-199 mg/dL    NHDL 73 8-416 mg/dL Range dependent upon risk factors.  LDL Chol Calc (NIH) 49 0-99 mg/dL    Comp Metabolic Panel Reviewed date:10/21/2021 02:09:26 PM Interpretation:Normal Performing Lab: Notes/Report: Testing Performed at: [facility], [address], [address], [place], [place] [address]  Glucose  92 70-99 mg/dL    BUN 9 1-60 mg/dL    Creatinine 1.09 3.23-5.57 mg/dl    [id number] 72 >70 calc In accordance with recommendations from NKF-ASN Task Force, [facility] has updated its eGFR calc to the 2021 CKD-EDI equation that estimates kidney function without a race variable;Stage 1 > 90 ML/Min plus Albuminuria;Stage 2 60-89 ML/MIN;Stage 3 30-59 ML/MIN;Stage 4 15-29 ML/MIN;Stage 5 <15 ML/MIN  Sodium 139 136-145 mmol/L    Potassium 3.5 3.5-5.5 mmol/L    Chloride 99 98-107 mmol/L    CO2 38 22-32 mmol/L    Anion Gap 6.7 6.0-20.0 mmol/L    Calcium 9.2 8.6-10.3 mg/dL    CA-corrected 6.23 7.62-83.15 mg/dL    Protein, Total 6.0 1.7-6.1 g/dL    Albumin 3.9 6.0-7.3 g/dL    TBIL 0.7 7.1-0.6 mg/dL    ALP 83 26-948 U/L    AST 17 0-39 U/L    ALT 15 0-52 U/L       IMPRESSION:    ICD-10-CM   1. Precordial pain  R07.2 EKG 12-Lead    2. Abnormal EKG  R94.31     3. Paroxysmal atrial fibrillation (HCC)  I48.0 EKG 12-Lead    Basic metabolic panel    4. Long term (current) use of anticoagulants  Z79.01 Basic metabolic panel    5. Nonobstructive atherosclerosis of coronary artery  I25.10     6. Coronary atherosclerosis due to calcified coronary lesion  I25.10    I25.84     7. Hx of non-ST elevation myocardial infarction (NSTEMI)  I25.2     8. Benign hypertension  I10     9. Pure hypercholesterolemia  E78.00     10. Former smoker  Z87.891        RECOMMENDATIONS: Kristen Wilson is a 72 y.o. Caucasian female whose past medical history and cardiac risk factors include: Paroxysmal atrial fibrillation, moderate nonobstructive CAD, hypertension, hyperlipidemia, GERD, IBS, chronic palpitations, former smoker (quit August 2023), postmenopausal female.  Precordial pain: Nonobstructive CAD Precordial discomfort improving. Echocardiogram notes preserved LVEF, no regional wall motion abnormalities, no significant valvular heart disease. MPI: Low risk study. Educated her on the importance of improving  her modifiable cardiovascular risk factors. Monitor for now. Educated on seeking medical attention sooner by going to the closest ER via EMS if the symptoms increase in intensity, frequency, duration, or has typical chest pain as discussed in the office.  Patient verbalized understanding.  Paroxysmal atrial fibrillation (HCC) Long term (current) use of anticoagulants Rate control: Metoprolol. Rhythm control: N/A. Thromboembolic prophylaxis: Eliquis. CHA2DS2-VASc SCORE is 4 which correlates to 4.8% risk of stroke per year (HTN, vascular, age, gender). Reemphasized the risks, benefits, and alternatives to oral anticoagulation. Patient does not endorse evidence of bleeding, with the exception of at times hemorrhoidal bleeding. Repeat H&H  and CMP are still pending.    Benign hypertension Office blood pressures are well-controlled. No changes warranted at this time.  Pure hypercholesterolemia No recent labs available for review. Will check a fasting lipid profile and direct LDL.  Continue current dose of statin therapy  Former smoker Quit August 2023. Reemphasized importance of continued complete smoking cessation.   FINAL MEDICATION LIST END OF ENCOUNTER: No orders of the defined types were placed in this encounter.   There are no discontinued medications.    Current Outpatient Medications:    albuterol (PROVENTIL HFA;VENTOLIN HFA) 108 (90 BASE) MCG/ACT inhaler, Inhale 2 puffs into the lungs every 6 (six) hours as needed for wheezing or shortness of breath., Disp: 1 Inhaler, Rfl: 2   ALPRAZolam (XANAX) 0.5 MG tablet, Take 0.5 mg by mouth daily as needed for anxiety., Disp: , Rfl:    apixaban (ELIQUIS) 5 MG TABS tablet, Take 1 tablet (5 mg total) by mouth 2 (two) times daily., Disp: 60 tablet, Rfl: 0   Cholecalciferol (VITAMIN D3) 2000 units TABS, Take 2,000 Units by mouth daily., Disp: , Rfl:    Cyanocobalamin (VITAMIN B-12 PO), Take 1 tablet by mouth daily., Disp: , Rfl:     docusate sodium (COLACE) 100 MG capsule, 1 tab 2 times a day while on narcotics.  STOOL SOFTENER, Disp: 60 capsule, Rfl: 0   DULoxetine (CYMBALTA) 60 MG capsule, Take 120 mg by mouth 2 (two) times daily., Disp: , Rfl:    hydrochlorothiazide (HYDRODIURIL) 25 MG tablet, Take 25 mg by mouth daily., Disp: , Rfl:    irbesartan (AVAPRO) 300 MG tablet, Take 300 mg by mouth daily., Disp: , Rfl:    metoprolol tartrate (LOPRESSOR) 50 MG tablet, Take 1 tablet (50 mg total) by mouth 2 (two) times daily., Disp: 60 tablet, Rfl: 1   Multiple Vitamin (MULTIVITAMIN) tablet, Take 1 tablet by mouth daily., Disp: , Rfl:    pantoprazole (PROTONIX) 40 MG tablet, Take 40 mg by mouth daily. , Disp: , Rfl:    polyethylene glycol (MIRALAX / GLYCOLAX) packet, 17grams in 16 oz of water twice a day until bowel movement.  LAXITIVE.  Restart if two days since last bowel movement, Disp: 60 each, Rfl: 0   rosuvastatin (CRESTOR) 20 MG tablet, Take 20 mg by mouth daily. (Patient not taking: Reported on 11/20/2022), Disp: , Rfl:   Orders Placed This Encounter  Procedures   EKG 12-Lead    Patient Instructions     --Continue cardiac medications as reconciled in final medication list. --Return in about 6 months (around 05/23/2023) for Follow up, A. fib. or sooner if needed. --Continue follow-up with your primary care physician regarding the management of your other chronic comorbid conditions.  Patient's questions and concerns were addressed to her satisfaction. She voices understanding of the instructions provided during this encounter.   This note was created using a voice recognition software as a result there may be grammatical errors inadvertently enclosed that Wilson not reflect the nature of this encounter. Every attempt is made to correct such errors.  Kristen Lerner, [place], [facility]  Pager:  [phone] Office: [phone]

## 2022-12-01 ENCOUNTER — Encounter: Payer: Self-pay | Admitting: Cardiology

## 2023-02-07 DIAGNOSIS — Z1231 Encounter for screening mammogram for malignant neoplasm of breast: Secondary | ICD-10-CM | POA: Diagnosis not present

## 2023-04-30 DIAGNOSIS — Z23 Encounter for immunization: Secondary | ICD-10-CM | POA: Diagnosis not present

## 2023-04-30 DIAGNOSIS — Z9181 History of falling: Secondary | ICD-10-CM | POA: Diagnosis not present

## 2023-04-30 DIAGNOSIS — Z0000 Encounter for general adult medical examination without abnormal findings: Secondary | ICD-10-CM | POA: Diagnosis not present

## 2023-05-15 NOTE — Telephone Encounter (Signed)
This encounter was created in error - please disregard.

## 2023-05-16 DIAGNOSIS — R0789 Other chest pain: Secondary | ICD-10-CM | POA: Diagnosis not present

## 2023-05-16 DIAGNOSIS — I10 Essential (primary) hypertension: Secondary | ICD-10-CM | POA: Diagnosis not present

## 2023-05-16 DIAGNOSIS — Z87891 Personal history of nicotine dependence: Secondary | ICD-10-CM | POA: Diagnosis not present

## 2023-05-16 DIAGNOSIS — E876 Hypokalemia: Secondary | ICD-10-CM | POA: Diagnosis not present

## 2023-05-16 DIAGNOSIS — I499 Cardiac arrhythmia, unspecified: Secondary | ICD-10-CM | POA: Diagnosis not present

## 2023-05-16 DIAGNOSIS — Z7901 Long term (current) use of anticoagulants: Secondary | ICD-10-CM | POA: Diagnosis not present

## 2023-05-16 DIAGNOSIS — R079 Chest pain, unspecified: Secondary | ICD-10-CM | POA: Diagnosis not present

## 2023-05-16 DIAGNOSIS — R9431 Abnormal electrocardiogram [ECG] [EKG]: Secondary | ICD-10-CM | POA: Diagnosis not present

## 2023-05-17 DIAGNOSIS — R079 Chest pain, unspecified: Secondary | ICD-10-CM | POA: Diagnosis not present

## 2023-05-17 DIAGNOSIS — R0789 Other chest pain: Secondary | ICD-10-CM | POA: Diagnosis not present

## 2023-05-24 ENCOUNTER — Ambulatory Visit: Payer: Medicare Other | Admitting: Cardiology

## 2023-05-29 DIAGNOSIS — E782 Mixed hyperlipidemia: Secondary | ICD-10-CM | POA: Diagnosis not present

## 2023-05-29 DIAGNOSIS — I10 Essential (primary) hypertension: Secondary | ICD-10-CM | POA: Diagnosis not present

## 2023-05-29 DIAGNOSIS — Z79899 Other long term (current) drug therapy: Secondary | ICD-10-CM | POA: Diagnosis not present

## 2023-05-30 DIAGNOSIS — M1711 Unilateral primary osteoarthritis, right knee: Secondary | ICD-10-CM | POA: Diagnosis not present

## 2023-06-03 ENCOUNTER — Ambulatory Visit: Payer: Medicare Other | Attending: Cardiology | Admitting: Cardiology

## 2023-06-03 ENCOUNTER — Encounter: Payer: Self-pay | Admitting: Cardiology

## 2023-06-03 VITALS — BP 134/80 | HR 58 | Resp 16 | Ht 61.0 in | Wt 139.6 lb

## 2023-06-03 DIAGNOSIS — I252 Old myocardial infarction: Secondary | ICD-10-CM

## 2023-06-03 DIAGNOSIS — Z87891 Personal history of nicotine dependence: Secondary | ICD-10-CM

## 2023-06-03 DIAGNOSIS — I2510 Atherosclerotic heart disease of native coronary artery without angina pectoris: Secondary | ICD-10-CM

## 2023-06-03 DIAGNOSIS — I480 Paroxysmal atrial fibrillation: Secondary | ICD-10-CM

## 2023-06-03 DIAGNOSIS — I2584 Coronary atherosclerosis due to calcified coronary lesion: Secondary | ICD-10-CM

## 2023-06-03 DIAGNOSIS — E7800 Pure hypercholesterolemia, unspecified: Secondary | ICD-10-CM

## 2023-06-03 DIAGNOSIS — Z7901 Long term (current) use of anticoagulants: Secondary | ICD-10-CM | POA: Diagnosis not present

## 2023-06-03 DIAGNOSIS — I10 Essential (primary) hypertension: Secondary | ICD-10-CM

## 2023-06-03 MED ORDER — ATORVASTATIN CALCIUM 20 MG PO TABS: 20.0000 mg | ORAL_TABLET | Freq: Every day | ORAL | 3 refills | Status: AC

## 2023-06-03 NOTE — Progress Notes (Signed)
Cardiology Office Note:  .   Date:  06/03/2023  ID:  Kristen Wilson, DOB 04/13/1951, MRN [id number] PCP:  Gweneth Dimitri, MD  Former Cardiology Providers: Dr. Yates Decamp, Elvin So, PA  [facility] Providers Cardiologist:  Tessa Lerner, DO , [facility] (established care 02/14/2022 ) Electrophysiologist:  None  Click to update primary MD,subspecialty MD or APP then REFRESH:1}    History of Present Illness: Kristen Wilson   Kristen Wilson is a 72 y.o. Caucasian female whose past medical history and cardiovascular risk factors includes: Paroxysmal atrial fibrillation, moderate nonobstructive CAD, hypertension, hyperlipidemia, GERD, IBS, chronic palpitations, former smoker (quit in August 2023), postmenopausal female.  Patient is being followed by the practice for her paroxysmal atrial fibrillation and nonobstructive CAD.  Patient presents today for 78-month follow-up visit.  She denies anginal chest pain or heart failure symptoms.  However she has noticed a few more episodes of A-fib (subjectively).  And this was during her vacation and she had forgot to take her medications.  When she got back on the medications the frequency of A-fib/palpitations/elevated heart rate has improved.  She had labs with PCP in October 2024 results reviewed via [facility] database.  She does not endorse any evidence of bleeding.   Review of Systems: .   Review of Systems  Cardiovascular:  Negative for chest pain, claudication, dyspnea on exertion, irregular heartbeat, leg swelling, near-syncope, orthopnea, palpitations, paroxysmal nocturnal dyspnea and syncope.  Respiratory:  Negative for shortness of breath.   Hematologic/Lymphatic: Negative for bleeding problem.  Musculoskeletal:  Negative for muscle cramps and myalgias.  Neurological:  Negative for dizziness and light-headedness.    Studies Reviewed:   EKG: 11/20/2022: Sinus bradycardia, 51 bpm, nonspecific T wave abnormality.   Echocardiogram: 11/12/2022:  Left  ventricle cavity is normal in size and wall thickness. Normal global wall motion. Normal LV systolic function with EF 65%. Normal diastolic filling pattern.  Aneurysmal interatrial septum without 2D or color Doppler evidence of shunting.  Structurally normal trileaflet aortic valve.  Mild (Grade I) aortic regurgitation.  No evidence of pulmonary hypertension.  No significant change compared to previous study on 03/21/2022.    Stress Testing: Lexiscan Tetrofosmin stress test 11/05/2022: 1 Day Rest/Stress protocol.  Stress EKG is non-diagnostic baseline ST-T changes and target heart rate not achieved. Normal myocardial perfusion without reversible ischemia or prior infarct. Calculated LVEF 68%, wall motion preserved, without regional wall motion abnormalities. No prior studies available for comparison. Low risk study.   Heart Catheterization: Left Heart Catheterization 02/14/22:  LV: 132/6, EDP 19 mmHg.  Ao 164/71, mean 108 mmHg.  No pressure gradient across the aortic valve. LVEF normal at 55 to 60%.  No significant mitral regurgitation. LM: Large vessel, mild calcification evident. LAD: Large vessel, moderate diffuse coronary calcifications noted throughout the proximal and mid segment of the LAD.  There is a diffuse 30% calcific stenosis in the mid LAD after the origin of septal perforator 1.  D1 is large and has a ostial 50% stenosis.  Otherwise the LAD has mild diffuse disease distally. CX: Large vessel, has mild to moderate amount of coronary calcification in the proximal and mid segment.  There is mild diffuse disease, small AV groove circumflex, large OM1 with mild disease. RCA: Large-caliber vessel.  Again mild proximal coronary calcification is evident.  Mild luminal irregularities present.  Impression: The troponin elevation is related to demand ischemia and in the absence of high-grade coronary stenosis, normal LVEF and normal EKG, do not suspect non-ST elevation  myocardial  infarction.   ABI 03/21/2022:  This exam reveals normal perfusion of the right lower extremity (ABI 1.15) with mildly abnormal biphasic waveform at the ankle.    This exam reveals normal perfusion of the left lower extremity (ABI 1.20) with mildly abnormal biphasic waveform at the [facility] and severely abnormal monophasic waveform at the PT at the level of the ankle.   Risk Assessment/Calculations:   Click Here to Calculate/Change CHADS2VASc Score The patient's CHADS2-VASc score is 4, indicating a 4.8% annual risk of stroke.   CHF History: No HTN History: Yes Diabetes History: No Stroke History: No Vascular Disease History: Yes  Labs:       Latest Ref Rng & Units 02/14/2022    5:30 AM 02/13/2022    4:15 PM 10/31/2020    2:57 PM  CBC  WBC 4.0 - 10.5 K/uL 14.7  19.4  6.9   Hemoglobin 12.0 - 15.0 g/dL 78.2  95.6  21.3   Hematocrit 36.0 - 46.0 % 42.4  44.9  46.1   Platelets 150 - 400 K/uL 159  205  164        Latest Ref Rng & Units 02/14/2022    5:30 AM 02/13/2022    4:15 PM 10/31/2020    2:57 PM  BMP  Glucose 70 - 99 mg/dL 086  578  97   BUN 8 - 23 mg/dL 15  14  9    Creatinine 0.44 - 1.00 mg/dL 4.69  6.29  5.28   Sodium 135 - 145 mmol/L 135  134  139   Potassium 3.5 - 5.1 mmol/L 3.6  3.2  3.5   Chloride 98 - 111 mmol/L 100  97  97   CO2 22 - 32 mmol/L 28  28  32   Calcium 8.9 - 10.3 mg/dL 7.8  8.0  9.2       Latest Ref Rng & Units 02/14/2022    5:30 AM 02/13/2022    4:15 PM 10/31/2020    2:57 PM  CMP  Glucose 70 - 99 mg/dL 413  244  97   BUN 8 - 23 mg/dL 15  14  9    Creatinine 0.44 - 1.00 mg/dL 0.10  2.72  5.36   Sodium 135 - 145 mmol/L 135  134  139   Potassium 3.5 - 5.1 mmol/L 3.6  3.2  3.5   Chloride 98 - 111 mmol/L 100  97  97   CO2 22 - 32 mmol/L 28  28  32   Calcium 8.9 - 10.3 mg/dL 7.8  8.0  9.2   Total Protein 6.5 - 8.1 g/dL 4.2   6.7   Total Bilirubin 0.3 - 1.2 mg/dL 0.7   0.9   Alkaline Phos 38 - 126 U/L 56   75   AST 15 - 41 U/L 25   32   ALT 0 - 44 U/L 30   28      Lab Results  Component Value Date   CHOL 78 02/14/2022   HDL 32 (L) 02/14/2022   LDLCALC 33 02/14/2022   TRIG 67 02/14/2022   CHOLHDL 2.4 02/14/2022   No results for input(s): "LIPOA" in the last 8760 hours. No components found for: "NTPROBNP" No results for input(s): "PROBNP" in the last 8760 hours. No results for input(s): "TSH" in the last 8760 hours.  External Labs: Collected: May 29, 2023: Total cholesterol 180, triglycerides 146, HDL 32, LDL calculated 122   Physical Exam:    Today's Vitals  06/03/23 0944  BP: 134/80  Pulse: (!) 58  Resp: 16  SpO2: 97%  Weight: 139 lb 9.6 oz (63.3 kg)  Height: 5\' 1"  (1.549 m)   Body mass index is 26.38 kg/m. Wt Readings from Last 3 Encounters:  06/03/23 139 lb 9.6 oz (63.3 kg)  11/20/22 143 lb 12.8 oz (65.2 kg)  10/11/22 146 lb 12.8 oz (66.6 kg)    Physical Exam  Constitutional: No distress.  Age appropriate, hemodynamically stable, walks w/ cane  Neck: No JVD present.  Cardiovascular: Normal rate, regular rhythm, S1 normal, S2 normal and intact distal pulses. Exam reveals no gallop, no S3 and no S4.  Murmur heard. Systolic murmur is present with a grade of 3/6 at the lower left sternal border. Pulses:      Dorsalis pedis pulses are 1+ on the right side and 1+ on the left side.       Posterior tibial pulses are 0 on the right side and 0 on the left side.  Pulmonary/Chest: Effort normal and breath sounds normal. No stridor. She has no wheezes. She has no rales.  Abdominal: Soft. Bowel sounds are normal. She exhibits no distension. There is no abdominal tenderness.  Musculoskeletal:        General: No edema.     Cervical back: Neck supple.  Neurological: She is alert and oriented to person, place, and time. She has intact cranial nerves (2-12).  Skin: Skin is warm and moist.     Impression & Recommendation(s):  Impression:   ICD-10-CM   1. Paroxysmal atrial fibrillation (HCC)  I48.0 EKG 12-Lead    CBC     Basic metabolic panel    Lipid panel    Direct LDL    2. Long term (current) use of anticoagulants  Z79.01 CBC    Basic metabolic panel    Lipid panel    Direct LDL    3. Nonobstructive atherosclerosis of coronary artery  I25.10 CBC    Basic metabolic panel    Lipid panel    Direct LDL    4. Coronary atherosclerosis due to calcified coronary lesion  I25.10 CBC   I25.84 Basic metabolic panel    Lipid panel    Direct LDL    5. Hx of non-ST elevation myocardial infarction (NSTEMI)  I25.2 CBC    Basic metabolic panel    Lipid panel    Direct LDL    6. Benign hypertension  I10 CBC    Basic metabolic panel    Lipid panel    Direct LDL    7. Pure hypercholesterolemia  E78.00 CBC    Basic metabolic panel    Lipid panel    Direct LDL    8. Former smoker  Z87.891 CBC    Basic metabolic panel    Lipid panel    Direct LDL       Recommendation(s):  Paroxysmal atrial fibrillation (HCC) Long term (current) use of anticoagulants Rate control: Lopressor 50 mg p.o. twice daily. Rhythm control: N/A. Thromboembolic prophylaxis: Eliquis. CHA2DS2-VASc score as discussed above. Will provide information with regards to Eliquis patient assistance Hemoglobin is stable. She is aware to have her hemoglobin checked every 6 months since she is on anticoagulation. Does not endorse evidence of bleeding.  Nonobstructive atherosclerosis of coronary artery Coronary atherosclerosis due to calcified coronary lesion Hx of non-ST elevation myocardial infarction (NSTEMI) Denies anginal chest pain or heart failure symptoms. Echocardiogram: Preserved LVEF, no regional wall motion abnormalities, see report for additional details. MPI:  Low risk study. Reemphasized the importance of secondary prevention with focus on improving her modifiable cardiovascular risk factors such as glycemic control, lipid management, blood pressure control, weight loss.  Benign hypertension Office blood pressures are at  goal. Medications reconciled. No changes warranted at this time.  Pure hypercholesterolemia Currently on atorvastatin. Her LDL levels are currently not at goal. Will increase atorvastatin from 10 mg p.o. nightly to 20 mg p.o. nightly. Fasting lipids in 6 weeks to reevaluate therapy and liver function She denies myalgia or other side effects.  Orders Placed:  Orders Placed This Encounter  Procedures   CBC    Standing Status:   Future    Standing Expiration Date:   06/02/2024   Basic metabolic panel    Standing Status:   Future    Standing Expiration Date:   06/02/2024   Lipid panel    Standing Status:   Future    Standing Expiration Date:   06/02/2024   Direct LDL    Standing Status:   Future    Standing Expiration Date:   06/02/2024   EKG 12-Lead    As part of medical decision making results of the echo, heart catheterization results, outside labs from October 2024 via [facility] database were reviewed independently at today's visit.   Final Medication List:    Meds ordered this encounter  Medications   atorvastatin (LIPITOR) 20 MG tablet    Sig: Take 1 tablet (20 mg total) by mouth daily.    Dispense:  90 tablet    Refill:  3    Medications Discontinued During This Encounter  Medication Reason   rosuvastatin (CRESTOR) 20 MG tablet Discontinued by provider     Current Outpatient Medications:    albuterol (PROVENTIL HFA;VENTOLIN HFA) 108 (90 BASE) MCG/ACT inhaler, Inhale 2 puffs into the lungs every 6 (six) hours as needed for wheezing or shortness of breath., Disp: 1 Inhaler, Rfl: 2   ALPRAZolam (XANAX) 0.5 MG tablet, Take 0.5 mg by mouth daily as needed for anxiety., Disp: , Rfl:    apixaban (ELIQUIS) 5 MG TABS tablet, Take 1 tablet (5 mg total) by mouth 2 (two) times daily., Disp: 60 tablet, Rfl: 0   atorvastatin (LIPITOR) 20 MG tablet, Take 1 tablet (20 mg total) by mouth daily., Disp: 90 tablet, Rfl: 3   Cholecalciferol (VITAMIN D3) 2000 units TABS, Take 2,000 Units by  mouth daily., Disp: , Rfl:    Cyanocobalamin (VITAMIN B-12 PO), Take 1 tablet by mouth daily., Disp: , Rfl:    docusate sodium (COLACE) 100 MG capsule, 1 tab 2 times a day while on narcotics.  STOOL SOFTENER, Disp: 60 capsule, Rfl: 0   DULoxetine (CYMBALTA) 60 MG capsule, Take 120 mg by mouth 2 (two) times daily., Disp: , Rfl:    hydrochlorothiazide (HYDRODIURIL) 25 MG tablet, Take 25 mg by mouth daily., Disp: , Rfl:    irbesartan (AVAPRO) 300 MG tablet, Take 300 mg by mouth daily., Disp: , Rfl:    metoprolol tartrate (LOPRESSOR) 50 MG tablet, Take 1 tablet (50 mg total) by mouth 2 (two) times daily., Disp: 60 tablet, Rfl: 1   Multiple Vitamin (MULTIVITAMIN) tablet, Take 1 tablet by mouth daily., Disp: , Rfl:    pantoprazole (PROTONIX) 40 MG tablet, Take 40 mg by mouth daily. , Disp: , Rfl:    polyethylene glycol (MIRALAX / GLYCOLAX) packet, 17grams in 16 oz of water twice a day until bowel movement.  LAXITIVE.  Restart if two days since last bowel  movement, Disp: 60 each, Rfl: 0  Consent:      NA  Disposition:   Return in about 6 months (around 12/02/2023) for A. fib, CAD. or sooner if needed.  Her questions and concerns were addressed to her satisfaction. She voices understanding of the recommendations provided during this encounter.    Signed, Tessa Lerner, DO, [facility]  [facility]  [address] [place], [place] [address] [phone] 06/03/2023 1:18 PM

## 2023-06-03 NOTE — Patient Instructions (Addendum)
Medication Instructions:  START Atorvastation (Lipitor) 20 mg once daily  *If you need a refill on your cardiac medications before your next appointment, please call your pharmacy*   Lab Work: BMET, CBC, Lipid panel, and direct LDL in 6 weeks If you have labs (blood work) drawn today and your tests are completely normal, you will receive your results only by: MyChart Message (if you have MyChart) OR A paper copy in the mail If you have any lab test that is abnormal or we need to change your treatment, we will call you to review the results.   Follow-Up: At [facility], you and your health needs are our priority.  As part of our continuing mission to provide you with exceptional heart care, we have created designated Provider Care Teams.  These Care Teams include your primary Cardiologist (physician) and Advanced Practice Providers (APPs -  Physician Assistants and Nurse Practitioners) who all work together to provide you with the care you need, when you need it.  We recommend signing up for the patient portal called "MyChart".  Sign up information is provided on this After Visit Summary.  MyChart is used to connect with patients for Virtual Visits (Telemedicine).  Patients are able to view lab/test results, encounter notes, upcoming appointments, etc.  Non-urgent messages can be sent to your provider as well.   To learn more about what you can do with MyChart, go to [email or web address].    Your next appointment:   6 month(s)  Provider:   Dr. Odis Hollingshead

## 2023-06-18 ENCOUNTER — Other Ambulatory Visit: Payer: Self-pay

## 2023-06-18 ENCOUNTER — Telehealth: Payer: Self-pay

## 2023-06-18 DIAGNOSIS — Z122 Encounter for screening for malignant neoplasm of respiratory organs: Secondary | ICD-10-CM

## 2023-06-18 DIAGNOSIS — Z87891 Personal history of nicotine dependence: Secondary | ICD-10-CM

## 2023-06-18 NOTE — Telephone Encounter (Signed)
Left VM for patient to call for scheduling of annual LDCT

## 2023-06-26 NOTE — Telephone Encounter (Signed)
Letter mailed to home to call for scheduling annual LDCT

## 2023-07-08 DIAGNOSIS — K625 Hemorrhage of anus and rectum: Secondary | ICD-10-CM | POA: Diagnosis not present

## 2023-07-08 DIAGNOSIS — I480 Paroxysmal atrial fibrillation: Secondary | ICD-10-CM | POA: Diagnosis not present

## 2023-07-08 DIAGNOSIS — I739 Peripheral vascular disease, unspecified: Secondary | ICD-10-CM | POA: Diagnosis not present

## 2023-07-08 DIAGNOSIS — J432 Centrilobular emphysema: Secondary | ICD-10-CM | POA: Diagnosis not present

## 2023-07-08 DIAGNOSIS — K580 Irritable bowel syndrome with diarrhea: Secondary | ICD-10-CM | POA: Diagnosis not present

## 2023-07-08 DIAGNOSIS — I700 Atherosclerosis of aorta: Secondary | ICD-10-CM | POA: Diagnosis not present

## 2023-07-08 DIAGNOSIS — M791 Myalgia, unspecified site: Secondary | ICD-10-CM | POA: Diagnosis not present

## 2023-07-08 DIAGNOSIS — M25512 Pain in left shoulder: Secondary | ICD-10-CM | POA: Diagnosis not present

## 2023-07-08 DIAGNOSIS — I10 Essential (primary) hypertension: Secondary | ICD-10-CM | POA: Diagnosis not present

## 2023-07-08 DIAGNOSIS — D6869 Other thrombophilia: Secondary | ICD-10-CM | POA: Diagnosis not present

## 2023-07-08 DIAGNOSIS — M25511 Pain in right shoulder: Secondary | ICD-10-CM | POA: Diagnosis not present

## 2023-07-08 DIAGNOSIS — E782 Mixed hyperlipidemia: Secondary | ICD-10-CM | POA: Diagnosis not present

## 2023-07-12 DIAGNOSIS — M25512 Pain in left shoulder: Secondary | ICD-10-CM | POA: Diagnosis not present

## 2023-07-15 ENCOUNTER — Ambulatory Visit: Payer: Medicare Other

## 2023-07-15 DIAGNOSIS — I252 Old myocardial infarction: Secondary | ICD-10-CM

## 2023-07-15 DIAGNOSIS — I480 Paroxysmal atrial fibrillation: Secondary | ICD-10-CM

## 2023-07-15 DIAGNOSIS — I10 Essential (primary) hypertension: Secondary | ICD-10-CM

## 2023-07-15 DIAGNOSIS — I2510 Atherosclerotic heart disease of native coronary artery without angina pectoris: Secondary | ICD-10-CM

## 2023-07-15 DIAGNOSIS — E7800 Pure hypercholesterolemia, unspecified: Secondary | ICD-10-CM

## 2023-07-15 DIAGNOSIS — Z87891 Personal history of nicotine dependence: Secondary | ICD-10-CM

## 2023-07-15 DIAGNOSIS — Z7901 Long term (current) use of anticoagulants: Secondary | ICD-10-CM

## 2023-08-12 ENCOUNTER — Other Ambulatory Visit: Payer: Self-pay

## 2023-08-12 ENCOUNTER — Inpatient Hospital Stay (HOSPITAL_COMMUNITY)
Admission: EM | Admit: 2023-08-12 | Discharge: 2023-08-17 | DRG: 378 | Disposition: A | Discharge status: Home / Self Care | Payer: Medicare Other | Attending: Internal Medicine | Admitting: Internal Medicine

## 2023-08-12 ENCOUNTER — Encounter (HOSPITAL_COMMUNITY): Payer: Self-pay

## 2023-08-12 ENCOUNTER — Emergency Department (HOSPITAL_COMMUNITY): Payer: Medicare Other

## 2023-08-12 DIAGNOSIS — M810 Age-related osteoporosis without current pathological fracture: Secondary | ICD-10-CM | POA: Diagnosis not present

## 2023-08-12 DIAGNOSIS — Z7901 Long term (current) use of anticoagulants: Secondary | ICD-10-CM | POA: Diagnosis not present

## 2023-08-12 DIAGNOSIS — Z91040 Latex allergy status: Secondary | ICD-10-CM | POA: Diagnosis not present

## 2023-08-12 DIAGNOSIS — R5383 Other fatigue: Secondary | ICD-10-CM | POA: Diagnosis not present

## 2023-08-12 DIAGNOSIS — I119 Hypertensive heart disease without heart failure: Secondary | ICD-10-CM | POA: Diagnosis not present

## 2023-08-12 DIAGNOSIS — I480 Paroxysmal atrial fibrillation: Secondary | ICD-10-CM | POA: Diagnosis present

## 2023-08-12 DIAGNOSIS — K644 Residual hemorrhoidal skin tags: Secondary | ICD-10-CM | POA: Diagnosis present

## 2023-08-12 DIAGNOSIS — K5731 Diverticulosis of large intestine without perforation or abscess with bleeding: Principal | ICD-10-CM | POA: Diagnosis present

## 2023-08-12 DIAGNOSIS — K921 Melena: Secondary | ICD-10-CM | POA: Diagnosis not present

## 2023-08-12 DIAGNOSIS — R58 Hemorrhage, not elsewhere classified: Secondary | ICD-10-CM | POA: Diagnosis not present

## 2023-08-12 DIAGNOSIS — Z743 Need for continuous supervision: Secondary | ICD-10-CM | POA: Diagnosis not present

## 2023-08-12 DIAGNOSIS — I10 Essential (primary) hypertension: Secondary | ICD-10-CM | POA: Diagnosis present

## 2023-08-12 DIAGNOSIS — E875 Hyperkalemia: Secondary | ICD-10-CM | POA: Diagnosis not present

## 2023-08-12 DIAGNOSIS — D62 Acute posthemorrhagic anemia: Secondary | ICD-10-CM | POA: Diagnosis not present

## 2023-08-12 DIAGNOSIS — K649 Unspecified hemorrhoids: Secondary | ICD-10-CM | POA: Diagnosis not present

## 2023-08-12 DIAGNOSIS — K5730 Diverticulosis of large intestine without perforation or abscess without bleeding: Secondary | ICD-10-CM | POA: Diagnosis not present

## 2023-08-12 DIAGNOSIS — Z87891 Personal history of nicotine dependence: Secondary | ICD-10-CM | POA: Diagnosis not present

## 2023-08-12 DIAGNOSIS — Z888 Allergy status to other drugs, medicaments and biological substances status: Secondary | ICD-10-CM | POA: Diagnosis not present

## 2023-08-12 DIAGNOSIS — Z833 Family history of diabetes mellitus: Secondary | ICD-10-CM | POA: Diagnosis not present

## 2023-08-12 DIAGNOSIS — I4891 Unspecified atrial fibrillation: Secondary | ICD-10-CM | POA: Diagnosis present

## 2023-08-12 DIAGNOSIS — K625 Hemorrhage of anus and rectum: Principal | ICD-10-CM | POA: Diagnosis present

## 2023-08-12 DIAGNOSIS — Z8261 Family history of arthritis: Secondary | ICD-10-CM | POA: Diagnosis not present

## 2023-08-12 DIAGNOSIS — K760 Fatty (change of) liver, not elsewhere classified: Secondary | ICD-10-CM | POA: Diagnosis not present

## 2023-08-12 DIAGNOSIS — F32A Depression, unspecified: Secondary | ICD-10-CM | POA: Diagnosis present

## 2023-08-12 DIAGNOSIS — E785 Hyperlipidemia, unspecified: Secondary | ICD-10-CM | POA: Diagnosis not present

## 2023-08-12 DIAGNOSIS — Z79899 Other long term (current) drug therapy: Secondary | ICD-10-CM

## 2023-08-12 DIAGNOSIS — K429 Umbilical hernia without obstruction or gangrene: Secondary | ICD-10-CM | POA: Diagnosis not present

## 2023-08-12 DIAGNOSIS — Z96652 Presence of left artificial knee joint: Secondary | ICD-10-CM | POA: Diagnosis not present

## 2023-08-12 DIAGNOSIS — Z807 Family history of other malignant neoplasms of lymphoid, hematopoietic and related tissues: Secondary | ICD-10-CM

## 2023-08-12 DIAGNOSIS — Z9049 Acquired absence of other specified parts of digestive tract: Secondary | ICD-10-CM

## 2023-08-12 DIAGNOSIS — E876 Hypokalemia: Secondary | ICD-10-CM | POA: Diagnosis not present

## 2023-08-12 DIAGNOSIS — K922 Gastrointestinal hemorrhage, unspecified: Secondary | ICD-10-CM | POA: Diagnosis not present

## 2023-08-12 DIAGNOSIS — Z818 Family history of other mental and behavioral disorders: Secondary | ICD-10-CM

## 2023-08-12 LAB — TYPE AND SCREEN
ABO/RH(D): O POS
Antibody Screen: NEGATIVE

## 2023-08-12 LAB — CBC WITH DIFFERENTIAL/PLATELET
Abs Immature Granulocytes: 0.02 10*3/uL (ref 0.00–0.07)
Basophils Absolute: 0.1 10*3/uL (ref 0.0–0.1)
Basophils Relative: 1 %
Eosinophils Absolute: 0.3 10*3/uL (ref 0.0–0.5)
Eosinophils Relative: 5 %
HCT: 38.9 % (ref 36.0–46.0)
Hemoglobin: 12.6 g/dL (ref 12.0–15.0)
Immature Granulocytes: 0 %
Lymphocytes Relative: 28 %
Lymphs Abs: 1.5 10*3/uL (ref 0.7–4.0)
MCH: 28.7 pg (ref 26.0–34.0)
MCHC: 32.4 g/dL (ref 30.0–36.0)
MCV: 88.6 fL (ref 80.0–100.0)
Monocytes Absolute: 0.3 10*3/uL (ref 0.1–1.0)
Monocytes Relative: 6 %
Neutro Abs: 3.3 10*3/uL (ref 1.7–7.7)
Neutrophils Relative %: 60 %
Platelets: 142 10*3/uL — ABNORMAL LOW (ref 150–400)
RBC: 4.39 MIL/uL (ref 3.87–5.11)
RDW: 13.4 % (ref 11.5–15.5)
WBC: 5.4 10*3/uL (ref 4.0–10.5)
nRBC: 0 % (ref 0.0–0.2)

## 2023-08-12 LAB — PROTIME-INR
INR: 1.2 (ref 0.8–1.2)
Prothrombin Time: 15.1 s (ref 11.4–15.2)

## 2023-08-12 LAB — COMPREHENSIVE METABOLIC PANEL
ALT: 20 U/L (ref 0–44)
AST: 27 U/L (ref 15–41)
Albumin: 3.4 g/dL — ABNORMAL LOW (ref 3.5–5.0)
Alkaline Phosphatase: 64 U/L (ref 38–126)
Anion gap: 12 (ref 5–15)
BUN: 11 mg/dL (ref 8–23)
CO2: 31 mmol/L (ref 22–32)
Calcium: 8.3 mg/dL — ABNORMAL LOW (ref 8.9–10.3)
Chloride: 98 mmol/L (ref 98–111)
Creatinine, Ser: 0.93 mg/dL (ref 0.44–1.00)
GFR, Estimated: >60 mL/min (ref >60)
Glucose, Bld: 107 mg/dL — ABNORMAL HIGH (ref 70–99)
Potassium: 3 mmol/L — ABNORMAL LOW (ref 3.5–5.1)
Sodium: 141 mmol/L (ref 135–145)
Total Bilirubin: 0.8 mg/dL (ref <1.2)
Total Protein: 5.9 g/dL — ABNORMAL LOW (ref 6.5–8.1)

## 2023-08-12 LAB — URINALYSIS, ROUTINE W REFLEX MICROSCOPIC
Bacteria, UA: NONE SEEN
Bilirubin Urine: NEGATIVE
Glucose, UA: NEGATIVE mg/dL
Ketones, ur: NEGATIVE mg/dL
Leukocytes,Ua: NEGATIVE
Nitrite: NEGATIVE
Protein, ur: NEGATIVE mg/dL
Specific Gravity, Urine: 1.019 (ref 1.005–1.030)
pH: 7 (ref 5.0–8.0)

## 2023-08-12 LAB — LIPASE, BLOOD: Lipase: 24 U/L (ref 11–51)

## 2023-08-12 MED ORDER — IRBESARTAN 300 MG PO TABS
300.0000 mg | ORAL_TABLET | Freq: Every day | ORAL | Status: DC
  Administered 2023-08-13 – 2023-08-17 (×5): 300 mg via ORAL
  Filled 2023-08-12 (×5): qty 1

## 2023-08-12 MED ORDER — ADULT MULTIVITAMIN W/MINERALS CH: 1.0000 | ORAL_TABLET | Freq: Every day | ORAL | Status: DC

## 2023-08-12 MED ORDER — METOPROLOL TARTRATE 50 MG PO TABS
50.0000 mg | ORAL_TABLET | Freq: Two times a day (BID) | ORAL | Status: DC
  Administered 2023-08-12 – 2023-08-17 (×9): 50 mg via ORAL
  Filled 2023-08-12 (×10): qty 1

## 2023-08-12 MED ORDER — DULOXETINE HCL 60 MG PO CPEP
60.0000 mg | ORAL_CAPSULE | Freq: Every day | ORAL | Status: DC
  Administered 2023-08-13 – 2023-08-17 (×5): 60 mg via ORAL
  Filled 2023-08-12 (×5): qty 1

## 2023-08-12 MED ORDER — VILAZODONE HCL 20 MG PO TABS
10.0000 mg | ORAL_TABLET | Freq: Every day | ORAL | Status: DC
  Administered 2023-08-13 – 2023-08-17 (×5): 10 mg via ORAL
  Filled 2023-08-12 (×5): qty 0.5

## 2023-08-12 MED ORDER — PANTOPRAZOLE SODIUM 40 MG PO TBEC
40.0000 mg | DELAYED_RELEASE_TABLET | Freq: Every day | ORAL | Status: DC
Start: 2023-08-13 — End: 2023-08-17
  Administered 2023-08-13 – 2023-08-17 (×5): 40 mg via ORAL
  Filled 2023-08-12 (×5): qty 1

## 2023-08-12 MED ORDER — ATORVASTATIN CALCIUM 10 MG PO TABS
20.0000 mg | ORAL_TABLET | Freq: Every day | ORAL | Status: DC
  Administered 2023-08-12 – 2023-08-16 (×5): 20 mg via ORAL
  Filled 2023-08-12 (×5): qty 2

## 2023-08-12 MED ORDER — ALPRAZOLAM 0.5 MG PO TABS
0.5000 mg | ORAL_TABLET | Freq: Every day | ORAL | Status: DC | PRN
  Administered 2023-08-12 – 2023-08-13 (×2): 0.5 mg via ORAL
  Filled 2023-08-12 (×3): qty 1

## 2023-08-12 MED ORDER — ONDANSETRON HCL 4 MG PO TABS
4.0000 mg | ORAL_TABLET | Freq: Four times a day (QID) | ORAL | Status: DC | PRN
  Administered 2023-08-16: 4 mg via ORAL
  Filled 2023-08-12: qty 1

## 2023-08-12 MED ORDER — DICYCLOMINE HCL 20 MG PO TABS: 20.0000 mg | ORAL_TABLET | Freq: Two times a day (BID) | ORAL | Status: DC | PRN

## 2023-08-12 MED ORDER — ACETAMINOPHEN 650 MG RE SUPP: 650.0000 mg | Freq: Four times a day (QID) | RECTAL | Status: DC | PRN

## 2023-08-12 MED ORDER — VILAZODONE HCL 10 MG PO TABS: 10.0000 mg | ORAL_TABLET | Freq: Every day | ORAL | Status: DC

## 2023-08-12 MED ORDER — POTASSIUM CHLORIDE CRYS ER 20 MEQ PO TBCR
40.0000 meq | EXTENDED_RELEASE_TABLET | Freq: Once | ORAL | Status: AC
  Administered 2023-08-12: 40 meq via ORAL
  Filled 2023-08-12: qty 2

## 2023-08-12 MED ORDER — ONDANSETRON HCL 4 MG/2ML IJ SOLN
4.0000 mg | Freq: Four times a day (QID) | INTRAMUSCULAR | Status: DC | PRN
Start: 2023-08-12 — End: 2023-08-17

## 2023-08-12 MED ORDER — PANTOPRAZOLE SODIUM 40 MG IV SOLR
40.0000 mg | Freq: Once | INTRAVENOUS | Status: AC
  Administered 2023-08-12: 40 mg via INTRAVENOUS
  Filled 2023-08-12: qty 10

## 2023-08-12 MED ORDER — ACETAMINOPHEN 325 MG PO TABS
650.0000 mg | ORAL_TABLET | Freq: Four times a day (QID) | ORAL | Status: DC | PRN
  Administered 2023-08-15 – 2023-08-16 (×5): 650 mg via ORAL
  Filled 2023-08-12 (×5): qty 2

## 2023-08-12 MED ORDER — IOHEXOL 350 MG/ML SOLN
100.0000 mL | Freq: Once | INTRAVENOUS | Status: AC | PRN
  Administered 2023-08-12: 100 mL via INTRAVENOUS

## 2023-08-12 MED ORDER — DULOXETINE HCL 30 MG PO CPEP: 120.0000 mg | ORAL_CAPSULE | Freq: Two times a day (BID) | ORAL | Status: DC

## 2023-08-12 NOTE — Assessment & Plan Note (Signed)
Cont statin

## 2023-08-12 NOTE — ED Notes (Signed)
PA to bedside to complete POC occult, this RN to witness., PA unable to collect stool to send.

## 2023-08-12 NOTE — ED Notes (Addendum)
Pt ambulatory to RR w minimal assistance from staff

## 2023-08-12 NOTE — Assessment & Plan Note (Signed)
Cont BB Hold eliquis in setting of GIB.

## 2023-08-12 NOTE — ED Notes (Signed)
Admitting at bedside 

## 2023-08-12 NOTE — H&P (Addendum)
History and Physical    Patient: Kristen Wilson WUX:[id number] DOB: 1951/05/31 DOA: 08/12/2023 DOS: the patient was seen and examined on 08/12/2023 PCP: Gweneth Dimitri, MD  Patient coming from: Home  Chief Complaint:  Chief Complaint  Patient presents with   Rectal Bleeding   HPI: Kristen Wilson is a 72 y.o. female with medical history significant of HTN, HLD, a.fib on eliquis.  Pt in to ED with c/o BRBPR.  H/o Colonoscopy in 2021 some small polyps and colonic diverticulosis.  Small amount of BRB spotting over past 6 weeks, she had attributed to hemorrhoids though was different in that she felt fine and had no pain with this.  Today had BRBPR prompting her to go in to ED.  In ED: Had small BM with no blood.  GI consulted  Hospitalist asked to admit for obs.    Review of Systems: As mentioned in the history of present illness. All other systems reviewed and are negative. Past Medical History:  Diagnosis Date   Allergy    Anginal pain (HCC)    hx- non recent - r/t anxiety/panic attack   Anxiety    Arthritis    "Whole body"   Bronchitis    uses inhaler prn   Cough    smoker s cough   Depression    Diverticulitis    GERD (gastroesophageal reflux disease)    Hiatal hernia    Hyperlipidemia    Hypertension    IBS (irritable bowel syndrome)    Osteoporosis    Paroxysmal A-fib (HCC)    Pneumonia    hx   Primary localized osteoarthritis of left knee    Shortness of breath    occ   Smoker    SVD (spontaneous vaginal delivery)    x 2   Upper respiratory tract infection 06/14/2015   Starting IV Rocephin today also starting nebulizers for wheezing   Past Surgical History:  Procedure Laterality Date   CHOLECYSTECTOMY     COLONOSCOPY N/A 03/20/2013   Procedure: COLONOSCOPY;  Surgeon: Graylin Shiver, MD;  Location: [facility];  Service: Endoscopy;  Laterality: N/A;   CYST EXCISION PERINEAL  03/20/2018   Procedure: INCISION AND DRAINAGE OF PERINEAL CYST;   Surgeon: Olivia Mackie, MD;  Location: [facility];  Service: Gynecology;;   DILATION AND CURETTAGE OF UTERUS     KNEE ARTHROSCOPY     left   LEFT HEART CATH AND CORONARY ANGIOGRAPHY N/A 02/14/2022   Procedure: LEFT HEART CATH AND CORONARY ANGIOGRAPHY;  Surgeon: Yates Decamp, MD;  [facility];  Service: Cardiovascular;  Laterality: N/A;   TONSILLECTOMY     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   Procedure: LEFT TOTAL KNEE ARTHROPLASTY;  Surgeon: Salvatore Marvel, MD;  Location: [facility];  Service: Orthopedics;  Laterality: Left;   TUBAL LIGATION     UPPER GI ENDOSCOPY     reflux, hiatal hernia   VULVECTOMY N/A 03/20/2018   Procedure: WIDE EXCISION VULVECTOMY;  Surgeon: Olivia Mackie, MD;  Location: [facility];  Service: Gynecology;  Laterality: N/A;   WISDOM TOOTH EXTRACTION     Social History:  reports that she quit smoking about 18 months ago. Her smoking use included cigarettes. She started smoking about 41 years ago. She has a 60 pack-year smoking history. She has never used smokeless tobacco. She reports that she does not drink alcohol and does not use drugs.  Allergies  Allergen Reactions   Latex Hives   Nsaids  Aleve / Ibuprofen  - causes Blood Pressure to rise   Percocet [Oxycodone-Acetaminophen] Nausea And Vomiting    Family History  Problem Relation Age of Onset   Cancer Mother        breast   Arthritis Mother    Diabetes Mother    Cancer Father        bladder,multiple myeloma   Anxiety disorder Sister    Other Sister    Other Sister    Anxiety disorder Sister     Prior to Admission medications   Medication Sig Start Date End Date Taking? Authorizing Provider  ALPRAZolam Prudy Feeler) 0.5 MG tablet Take 0.5 mg by mouth daily as needed for anxiety. 12/12/21  Yes [provider]  apixaban (ELIQUIS) 5 MG TABS tablet Take 1 tablet (5 mg total) by mouth 2 (two) times daily. 02/14/22  Yes Yates Decamp, MD  atorvastatin (LIPITOR) 20 MG tablet Take 1 tablet (20 mg  total) by mouth daily. 06/03/23 09/01/23 Yes Tolia, Sunit, DO  Cholecalciferol (VITAMIN D3) 2000 units TABS Take 2,000 Units by mouth daily.   Yes [provider]  Cyanocobalamin (VITAMIN B-12 PO) Take 1 tablet by mouth daily.   Yes [provider]  dicyclomine (BENTYL) 20 MG tablet Take 20 mg by mouth 2 (two) times daily as needed. 07/09/23  Yes [provider]  DULoxetine (CYMBALTA) 60 MG capsule Take 120 mg by mouth 2 (two) times daily. 01/08/22  Yes [provider]  hydrochlorothiazide (HYDRODIURIL) 25 MG tablet Take 25 mg by mouth daily.   Yes [provider]  irbesartan (AVAPRO) 300 MG tablet Take 300 mg by mouth daily. 11/18/21  Yes [provider]  metoprolol tartrate (LOPRESSOR) 50 MG tablet Take 1 tablet (50 mg total) by mouth 2 (two) times daily. 02/14/22  Yes Yates Decamp, MD  Multiple Vitamin (MULTIVITAMIN) tablet Take 1 tablet by mouth daily.   Yes [provider]  pantoprazole (PROTONIX) 40 MG tablet Take 40 mg by mouth daily.    Yes [provider]  Vilazodone HCl (VIIBRYD) 10 MG TABS Take 10 mg by mouth daily. 07/18/23  Yes [provider]    Physical Exam: Vitals:   08/12/23 1439 08/12/23 1513 08/12/23 1855 08/12/23 1930  BP: (!) 166/97  (!) 163/83 (!) 171/83  Pulse: (!) 58  69 71  Resp: 12  19 14   Temp:  98.2 F (36.8 C) 98.2 F (36.8 C)   TempSrc:  Oral Oral   SpO2: 100%  100% 97%  Weight:      Height:       Constitutional: NAD, calm, comfortable Respiratory: clear to auscultation bilaterally, no wheezing, no crackles. Normal respiratory effort. No accessory muscle use.  Cardiovascular: Regular rate and rhythm, no murmurs / rubs / gallops. No extremity edema. 2+ pedal pulses. No carotid bruits.  Abdomen: no tenderness, no masses palpated. No hepatosplenomegaly. Bowel sounds positive.  Neurologic: CN 2-12 grossly intact. Sensation intact, DTR normal. Strength 5/5 in all 4.  Psychiatric: Normal  judgment and insight. Alert and oriented x 3. Normal mood.   Data Reviewed:    Labs on Admission: I have personally reviewed following labs and imaging studies  CBC: Recent Labs  Lab 08/12/23 1207  WBC 5.4  NEUTROABS 3.3  HGB 12.6  HCT 38.9  MCV 88.6  PLT 142*   Basic Metabolic Panel: Recent Labs  Lab 08/12/23 1207  NA 141  K 3.0*  CL 98  CO2 31  GLUCOSE 107*  BUN 11  CREATININE 0.93  CALCIUM 8.3*   GFR: Estimated Creatinine Clearance: 47.2 mL/min (by C-G formula based on SCr of 0.93 mg/dL). Liver Function Tests: Recent Labs  Lab 08/12/23 1207  AST 27  ALT 20  ALKPHOS 64  BILITOT 0.8  PROT 5.9*  ALBUMIN 3.4*   Recent Labs  Lab 08/12/23 1207  LIPASE 24   No results for input(s): "AMMONIA" in the last 168 hours. Coagulation Profile: Recent Labs  Lab 08/12/23 1207  INR 1.2   Cardiac Enzymes: No results for input(s): "CKTOTAL", "CKMB", "CKMBINDEX", "TROPONINI" in the last 168 hours. BNP (last 3 results) No results for input(s): "PROBNP" in the last 8760 hours. HbA1C: No results for input(s): "HGBA1C" in the last 72 hours. CBG: No results for input(s): "GLUCAP" in the last 168 hours. Lipid Profile: No results for input(s): "CHOL", "HDL", "LDLCALC", "TRIG", "CHOLHDL", "LDLDIRECT" in the last 72 hours. Thyroid Function Tests: No results for input(s): "TSH", "T4TOTAL", "FREET4", "T3FREE", "THYROIDAB" in the last 72 hours. Anemia Panel: No results for input(s): "VITAMINB12", "FOLATE", "FERRITIN", "TIBC", "IRON", "RETICCTPCT" in the last 72 hours. Urine analysis:    Component Value Date/Time   COLORURINE AMBER (A) 08/12/2023 1301   APPEARANCEUR CLEAR 08/12/2023 1301   LABSPEC 1.019 08/12/2023 1301   PHURINE 7.0 08/12/2023 1301   GLUCOSEU NEGATIVE 08/12/2023 1301   HGBUR MODERATE (A) 08/12/2023 1301   BILIRUBINUR NEGATIVE 08/12/2023 1301   KETONESUR NEGATIVE 08/12/2023 1301   PROTEINUR NEGATIVE 08/12/2023 1301   NITRITE NEGATIVE 08/12/2023 1301    LEUKOCYTESUR NEGATIVE 08/12/2023 1301    Radiological Exams on Admission: CT ANGIO GI BLEED Result Date: 08/12/2023 CLINICAL DATA:  Lower gastrointestinal bleeding since this morning with blood in the stool. EXAM: CTA ABDOMEN AND PELVIS WITHOUT AND WITH CONTRAST TECHNIQUE: Multidetector CT imaging of the abdomen and pelvis was performed using the standard protocol during bolus administration of intravenous contrast. Multiplanar reconstructed images and MIPs were obtained and reviewed to evaluate the vascular anatomy. RADIATION DOSE REDUCTION: This exam was performed according to the departmental dose-optimization program which includes automated exposure control, adjustment of the mA and/or kV according to patient size and/or use of iterative reconstruction technique. CONTRAST:  OMNIPAQUE IOHEXOL 350 MG/ML SOLN COMPARISON:  Abdomen and pelvis CT dated 10/31/2020 FINDINGS: VASCULAR Aorta: Atheromatous calcifications without aneurysm or significant stenosis. No dissection. Celiac: Patent without evidence of aneurysm, dissection, vasculitis or significant stenosis. SMA: Patent without evidence of aneurysm, dissection, vasculitis or significant stenosis. Renals: Both renal arteries are patent without evidence of aneurysm, dissection, vasculitis, fibromuscular dysplasia or significant stenosis. IMA: Patent without evidence of aneurysm, dissection, vasculitis or significant stenosis. Inflow: Atheromatous calcifications without aneurysm or significant stenosis. No dissection. Proximal Outflow: Bilateral common femoral and visualized portions of the superficial and profunda femoral arteries are patent without evidence of aneurysm, dissection, vasculitis or significant stenosis. Veins: No obvious venous abnormality within the limitations of this arterial phase study. Review of the MIP images confirms the above findings. NON-VASCULAR Lower chest: Mildly enlarged heart without significant change. Coronary artery  calcifications. Clear lung bases. Hepatobiliary: Mild diffuse low-density of the liver without significant change. Cholecystectomy clips. Pancreas: Unremarkable. No pancreatic ductal dilatation or surrounding inflammatory changes. Spleen: Normal in size without focal abnormality. Adrenals/Urinary Tract: Normal-appearing right adrenal gland. Stable mild left adrenal hyperplasia. Unremarkable kidneys, ureters and urinary bladder. Stomach/Bowel: Scattered ingested calcific density in the small bowel. No extravasated contrast in the bowel. Multiple sigmoid colon diverticula without evidence of diverticulitis. Unremarkable stomach and appendix. Lymphatic: No enlarged lymph nodes.  Reproductive: Uterus and bilateral adnexa are unremarkable. Other: Tiny umbilical hernia containing fat. Musculoskeletal: Lumbar and lower thoracic spine degenerative changes. Mild right and mild-to-moderate left hip degenerative changes. IMPRESSION: 1. No evidence of active gastrointestinal bleeding. 2. Sigmoid colon diverticulosis. 3. Stable mild hepatic steatosis. 4. Stable mild cardiomegaly. 5. Atheromatous coronary artery calcifications. Electronically Signed   By: Beckie Salts M.D.   On: 08/12/2023 17:20    EKG: Independently reviewed.   Assessment and Plan: * BRBPR (bright red blood per rectum) See Dr. Ewing Schlein note Obs overnight Repeat CBC tomorrow AM Clear liquid diet No GIB seen on CTA If continues to not bleed overnight, probably okay to go home tomorrow, GI to determine when its okay to resume eliquis.  A-fib (HCC) Cont BB Hold eliquis in setting of GIB.  Hypertension Despite GIB, BP running on high side in ED (160s-170s SBP). Cont BB Cont ARB Hold hydrochlorothiazide for the moment till we see how she does overnight  Hyperlipidemia Cont statin      Advance Care Planning:   Code Status: Full Code  Consults: See Dr. Ewing Schlein consult note  Family Communication: No family in room  Severity of Illness: The  appropriate patient status for this patient is OBSERVATION. Observation status is judged to be reasonable and necessary in order to provide the required intensity of service to ensure the patient's safety. The patient's presenting symptoms, physical exam findings, and initial radiographic and laboratory data in the context of their medical condition is felt to place them at decreased risk for further clinical deterioration. Furthermore, it is anticipated that the patient will be medically stable for discharge from the hospital within 2 midnights of admission.   Author: Hillary Bow., DO 08/12/2023 8:14 PM  For on call review [email or web address].

## 2023-08-12 NOTE — Consult Note (Signed)
Reason for Consult: Bright red blood per rectum Referring Physician: Hospital team  Kristen Wilson is an 72 y.o. female.  HPI: Patient seen and examined in her office computer chart and her hospital computer chart was reviewed and she did have a colonoscopy in 2021 which showed some small polyps and some left-sided diverticuli and she had had a little spotting of bright red blood per rectum periodically over the last 6 weeks and she attributed that to hemorrhoids but this was much different but she feels fine and has no pain and she just had a bowel movement here in the hospital without any blood and her case was discussed with the family member as well and all their questions were answered and she is not on any aspirin or nonsteroidals and her family history is negative from a GI standpoint and she has not had bleeding like this before and we discussed diverticuli as the most likely etiology  Past Medical History:  Diagnosis Date   Allergy    Anginal pain (HCC)    hx- non recent - r/t anxiety/panic attack   Anxiety    Arthritis    "Whole body"   Bronchitis    uses inhaler prn   Cough    smoker s cough   Depression    Diverticulitis    GERD (gastroesophageal reflux disease)    Hiatal hernia    Hyperlipidemia    Hypertension    IBS (irritable bowel syndrome)    Osteoporosis    Paroxysmal A-fib (HCC)    Pneumonia    hx   Primary localized osteoarthritis of left knee    Shortness of breath    occ   Smoker    SVD (spontaneous vaginal delivery)    x 2   Upper respiratory tract infection 06/14/2015   Starting IV Rocephin today also starting nebulizers for wheezing    Past Surgical History:  Procedure Laterality Date   CHOLECYSTECTOMY     COLONOSCOPY N/A 03/20/2013   Procedure: COLONOSCOPY;  Surgeon: Graylin Shiver, MD;  Location: [facility];  Service: Endoscopy;  Laterality: N/A;   CYST EXCISION PERINEAL  03/20/2018   Procedure: INCISION AND DRAINAGE OF PERINEAL CYST;  Surgeon:  Olivia Mackie, MD;  Location: [facility];  Service: Gynecology;;   DILATION AND CURETTAGE OF UTERUS     KNEE ARTHROSCOPY     left   LEFT HEART CATH AND CORONARY ANGIOGRAPHY N/A 02/14/2022   Procedure: LEFT HEART CATH AND CORONARY ANGIOGRAPHY;  Surgeon: Yates Decamp, MD;  [facility];  Service: Cardiovascular;  Laterality: N/A;   TONSILLECTOMY     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   Procedure: LEFT TOTAL KNEE ARTHROPLASTY;  Surgeon: Salvatore Marvel, MD;  Location: [facility];  Service: Orthopedics;  Laterality: Left;   TUBAL LIGATION     UPPER GI ENDOSCOPY     reflux, hiatal hernia   VULVECTOMY N/A 03/20/2018   Procedure: WIDE EXCISION VULVECTOMY;  Surgeon: Olivia Mackie, MD;  Location: [facility];  Service: Gynecology;  Laterality: N/A;   WISDOM TOOTH EXTRACTION      Family History  Problem Relation Age of Onset   Cancer Mother        breast   Arthritis Mother    Diabetes Mother    Cancer Father        bladder,multiple myeloma   Anxiety disorder Sister    Other Sister    Other Sister    Anxiety disorder Sister  Social History:  reports that she quit smoking about 18 months ago. Her smoking use included cigarettes. She started smoking about 41 years ago. She has a 60 pack-year smoking history. She has never used smokeless tobacco. She reports that she does not drink alcohol and does not use drugs.  Allergies:  Allergies  Allergen Reactions   Latex Hives   Nsaids     Aleve / Ibuprofen  - causes Blood Pressure to rise   Percocet [Oxycodone-Acetaminophen] Nausea And Vomiting    Medications: I have reviewed the patient's current medications.  Results for orders placed or performed during the hospital encounter of 08/12/23 (from the past 48 hours)  Type and screen [facility]     Status: None   Collection Time: 08/12/23 12:04 PM  Result Value Ref Range   ABO/RH(D) O POS    Antibody Screen NEG    Sample Expiration      08/15/2023,2359 Performed  at [facility], [address], [place], [place] [address]   CBC with Differential     Status: Abnormal   Collection Time: 08/12/23 12:07 PM  Result Value Ref Range   WBC 5.4 4.0 - 10.5 K/uL   RBC 4.39 3.87 - 5.11 MIL/uL   Hemoglobin 12.6 12.0 - 15.0 g/dL   HCT 09.8 11.9 - 14.7 %   MCV 88.6 80.0 - 100.0 fL   MCH 28.7 26.0 - 34.0 pg   MCHC 32.4 30.0 - 36.0 g/dL   RDW 82.9 56.2 - 13.0 %   Platelets 142 (L) 150 - 400 K/uL   nRBC 0.0 0.0 - 0.2 %   Neutrophils Relative % 60 %   Neutro Abs 3.3 1.7 - 7.7 K/uL   Lymphocytes Relative 28 %   Lymphs Abs 1.5 0.7 - 4.0 K/uL   Monocytes Relative 6 %   Monocytes Absolute 0.3 0.1 - 1.0 K/uL   Eosinophils Relative 5 %   Eosinophils Absolute 0.3 0.0 - 0.5 K/uL   Basophils Relative 1 %   Basophils Absolute 0.1 0.0 - 0.1 K/uL   Immature Granulocytes 0 %   Abs Immature Granulocytes 0.02 0.00 - 0.07 K/uL    Comment: Performed at [facility], [address], [place], [place] [address]  Comprehensive metabolic panel     Status: Abnormal   Collection Time: 08/12/23 12:07 PM  Result Value Ref Range   Sodium 141 135 - 145 mmol/L   Potassium 3.0 (L) 3.5 - 5.1 mmol/L   Chloride 98 98 - 111 mmol/L   CO2 31 22 - 32 mmol/L   Glucose, Bld 107 (H) 70 - 99 mg/dL    Comment: Glucose reference range applies only to samples taken after fasting for at least 8 hours.   BUN 11 8 - 23 mg/dL   Creatinine, Ser 4.69 0.44 - 1.00 mg/dL   Calcium 8.3 (L) 8.9 - 10.3 mg/dL   Total Protein 5.9 (L) 6.5 - 8.1 g/dL   Albumin 3.4 (L) 3.5 - 5.0 g/dL   AST 27 15 - 41 U/L   ALT 20 0 - 44 U/L   Alkaline Phosphatase 64 38 - 126 U/L   Total Bilirubin 0.8 <1.2 mg/dL   GFR, Estimated >62 >95 mL/min    Comment: (NOTE) Calculated using the CKD-EPI Creatinine Equation (2021)    Anion gap 12 5 - 15    Comment: Performed at [facility], [address], [place], [place] [address]  Lipase, blood  Status: None   Collection Time: 08/12/23 12:07 PM  Result  Value Ref Range   Lipase 24 11 - 51 U/L    Comment: Performed at [facility], [address] [place], [place] [address]  Protime-INR     Status: None   Collection Time: 08/12/23 12:07 PM  Result Value Ref Range   Prothrombin Time 15.1 11.4 - 15.2 seconds   INR 1.2 0.8 - 1.2    Comment: (NOTE) INR goal varies based on device and disease states. Performed at [facility], [address], [place], [place] [address]   Urinalysis, Routine w reflex microscopic -Urine, Clean Catch     Status: Abnormal   Collection Time: 08/12/23  1:01 PM  Result Value Ref Range   Color, Urine AMBER (A) YELLOW    Comment: BIOCHEMICALS MAY BE AFFECTED BY COLOR   APPearance CLEAR CLEAR   Specific Gravity, Urine 1.019 1.005 - 1.030   pH 7.0 5.0 - 8.0   Glucose, UA NEGATIVE NEGATIVE mg/dL   Hgb urine dipstick MODERATE (A) NEGATIVE   Bilirubin Urine NEGATIVE NEGATIVE   Ketones, ur NEGATIVE NEGATIVE mg/dL   Protein, ur NEGATIVE NEGATIVE mg/dL   Nitrite NEGATIVE NEGATIVE   Leukocytes,Ua NEGATIVE NEGATIVE   RBC / HPF 0-5 0 - 5 RBC/hpf   WBC, UA 0-5 0 - 5 WBC/hpf   Bacteria, UA NONE SEEN NONE SEEN   Squamous Epithelial / HPF 0-5 0 - 5 /HPF   Mucus PRESENT     Comment: Performed at [facility], [address], [place], [place] [address]    No results found.  Review of Systems negative except above Blood pressure (!) 166/97, pulse (!) 58, temperature 98.2 F (36.8 C), temperature source Oral, resp. rate 12, height 5\' 2"  (1.575 m), weight 63.5 kg, SpO2 100%. Physical Exam vital signs stable afebrile no acute distress abdomen is soft nontender labs reviewed CT pending BUN and creatinine hemoglobin normal at 12.6 may be a slight drop from her baseline   Assessment/Plan: Probably diverticular bleeding in a patient on Eliquis Plan: Await CTA findings but in the meantime we will allow clear liquids and do not expect the CTA to show active bleeding and if doing well tomorrow without bleeding  can advance diet and I will check on CT report and her a.m. CBC and please call me tomorrow if any specific GI question or problem otherwise hopefully if no more bleeding can advance diet and go home soon  [facility] E 08/12/2023, 4:48 PM

## 2023-08-12 NOTE — ED Notes (Signed)
ED TO INPATIENT HANDOFF REPORT  ED Nurse Name and Phone #: Lyndee Leo  S Name/Age/Gender Kristen Wilson 72 y.o. female Room/Bed: 029C/029C  Code Status   Code Status: Full Code  Home/SNF/Other Home Patient oriented to: self, place, time, and situation Is this baseline? Yes   Triage Complete: Triage complete  Chief Complaint BRBPR (bright red blood per rectum) [K62.5]  Triage Note Patient c/o rectal bleeding that started spontaneously without BM. Hx of hemorrhoids, bleeding was controlled prior to EMS arrival, remains controlled at this time. Patient is on eliquis. BP 130/70, 98% RA, HR 70, RR 14. A&Ox4, ambulatory with EMS.   Allergies Allergies  Allergen Reactions   Latex Hives   Nsaids     Aleve / Ibuprofen  - causes Blood Pressure to rise   Percocet [Oxycodone-Acetaminophen] Nausea And Vomiting    Level of Care/Admitting Diagnosis ED Disposition     ED Disposition  Admit   Condition  --   Comment  Hospital Area: [facility] [[id number]]  Level of Care: Telemetry Medical [104]  May place patient in observation at [facility] or [facility] if equivalent level of care is available:: No  Covid Evaluation: Asymptomatic - no recent exposure (last 10 days) testing not required  Diagnosis: BRBPR (bright red blood per rectum) [295621]  Admitting Physician: Hillary Bow [phone]  Attending Physician: Hillary Bow [[id number]]          B Medical/Surgery History Past Medical History:  Diagnosis Date   Allergy    Anginal pain (HCC)    hx- non recent - r/t anxiety/panic attack   Anxiety    Arthritis    "Whole body"   Bronchitis    uses inhaler prn   Cough    smoker s cough   Depression    Diverticulitis    GERD (gastroesophageal reflux disease)    Hiatal hernia    Hyperlipidemia    Hypertension    IBS (irritable bowel syndrome)    Osteoporosis    Paroxysmal A-fib (HCC)    Pneumonia    hx   Primary localized osteoarthritis of left  knee    Shortness of breath    occ   Smoker    SVD (spontaneous vaginal delivery)    x 2   Upper respiratory tract infection 06/14/2015   Starting IV Rocephin today also starting nebulizers for wheezing   Past Surgical History:  Procedure Laterality Date   CHOLECYSTECTOMY     COLONOSCOPY N/A 03/20/2013   Procedure: COLONOSCOPY;  Surgeon: Graylin Shiver, MD;  Location: [facility];  Service: Endoscopy;  Laterality: N/A;   CYST EXCISION PERINEAL  03/20/2018   Procedure: INCISION AND DRAINAGE OF PERINEAL CYST;  Surgeon: Olivia Mackie, MD;  Location: [facility];  Service: Gynecology;;   DILATION AND CURETTAGE OF UTERUS     KNEE ARTHROSCOPY     left   LEFT HEART CATH AND CORONARY ANGIOGRAPHY N/A 02/14/2022   Procedure: LEFT HEART CATH AND CORONARY ANGIOGRAPHY;  Surgeon: Yates Decamp, MD;  Location: [facility];  Service: Cardiovascular;  Laterality: N/A;   TONSILLECTOMY     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   Procedure: LEFT TOTAL KNEE ARTHROPLASTY;  Surgeon: Salvatore Marvel, MD;  Location: [facility] OR;  Service: Orthopedics;  Laterality: Left;   TUBAL LIGATION     UPPER GI ENDOSCOPY     reflux, hiatal hernia   VULVECTOMY N/A 03/20/2018   Procedure: WIDE EXCISION VULVECTOMY;  Surgeon: Olivia Mackie, MD;  Location: [facility];  Service: Gynecology;  Laterality: N/A;   WISDOM TOOTH EXTRACTION       A IV Location/Drains/Wounds Patient Lines/Drains/Airways Status     Active Line/Drains/Airways     Name Placement date Placement time Site Days   Peripheral IV 08/12/23 20 G Left Antecubital 08/12/23  1254  Antecubital  less than 1   Incision (Closed) 06/13/15 Knee Left 06/13/15  0806  -- 2982   Incision (Closed) 03/20/18 Other (Comment) Other (Comment) 03/20/18  0955  -- 1971            Intake/Output Last 24 hours No intake or output data in the 24 hours ending 08/12/23 2047  Labs/Imaging Results for orders placed or performed during the hospital encounter of 08/12/23 (from the  past 48 hours)  Type and screen  [facility]     Status: None   Collection Time: 08/12/23 12:04 PM  Result Value Ref Range   ABO/RH(D) O POS    Antibody Screen NEG    Sample Expiration      08/15/2023,2359 Performed at [facility], [address], [place], [place] [address]   CBC with Differential     Status: Abnormal   Collection Time: 08/12/23 12:07 PM  Result Value Ref Range   WBC 5.4 4.0 - 10.5 K/uL   RBC 4.39 3.87 - 5.11 MIL/uL   Hemoglobin 12.6 12.0 - 15.0 g/dL   HCT 21.3 08.6 - 57.8 %   MCV 88.6 80.0 - 100.0 fL   MCH 28.7 26.0 - 34.0 pg   MCHC 32.4 30.0 - 36.0 g/dL   RDW 46.9 62.9 - 52.8 %   Platelets 142 (L) 150 - 400 K/uL   nRBC 0.0 0.0 - 0.2 %   Neutrophils Relative % 60 %   Neutro Abs 3.3 1.7 - 7.7 K/uL   Lymphocytes Relative 28 %   Lymphs Abs 1.5 0.7 - 4.0 K/uL   Monocytes Relative 6 %   Monocytes Absolute 0.3 0.1 - 1.0 K/uL   Eosinophils Relative 5 %   Eosinophils Absolute 0.3 0.0 - 0.5 K/uL   Basophils Relative 1 %   Basophils Absolute 0.1 0.0 - 0.1 K/uL   Immature Granulocytes 0 %   Abs Immature Granulocytes 0.02 0.00 - 0.07 K/uL    Comment: Performed at [facility], [address] [place], [place] [address]  Comprehensive metabolic panel     Status: Abnormal   Collection Time: 08/12/23 12:07 PM  Result Value Ref Range   Sodium 141 135 - 145 mmol/L   Potassium 3.0 (L) 3.5 - 5.1 mmol/L   Chloride 98 98 - 111 mmol/L   CO2 31 22 - 32 mmol/L   Glucose, Bld 107 (H) 70 - 99 mg/dL    Comment: Glucose reference range applies only to samples taken after fasting for at least 8 hours.   BUN 11 8 - 23 mg/dL   Creatinine, Ser 4.01 0.44 - 1.00 mg/dL   Calcium 8.3 (L) 8.9 - 10.3 mg/dL   Total Protein 5.9 (L) 6.5 - 8.1 g/dL   Albumin 3.4 (L) 3.5 - 5.0 g/dL   AST 27 15 - 41 U/L   ALT 20 0 - 44 U/L   Alkaline Phosphatase 64 38 - 126 U/L   Total Bilirubin 0.8 <1.2 mg/dL   GFR, Estimated >02 >72 mL/min    Comment: (NOTE) Calculated  using the CKD-EPI Creatinine Equation (2021)    Anion gap 12 5 - 15  Comment: Performed at [facility], [address], [place], [place] [address]  Lipase, blood     Status: None   Collection Time: 08/12/23 12:07 PM  Result Value Ref Range   Lipase 24 11 - 51 U/L    Comment: Performed at [facility], [address] [place], [place] [address]  Protime-INR     Status: None   Collection Time: 08/12/23 12:07 PM  Result Value Ref Range   Prothrombin Time 15.1 11.4 - 15.2 seconds   INR 1.2 0.8 - 1.2    Comment: (NOTE) INR goal varies based on device and disease states. Performed at [facility], [address], [place], [place] [address]   Urinalysis, Routine w reflex microscopic -Urine, Clean Catch     Status: Abnormal   Collection Time: 08/12/23  1:01 PM  Result Value Ref Range   Color, Urine AMBER (A) YELLOW    Comment: BIOCHEMICALS MAY BE AFFECTED BY COLOR   APPearance CLEAR CLEAR   Specific Gravity, Urine 1.019 1.005 - 1.030   pH 7.0 5.0 - 8.0   Glucose, UA NEGATIVE NEGATIVE mg/dL   Hgb urine dipstick MODERATE (A) NEGATIVE   Bilirubin Urine NEGATIVE NEGATIVE   Ketones, ur NEGATIVE NEGATIVE mg/dL   Protein, ur NEGATIVE NEGATIVE mg/dL   Nitrite NEGATIVE NEGATIVE   Leukocytes,Ua NEGATIVE NEGATIVE   RBC / HPF 0-5 0 - 5 RBC/hpf   WBC, UA 0-5 0 - 5 WBC/hpf   Bacteria, UA NONE SEEN NONE SEEN   Squamous Epithelial / HPF 0-5 0 - 5 /HPF   Mucus PRESENT     Comment: Performed at [facility], [address] [place], [place] [address]   CT ANGIO GI BLEED Result Date: 08/12/2023 CLINICAL DATA:  Lower gastrointestinal bleeding since this morning with blood in the stool. EXAM: CTA ABDOMEN AND PELVIS WITHOUT AND WITH CONTRAST TECHNIQUE: Multidetector CT imaging of the abdomen and pelvis was performed using the standard protocol during bolus administration of intravenous contrast. Multiplanar reconstructed images and MIPs were obtained and reviewed to evaluate  the vascular anatomy. RADIATION DOSE REDUCTION: This exam was performed according to the departmental dose-optimization program which includes automated exposure control, adjustment of the mA and/or kV according to patient size and/or use of iterative reconstruction technique. CONTRAST:  OMNIPAQUE IOHEXOL 350 MG/ML SOLN COMPARISON:  Abdomen and pelvis CT dated 10/31/2020 FINDINGS: VASCULAR Aorta: Atheromatous calcifications without aneurysm or significant stenosis. No dissection. Celiac: Patent without evidence of aneurysm, dissection, vasculitis or significant stenosis. SMA: Patent without evidence of aneurysm, dissection, vasculitis or significant stenosis. Renals: Both renal arteries are patent without evidence of aneurysm, dissection, vasculitis, fibromuscular dysplasia or significant stenosis. IMA: Patent without evidence of aneurysm, dissection, vasculitis or significant stenosis. Inflow: Atheromatous calcifications without aneurysm or significant stenosis. No dissection. Proximal Outflow: Bilateral common femoral and visualized portions of the superficial and profunda femoral arteries are patent without evidence of aneurysm, dissection, vasculitis or significant stenosis. Veins: No obvious venous abnormality within the limitations of this arterial phase study. Review of the MIP images confirms the above findings. NON-VASCULAR Lower chest: Mildly enlarged heart without significant change. Coronary artery calcifications. Clear lung bases. Hepatobiliary: Mild diffuse low-density of the liver without significant change. Cholecystectomy clips. Pancreas: Unremarkable. No pancreatic ductal dilatation or surrounding inflammatory changes. Spleen: Normal in size without focal abnormality. Adrenals/Urinary Tract: Normal-appearing right adrenal gland. Stable mild left adrenal hyperplasia. Unremarkable kidneys, ureters and urinary bladder. Stomach/Bowel: Scattered ingested calcific density in  the small bowel. No  extravasated contrast in the bowel. Multiple sigmoid colon diverticula without evidence of diverticulitis. Unremarkable stomach and appendix. Lymphatic: No enlarged lymph nodes. Reproductive: Uterus and bilateral adnexa are unremarkable. Other: Tiny umbilical hernia containing fat. Musculoskeletal: Lumbar and lower thoracic spine degenerative changes. Mild right and mild-to-moderate left hip degenerative changes. IMPRESSION: 1. No evidence of active gastrointestinal bleeding. 2. Sigmoid colon diverticulosis. 3. Stable mild hepatic steatosis. 4. Stable mild cardiomegaly. 5. Atheromatous coronary artery calcifications. Electronically Signed   By: Beckie Salts M.D.   On: 08/12/2023 17:20    Pending Labs Unresulted Labs (From admission, onward)     Start     Ordered   08/13/23 0500  CBC  Tomorrow morning,   R        08/12/23 2014   08/13/23 0500  Basic metabolic panel  Tomorrow morning,   R        08/12/23 2014            Vitals/Pain Today's Vitals   08/12/23 1439 08/12/23 1513 08/12/23 1855 08/12/23 1930  BP: (!) 166/97  (!) 163/83 (!) 171/83  Pulse: (!) 58  69 71  Resp: 12  19 14   Temp:  98.2 F (36.8 C) 98.2 F (36.8 C)   TempSrc:  Oral Oral   SpO2: 100%  100% 97%  Weight:      Height:      PainSc:        Isolation Precautions No active isolations  Medications Medications  ALPRAZolam (XANAX) tablet 0.5 mg (has no administration in time range)  atorvastatin (LIPITOR) tablet 20 mg (has no administration in time range)  dicyclomine (BENTYL) tablet 20 mg (has no administration in time range)  DULoxetine (CYMBALTA) DR capsule 120 mg (has no administration in time range)  metoprolol tartrate (LOPRESSOR) tablet 50 mg (has no administration in time range)  multivitamin with minerals tablet 1 tablet (has no administration in time range)  pantoprazole (PROTONIX) EC tablet 40 mg (has no administration in time range)  irbesartan (AVAPRO) tablet 300 mg (has no administration in time  range)  acetaminophen (TYLENOL) tablet 650 mg (has no administration in time range)    Or  acetaminophen (TYLENOL) suppository 650 mg (has no administration in time range)  ondansetron (ZOFRAN) tablet 4 mg (has no administration in time range)    Or  ondansetron (ZOFRAN) injection 4 mg (has no administration in time range)  Vilazodone HCl 20mg  tabs (has no administration in time range)  potassium chloride SA (KLOR-CON M) CR tablet 40 mEq (40 mEq Oral Given 08/12/23 1332)  iohexol (OMNIPAQUE) 350 MG/ML injection 100 mL (100 mLs Intravenous Contrast Given 08/12/23 1547)  pantoprazole (PROTONIX) injection 40 mg (40 mg Intravenous Given 08/12/23 1909)    Mobility walks     Focused Assessments GI   R Recommendations: See Admitting Provider Note  Report given to:   Additional Notes:

## 2023-08-12 NOTE — ED Provider Notes (Signed)
[facility] Provider Note   CSN: [id number] Arrival date & time: 08/12/23  1103     History  Chief Complaint  Patient presents with   Rectal Bleeding    Kristen Wilson is a 72 y.o. female with a past medical history significant for hyperlipidemia, hypertension, IBS, A-fib on Eliquis who presents to the ED due to rectal bleeding that started around 11 AM this morning.  Patient states she was standing in the bathroom and blood was running out of her rectum just while standing, not during a bowel movement. Does have a history of hemorrhoids.  Last bowel movement was yesterday or the day before which was normal without any bleeding. Described blood as bright red blood. Patient admits to dried blood in her underwear.  Denies abdominal pain.  Admits to some lightheadedness when going from sitting to standing.  No chest pain or shortness of breath.  Denies previous GI bleed.  History obtained from patient and past medical records. No interpreter used during encounter.       Home Medications Prior to Admission medications   Medication Sig Start Date End Date Taking? Authorizing Provider  ALPRAZolam Prudy Feeler) 0.5 MG tablet Take 0.5 mg by mouth daily as needed for anxiety. 12/12/21  Yes [provider]  apixaban (ELIQUIS) 5 MG TABS tablet Take 1 tablet (5 mg total) by mouth 2 (two) times daily. 02/14/22  Yes Yates Decamp, MD  atorvastatin (LIPITOR) 20 MG tablet Take 1 tablet (20 mg total) by mouth daily. 06/03/23 09/01/23 Yes Tolia, Sunit, DO  Cholecalciferol (VITAMIN D3) 2000 units TABS Take 2,000 Units by mouth daily.   Yes [provider]  Cyanocobalamin (VITAMIN B-12 PO) Take 1 tablet by mouth daily.   Yes [provider]  dicyclomine (BENTYL) 20 MG tablet Take 20 mg by mouth 2 (two) times daily as needed. 07/09/23  Yes [provider]  DULoxetine (CYMBALTA) 60 MG capsule Take 120 mg by mouth 2 (two) times daily.  01/08/22  Yes [provider]  hydrochlorothiazide (HYDRODIURIL) 25 MG tablet Take 25 mg by mouth daily.   Yes [provider]  irbesartan (AVAPRO) 300 MG tablet Take 300 mg by mouth daily. 11/18/21  Yes [provider]  metoprolol tartrate (LOPRESSOR) 50 MG tablet Take 1 tablet (50 mg total) by mouth 2 (two) times daily. 02/14/22  Yes Yates Decamp, MD  Multiple Vitamin (MULTIVITAMIN) tablet Take 1 tablet by mouth daily.   Yes [provider]  pantoprazole (PROTONIX) 40 MG tablet Take 40 mg by mouth daily.    Yes [provider]  Vilazodone HCl (VIIBRYD) 10 MG TABS Take 10 mg by mouth daily. 07/18/23  Yes [provider]      Allergies    Latex, Nsaids, and Percocet [oxycodone-acetaminophen]    Review of Systems   Review of Systems  Respiratory:  Negative for shortness of breath.   Cardiovascular:  Negative for chest pain.  Gastrointestinal:  Positive for anal bleeding.  Neurological:  Positive for light-headedness.    Physical Exam Updated Vital Signs BP (!) 166/97   Pulse (!) 58   Temp 98.2 F (36.8 C) (Oral)   Resp 12   Ht 5\' 2"  (1.575 m)   Wt 63.5 kg   SpO2 100%   BMI 25.61 kg/m  Physical Exam Vitals and nursing note reviewed.  Constitutional:      General: She is not in acute distress.    Appearance: She is not  ill-appearing.  HENT:     Head: Normocephalic.  Eyes:     Pupils: Pupils are equal, round, and reactive to light.  Cardiovascular:     Rate and Rhythm: Normal rate and regular rhythm.     Pulses: Normal pulses.     Heart sounds: Normal heart sounds. No murmur heard.    No friction rub. No gallop.  Pulmonary:     Effort: Pulmonary effort is normal.     Breath sounds: Normal breath sounds.  Abdominal:     General: Abdomen is flat. There is no distension.     Palpations: Abdomen is soft.     Tenderness: There is no abdominal tenderness. There is no guarding or rebound.  Genitourinary:    Comments: Rectal  exam performed with chaperone in room.  Unable to collect any stool however, dried blood around rectum and in underwear.  No dried blood around opening of vagina. Musculoskeletal:        General: Normal range of motion.     Cervical back: Neck supple.  Skin:    General: Skin is warm and dry.  Neurological:     General: No focal deficit present.     Mental Status: She is alert.  Psychiatric:        Mood and Affect: Mood normal.        Behavior: Behavior normal.     ED Results / Procedures / Treatments   Labs (all labs ordered are listed, but only abnormal results are displayed) Labs Reviewed  CBC WITH DIFFERENTIAL/PLATELET - Abnormal; Notable for the following components:      Result Value   Platelets 142 (*)    All other components within normal limits  COMPREHENSIVE METABOLIC PANEL - Abnormal; Notable for the following components:   Potassium 3.0 (*)    Glucose, Bld 107 (*)    Calcium 8.3 (*)    Total Protein 5.9 (*)    Albumin 3.4 (*)    All other components within normal limits  URINALYSIS, ROUTINE W REFLEX MICROSCOPIC - Abnormal; Notable for the following components:   Color, Urine AMBER (*)    Hgb urine dipstick MODERATE (*)    All other components within normal limits  LIPASE, BLOOD  PROTIME-INR  POC OCCULT BLOOD, ED  TYPE AND SCREEN    EKG None  Radiology No results found.  Procedures .Critical Care  Performed by: Mannie Stabile, PA-C Authorized by: Mannie Stabile, PA-C   Critical care provider statement:    Critical care time (minutes):  31   Critical care was necessary to treat or prevent imminent or life-threatening deterioration of the following conditions: GI bleed.   Critical care was time spent personally by me on the following activities:  Development of treatment plan with patient or surrogate, discussions with consultants, evaluation of patient's response to treatment, examination of patient, ordering and review of laboratory studies,  ordering and review of radiographic studies, ordering and performing treatments and interventions, pulse oximetry, re-evaluation of patient's condition and review of old charts   I assumed direction of critical care for this patient from another provider in my specialty: no     Care discussed with: admitting provider       Medications Ordered in ED Medications  potassium chloride SA (KLOR-CON M) CR tablet 40 mEq (40 mEq Oral Given 08/12/23 1332)    ED Course/ Medical Decision Making/ A&P Clinical Course as of 08/12/23 1536  Mon Aug 12, 2023  1252 Hemoglobin: 12.6 [CA]  1326 Potassium(!): 3.0 [CA]    Clinical Course User Index [CA] Mannie Stabile, PA-C                                 Medical Decision Making Amount and/or Complexity of Data Reviewed Labs: ordered. Decision-making details documented in ED Course. Radiology: ordered.  Risk Prescription drug management.   This patient presents to the ED for concern of rectal bleeding, this involves an extensive number of treatment options, and is a complaint that carries with it a high risk of complications and morbidity.  The differential diagnosis includes GI bleed, vaginal bleeding, etc  72 year old female presents to the ED due to bright red blood per rectum that started around 11 AM this morning.  Patient has a history of external hemorrhoids.  On Eliquis.  Admits to lightheadedness upon standing.  Upon arrival reassuring vitals.  Patient in no acute distress.  Rectal exam performed with chaperone in room.  Unable to obtain any stool. Numerous external hemorrhoids without evidence of thrombosis. Dried blood in briefs.  Performed external vaginal exam with no dried blood around opening.  Routine labs ordered.  CT angiogram abdomen/pelvis due to GI bleed.  Patient will likely require admission since she is high risk on Eliquis.  CBC with no leukocytosis.  Normal hemoglobin at 12.6.  CMP with normal renal function.  Hypokalemia  at 3.  Potassium repleted.  Lipase normal.  PT/INR normal. UA negative for signs of infection.  3:33 PM Discussed with Dr. Ewing Schlein with [facility] who will see patient.   Patient handed off to [employer], PA-C at shift change pending CTA and admission.  Co morbidities that complicate the patient evaluation  HTN, A. Fib, HLD   Social Determinants of Health:  Elderly age >59  Test / Admission - Considered:  Patient will require admission for GI bleed given she is high risk on Eliquis        Final Clinical Impression(s) / ED Diagnoses Final diagnoses:  Rectal bleeding    Rx / DC Orders ED Discharge Orders     None         Mannie Stabile, PA-C 08/12/23 1536    Rexford Maus, DO 08/12/23 1541

## 2023-08-12 NOTE — ED Triage Notes (Signed)
Patient c/o rectal bleeding that started spontaneously without BM. Hx of hemorrhoids, bleeding was controlled prior to EMS arrival, remains controlled at this time. Patient is on eliquis. BP 130/70, 98% RA, HR 70, RR 14. A&Ox4, ambulatory with EMS.

## 2023-08-12 NOTE — Assessment & Plan Note (Signed)
Despite GIB, BP running on high side in ED (160s-170s SBP). Cont BB Cont ARB Hold hydrochlorothiazide for the moment till we see how she does overnight

## 2023-08-12 NOTE — ED Notes (Signed)
Walked patient to the bathroom patient did well 

## 2023-08-12 NOTE — Assessment & Plan Note (Addendum)
See Dr. Ewing Schlein note Obs overnight Repeat CBC tomorrow AM Clear liquid diet No GIB seen on CTA If continues to not bleed overnight, probably okay to go home tomorrow, GI to determine when its okay to resume eliquis.

## 2023-08-12 NOTE — ED Notes (Signed)
Patient transported to CT via stretcher.

## 2023-08-13 DIAGNOSIS — K625 Hemorrhage of anus and rectum: Secondary | ICD-10-CM | POA: Diagnosis not present

## 2023-08-13 LAB — BASIC METABOLIC PANEL
Anion gap: 9 (ref 5–15)
BUN: 8 mg/dL (ref 8–23)
CO2: 31 mmol/L (ref 22–32)
Calcium: 7.9 mg/dL — ABNORMAL LOW (ref 8.9–10.3)
Chloride: 99 mmol/L (ref 98–111)
Creatinine, Ser: 1.02 mg/dL — ABNORMAL HIGH (ref 0.44–1.00)
GFR, Estimated: 58 mL/min — ABNORMAL LOW (ref >60)
Glucose, Bld: 101 mg/dL — ABNORMAL HIGH (ref 70–99)
Potassium: 2.6 mmol/L — CL (ref 3.5–5.1)
Sodium: 139 mmol/L (ref 135–145)

## 2023-08-13 LAB — CBC
HCT: 38.3 % (ref 36.0–46.0)
Hemoglobin: 12.6 g/dL (ref 12.0–15.0)
MCH: 28.6 pg (ref 26.0–34.0)
MCHC: 32.9 g/dL (ref 30.0–36.0)
MCV: 87 fL (ref 80.0–100.0)
Platelets: 139 10*3/uL — ABNORMAL LOW (ref 150–400)
RBC: 4.4 MIL/uL (ref 3.87–5.11)
RDW: 13.5 % (ref 11.5–15.5)
WBC: 5 10*3/uL (ref 4.0–10.5)
nRBC: 0 % (ref 0.0–0.2)

## 2023-08-13 LAB — MAGNESIUM: Magnesium: 1.1 mg/dL — ABNORMAL LOW (ref 1.7–2.4)

## 2023-08-13 LAB — POTASSIUM: Potassium: 3.5 mmol/L (ref 3.5–5.1)

## 2023-08-13 MED ORDER — ALUM & MAG HYDROXIDE-SIMETH 200-200-20 MG/5ML PO SUSP
30.0000 mL | Freq: Four times a day (QID) | ORAL | Status: DC | PRN
  Administered 2023-08-13: 30 mL via ORAL
  Filled 2023-08-13: qty 30

## 2023-08-13 MED ORDER — POTASSIUM CHLORIDE CRYS ER 20 MEQ PO TBCR
40.0000 meq | EXTENDED_RELEASE_TABLET | ORAL | Status: AC
Start: 2023-08-13 — End: 2023-08-13
  Administered 2023-08-13 (×2): 40 meq via ORAL
  Filled 2023-08-13 (×2): qty 2

## 2023-08-13 NOTE — Hospital Course (Addendum)
Kristen Wilson is a 72 y.o. female with a history of hypertension, hyperlipidemia, atrial fibrillation.  Patient presented secondary to bright red blood per rectum concerning for GI bleeding. [facility] consulted with initial recommendations for conservative management.  Patient hemodynamically stable.  Labs with hemoglobin stable in 12 g range, had mild hypokalemia that was replaced renal function stable GI advised conservative management and advised cardiology evaluation to see if patient can continue on aspirin or needs low-dose Eliquis for her A-fib, for discharge recommendation.  Discussed with Dr. Duke Salvia from cardiology in the current setting advised to hold Eliquis x 5 days unless otherwise advised by GI.  Patient was monitored additional night 08/15/23, and GI recommended colonoscopy> no active bleeding noted, suspect originally from diverticulosis, continue to hold apixaban 7 days, soft diet few days and okay for discharge and GI will arrange outpatient follow-up

## 2023-08-13 NOTE — Progress Notes (Signed)
Transition of Care [facility]) - Inpatient Brief Assessment   Patient Details  Name: Kristen Wilson MRN: [id number] Date of Birth: May 30, 1951  Transition of Care [facility]) CM/SW Contact:    Janae Bridgeman, RN Phone Number: 08/13/2023, 4:03 PM   Clinical Narrative: CM met with the patient at the bedside regarding patient's admission for GI bleed.  The patient plans to return home when medically stable for discharge.  No TOC needs at this time.  Medicare Observation letter provided to the patient at the bedside.   Transition of Care Asessment: Insurance and Status: (P) Insurance coverage has been reviewed Patient has primary care physician: (P) Yes Home environment has been reviewed: (P) from home with sister Prior level of function:: (P) Independent Prior/Current Home Services: (P) No current home services Social Drivers of Health Review: (P) SDOH reviewed interventions complete Readmission risk has been reviewed: (P) Yes Transition of care needs: (P) no transition of care needs at this time

## 2023-08-13 NOTE — Care Management Obs Status (Cosign Needed)
MEDICARE OBSERVATION STATUS NOTIFICATION   Patient Details  Name: BRISEYDA DELOSANGELES MRN: [id number] Date of Birth: 05/16/1951   Medicare Observation Status Notification Given:  Yes    Janae Bridgeman, RN 08/13/2023, 3:03 PM

## 2023-08-13 NOTE — Progress Notes (Signed)
PROGRESS NOTE    Kristen Wilson  KVQ:[id number] DOB: 17-Oct-1950 DOA: 08/12/2023 PCP: Gweneth Dimitri, MD   Brief Narrative: Kristen Wilson is a 72 y.o. female with a history of hypertension, hyperlipidemia, atrial fibrillation.  Patient presented secondary to bright red blood per rectum concerning for GI bleeding. [facility] consulted with initial recommendations for conservative management.   Assessment and Plan:  Bright red blood per rectum Recurrent. History of diverticulosis which is likely etiology. Eliquis use likely contributory. [facility] consulted and have recommended conservative management for now. CT bleeding scan is negative for evidence of GI bleeding, but does identify sigmoid colonic diverticulosis. Continued hematochezia overnight. No associated anemia at this time. -Follow-up GI recommendations for today  Paroxysmal atrial fibrillation Sinus rhythm. Rate control with metoprolol tartrate. Stroke prophylaxis with Eliquis, which is currently on hold secondary to GI bleeding. -Continue metoprolol tartrate  Primary hypertension Uncontrolled and elevated. -Continue irbesartan  Hyperlipidemia -Continue Lipitor  Hepatic steatosis Noted on imaging.  Cardiomegaly Mild and noted on imaging. Recommend PCP follow-up with consideration for Transthoracic Echocardiogram. Patient with recent left heart catheterization in 2023 which was significant for an LVEF of 55-60%  Depression -Continue vilazodone and Cymbalta   DVT prophylaxis: SCDs Code Status:   Code Status: Full Code Family Communication: None at bedside Disposition Plan: Discharge home pending ongoing GI recommendations   Consultants:  [facility]  Procedures:  None  Antimicrobials: None    Subjective: Patient reports an episode of bright red blood per rectum with clots this morning.  Objective: BP (!) 164/89 (BP Location: Right Arm)   Pulse 62   Temp 98.3 F (36.8 C) (Oral)   Resp  17   Ht 5\' 2"  (1.575 m)   Wt 63.5 kg   SpO2 99%   BMI 25.61 kg/m   Examination:  General exam: Appears calm and comfortable Respiratory system: Clear to auscultation. Respiratory effort normal. Cardiovascular system: S1 & S2 heard, RRR. No murmurs, rubs, gallops or clicks. Gastrointestinal system: Abdomen is nondistended, soft and nontender. Normal bowel sounds heard. Central nervous system: Alert and oriented. No focal neurological deficits. Musculoskeletal: No edema. No calf tenderness Skin: No cyanosis. No rashes Psychiatry: Judgement and insight appear normal. Mood & affect appropriate.    Data Reviewed: I have personally reviewed following labs and imaging studies  CBC Lab Results  Component Value Date   WBC 5.0 08/13/2023   RBC 4.40 08/13/2023   HGB 12.6 08/13/2023   HCT 38.3 08/13/2023   MCV 87.0 08/13/2023   MCH 28.6 08/13/2023   PLT 139 (L) 08/13/2023   MCHC 32.9 08/13/2023   RDW 13.5 08/13/2023   LYMPHSABS 1.5 08/12/2023   MONOABS 0.3 08/12/2023   EOSABS 0.3 08/12/2023   BASOSABS 0.1 08/12/2023     Last metabolic panel Lab Results  Component Value Date   NA 139 08/13/2023   K 2.6 (LL) 08/13/2023   CL 99 08/13/2023   CO2 31 08/13/2023   BUN 8 08/13/2023   CREATININE 1.02 (H) 08/13/2023   GLUCOSE 101 (H) 08/13/2023   GFRNONAA 58 (L) 08/13/2023   GFRAA >60 09/01/2019   CALCIUM 7.9 (L) 08/13/2023   PHOS 2.6 02/14/2022   PROT 5.9 (L) 08/12/2023   ALBUMIN 3.4 (L) 08/12/2023   BILITOT 0.8 08/12/2023   ALKPHOS 64 08/12/2023   AST 27 08/12/2023   ALT 20 08/12/2023   ANIONGAP 9 08/13/2023    GFR: Estimated Creatinine Clearance: 43 mL/min (A) (by C-G formula based on SCr of  1.02 mg/dL (H)).  No results found for this or any previous visit (from the past 240 hours).    Radiology Studies: CT ANGIO GI BLEED Result Date: 08/12/2023 CLINICAL DATA:  Lower gastrointestinal bleeding since this morning with blood in the stool. EXAM: CTA ABDOMEN AND PELVIS  WITHOUT AND WITH CONTRAST TECHNIQUE: Multidetector CT imaging of the abdomen and pelvis was performed using the standard protocol during bolus administration of intravenous contrast. Multiplanar reconstructed images and MIPs were obtained and reviewed to evaluate the vascular anatomy. RADIATION DOSE REDUCTION: This exam was performed according to the departmental dose-optimization program which includes automated exposure control, adjustment of the mA and/or kV according to patient size and/or use of iterative reconstruction technique. CONTRAST:  OMNIPAQUE IOHEXOL 350 MG/ML SOLN COMPARISON:  Abdomen and pelvis CT dated 10/31/2020 FINDINGS: VASCULAR Aorta: Atheromatous calcifications without aneurysm or significant stenosis. No dissection. Celiac: Patent without evidence of aneurysm, dissection, vasculitis or significant stenosis. SMA: Patent without evidence of aneurysm, dissection, vasculitis or significant stenosis. Renals: Both renal arteries are patent without evidence of aneurysm, dissection, vasculitis, fibromuscular dysplasia or significant stenosis. IMA: Patent without evidence of aneurysm, dissection, vasculitis or significant stenosis. Inflow: Atheromatous calcifications without aneurysm or significant stenosis. No dissection. Proximal Outflow: Bilateral common femoral and visualized portions of the superficial and profunda femoral arteries are patent without evidence of aneurysm, dissection, vasculitis or significant stenosis. Veins: No obvious venous abnormality within the limitations of this arterial phase study. Review of the MIP images confirms the above findings. NON-VASCULAR Lower chest: Mildly enlarged heart without significant change. Coronary artery calcifications. Clear lung bases. Hepatobiliary: Mild diffuse low-density of the liver without significant change. Cholecystectomy clips. Pancreas: Unremarkable. No pancreatic ductal dilatation or surrounding inflammatory changes. Spleen: Normal  in size without focal abnormality. Adrenals/Urinary Tract: Normal-appearing right adrenal gland. Stable mild left adrenal hyperplasia. Unremarkable kidneys, ureters and urinary bladder. Stomach/Bowel: Scattered ingested calcific density in the small bowel. No extravasated contrast in the bowel. Multiple sigmoid colon diverticula without evidence of diverticulitis. Unremarkable stomach and appendix. Lymphatic: No enlarged lymph nodes. Reproductive: Uterus and bilateral adnexa are unremarkable. Other: Tiny umbilical hernia containing fat. Musculoskeletal: Lumbar and lower thoracic spine degenerative changes. Mild right and mild-to-moderate left hip degenerative changes. IMPRESSION: 1. No evidence of active gastrointestinal bleeding. 2. Sigmoid colon diverticulosis. 3. Stable mild hepatic steatosis. 4. Stable mild cardiomegaly. 5. Atheromatous coronary artery calcifications. Electronically Signed   By: Beckie Salts M.D.   On: 08/12/2023 17:20      LOS: 0 days    Jacquelin Hawking, MD [facility] 08/13/2023, 7:52 AM   If 7PM-7AM, please contact night-coverage [email or web address]

## 2023-08-13 NOTE — Plan of Care (Signed)
  Problem: Education: Goal: Knowledge of General Education information will improve Description: Including pain rating scale, medication(s)/side effects and non-pharmacologic comfort measures Outcome: Progressing   Problem: Clinical Measurements: Goal: Ability to maintain clinical measurements within normal limits will improve Outcome: Progressing   Problem: Clinical Measurements: Goal: Will remain free from infection Outcome: Progressing    Problem: Elimination: Goal: Will not experience complications related to bowel motility Outcome: Progressing

## 2023-08-14 DIAGNOSIS — K625 Hemorrhage of anus and rectum: Secondary | ICD-10-CM | POA: Diagnosis not present

## 2023-08-14 DIAGNOSIS — K921 Melena: Secondary | ICD-10-CM | POA: Diagnosis not present

## 2023-08-14 LAB — CBC
HCT: 38.5 % (ref 36.0–46.0)
Hemoglobin: 12.6 g/dL (ref 12.0–15.0)
MCH: 28.6 pg (ref 26.0–34.0)
MCHC: 32.7 g/dL (ref 30.0–36.0)
MCV: 87.3 fL (ref 80.0–100.0)
Platelets: 145 10*3/uL — ABNORMAL LOW (ref 150–400)
RBC: 4.41 MIL/uL (ref 3.87–5.11)
RDW: 13.6 % (ref 11.5–15.5)
WBC: 5.2 10*3/uL (ref 4.0–10.5)
nRBC: 0 % (ref 0.0–0.2)

## 2023-08-14 LAB — POTASSIUM: Potassium: 3.5 mmol/L (ref 3.5–5.1)

## 2023-08-14 MED ORDER — ALPRAZOLAM 0.5 MG PO TABS
0.5000 mg | ORAL_TABLET | Freq: Three times a day (TID) | ORAL | Status: DC | PRN
  Administered 2023-08-14 – 2023-08-16 (×5): 0.5 mg via ORAL
  Filled 2023-08-14 (×5): qty 1

## 2023-08-14 MED ORDER — MAGNESIUM SULFATE 4 GM/100ML IV SOLN
4.0000 g | Freq: Once | INTRAVENOUS | Status: AC
  Administered 2023-08-14: 4 g via INTRAVENOUS
  Filled 2023-08-14: qty 100

## 2023-08-14 NOTE — Progress Notes (Signed)
Kristen Wilson 2:57 PM  Subjective: Patient had 2 episodes of a little bright red blood yesterday and today she had 3 with some bright red and brown stool and we had a long talk about diverticular bleeding talked about the options of surgery versus interventional radiology proceeding with angiograms and try to coil the bleeding lesions and the difficulties with colonoscopy in the face of bleeding we answered all of her questions and she is not having any pain and tolerating her diet well we even discussed her mother's case years ago and reassurance was given about her hemoglobin and her CT scan and her case discussed yesterday with the hospital team and today with her nurse we reviewed her last colonoscopy from 2021 as well  Objective: Vital signs stable afebrile no acute distress patient looks well not examined today hemoglobin stable at 12.6 BUN and creatinine normal  Assessment: Probable diverticular bleeding currently stable  Plan: Will continue present management recheck CBC in the morning see how she is doing and her primary hospital doctor will need to contact cardiologist regarding whether she can lower her dose of Eliquis or if she still needs it or if we can get by on a aspirin a day and once bleeding stops as long as they are comfortable holding it for 1 week that would be my recommendation and preference and I will check on tomorrow  [facility]  office [phone] After 5PM or if no answer call [phone]

## 2023-08-14 NOTE — Plan of Care (Signed)
A&Ox4. Meds per MAR. RA. Independent. GI consulted. See note. 1 BM noted this shift with small amount of bright red blood present. Plan for patient to go home 12/19 per GI/primary note. Patient resting in bed comfortably with call light in reach.  Problem: Education: Goal: Knowledge of General Education information will improve Description: Including pain rating scale, medication(s)/side effects and non-pharmacologic comfort measures Outcome: Progressing   Problem: Health Behavior/Discharge Planning: Goal: Ability to manage health-related needs will improve Outcome: Progressing   Problem: Coping: Goal: Level of anxiety will decrease Outcome: Progressing   Problem: Pain Management: Goal: General experience of comfort will improve Outcome: Progressing

## 2023-08-14 NOTE — Progress Notes (Signed)
PROGRESS NOTE    Kristen Wilson  XBJ:[id number] DOB: May 20, 1951 DOA: 08/12/2023 PCP: Gweneth Dimitri, MD   Brief Narrative: Kristen Wilson is a 72 y.o. female with a history of hypertension, hyperlipidemia, atrial fibrillation.  Patient presented secondary to bright red blood per rectum concerning for GI bleeding. [facility] consulted with initial recommendations for conservative management.   Assessment and Plan:  Bright red blood per rectum Recurrent. History of diverticulosis which is likely etiology. Eliquis use likely contributory. [facility] consulted and have recommended conservative management for now. CT bleeding scan is negative for evidence of GI bleeding, but does identify sigmoid colonic diverticulosis. Continued hematochezia overnight. No associated anemia at this time; hemoglobin remains stable. -Follow-up GI recommendations for today  Paroxysmal atrial fibrillation Sinus rhythm. Rate control with metoprolol tartrate. Stroke prophylaxis with Eliquis, which is currently on hold secondary to GI bleeding. -Continue metoprolol tartrate  Primary hypertension Uncontrolled and elevated. -Continue irbesartan  Hyperlipidemia -Continue Lipitor  Hepatic steatosis Noted on imaging.  Cardiomegaly Mild and noted on imaging. Recommend PCP follow-up with consideration for Transthoracic Echocardiogram. Patient with recent left heart catheterization in 2023 which was significant for an LVEF of 55-60%  Depression -Continue vilazodone and Cymbalta   DVT prophylaxis: SCDs Code Status:   Code Status: Full Code Family Communication: None at bedside Disposition Plan: Discharge home pending ongoing GI recommendations   Consultants:  [facility]  Procedures:  None  Antimicrobials: None    Subjective: Some ongoing bloody stool with bright red blood noted in bowel movement, but amount has decreased.  Objective: BP (!) 155/63 (BP Location: Right Arm)   Pulse 60    Temp 98 F (36.7 C) (Oral)   Resp 18   Ht 5\' 2"  (1.575 m)   Wt 63.5 kg   SpO2 100%   BMI 25.61 kg/m   Examination:  General exam: Appears calm and comfortable Respiratory system: Clear to auscultation. Respiratory effort normal. Cardiovascular system: S1 & S2 heard, RRR. No murmurs. Gastrointestinal system: Abdomen is nondistended, soft and nontender. Normal bowel sounds heard. Central nervous system: Alert and oriented. No focal neurological deficits. Musculoskeletal: No edema. No calf tenderness Psychiatry: Judgement and insight appear normal. Mood & affect appropriate.    Data Reviewed: I have personally reviewed following labs and imaging studies  CBC Lab Results  Component Value Date   WBC 5.2 08/14/2023   RBC 4.41 08/14/2023   HGB 12.6 08/14/2023   HCT 38.5 08/14/2023   MCV 87.3 08/14/2023   MCH 28.6 08/14/2023   PLT 145 (L) 08/14/2023   MCHC 32.7 08/14/2023   RDW 13.6 08/14/2023   LYMPHSABS 1.5 08/12/2023   MONOABS 0.3 08/12/2023   EOSABS 0.3 08/12/2023   BASOSABS 0.1 08/12/2023     Last metabolic panel Lab Results  Component Value Date   NA 139 08/13/2023   K 3.5 08/14/2023   CL 99 08/13/2023   CO2 31 08/13/2023   BUN 8 08/13/2023   CREATININE 1.02 (H) 08/13/2023   GLUCOSE 101 (H) 08/13/2023   GFRNONAA 58 (L) 08/13/2023   GFRAA >60 09/01/2019   CALCIUM 7.9 (L) 08/13/2023   PHOS 2.6 02/14/2022   PROT 5.9 (L) 08/12/2023   ALBUMIN 3.4 (L) 08/12/2023   BILITOT 0.8 08/12/2023   ALKPHOS 64 08/12/2023   AST 27 08/12/2023   ALT 20 08/12/2023   ANIONGAP 9 08/13/2023    GFR: Estimated Creatinine Clearance: 43 mL/min (A) (by C-G formula based on SCr of 1.02 mg/dL (H)).  No  results found for this or any previous visit (from the past 240 hours).    Radiology Studies: CT ANGIO GI BLEED Result Date: 08/12/2023 CLINICAL DATA:  Lower gastrointestinal bleeding since this morning with blood in the stool. EXAM: CTA ABDOMEN AND PELVIS WITHOUT AND WITH  CONTRAST TECHNIQUE: Multidetector CT imaging of the abdomen and pelvis was performed using the standard protocol during bolus administration of intravenous contrast. Multiplanar reconstructed images and MIPs were obtained and reviewed to evaluate the vascular anatomy. RADIATION DOSE REDUCTION: This exam was performed according to the departmental dose-optimization program which includes automated exposure control, adjustment of the mA and/or kV according to patient size and/or use of iterative reconstruction technique. CONTRAST:  OMNIPAQUE IOHEXOL 350 MG/ML SOLN COMPARISON:  Abdomen and pelvis CT dated 10/31/2020 FINDINGS: VASCULAR Aorta: Atheromatous calcifications without aneurysm or significant stenosis. No dissection. Celiac: Patent without evidence of aneurysm, dissection, vasculitis or significant stenosis. SMA: Patent without evidence of aneurysm, dissection, vasculitis or significant stenosis. Renals: Both renal arteries are patent without evidence of aneurysm, dissection, vasculitis, fibromuscular dysplasia or significant stenosis. IMA: Patent without evidence of aneurysm, dissection, vasculitis or significant stenosis. Inflow: Atheromatous calcifications without aneurysm or significant stenosis. No dissection. Proximal Outflow: Bilateral common femoral and visualized portions of the superficial and profunda femoral arteries are patent without evidence of aneurysm, dissection, vasculitis or significant stenosis. Veins: No obvious venous abnormality within the limitations of this arterial phase study. Review of the MIP images confirms the above findings. NON-VASCULAR Lower chest: Mildly enlarged heart without significant change. Coronary artery calcifications. Clear lung bases. Hepatobiliary: Mild diffuse low-density of the liver without significant change. Cholecystectomy clips. Pancreas: Unremarkable. No pancreatic ductal dilatation or surrounding inflammatory changes. Spleen: Normal in size without  focal abnormality. Adrenals/Urinary Tract: Normal-appearing right adrenal gland. Stable mild left adrenal hyperplasia. Unremarkable kidneys, ureters and urinary bladder. Stomach/Bowel: Scattered ingested calcific density in the small bowel. No extravasated contrast in the bowel. Multiple sigmoid colon diverticula without evidence of diverticulitis. Unremarkable stomach and appendix. Lymphatic: No enlarged lymph nodes. Reproductive: Uterus and bilateral adnexa are unremarkable. Other: Tiny umbilical hernia containing fat. Musculoskeletal: Lumbar and lower thoracic spine degenerative changes. Mild right and mild-to-moderate left hip degenerative changes. IMPRESSION: 1. No evidence of active gastrointestinal bleeding. 2. Sigmoid colon diverticulosis. 3. Stable mild hepatic steatosis. 4. Stable mild cardiomegaly. 5. Atheromatous coronary artery calcifications. Electronically Signed   By: Beckie Salts M.D.   On: 08/12/2023 17:20      LOS: 0 days    Jacquelin Hawking, MD [facility] 08/14/2023, 12:52 PM   If 7PM-7AM, please contact night-coverage [email or web address]

## 2023-08-14 NOTE — Progress Notes (Signed)
Mobility Specialist Progress Note:    08/14/23 0833  Mobility  Activity Ambulated independently in hallway  Level of Assistance Standby assist, set-up cues, supervision of patient - no hands on  Assistive Device None  Distance Ambulated (ft) 210 ft  Activity Response Tolerated well  Mobility Referral Yes  Mobility visit 1 Mobility  Mobility Specialist Start Time (ACUTE ONLY) [id number]  Mobility Specialist Stop Time (ACUTE ONLY) 0842  Mobility Specialist Time Calculation (min) (ACUTE ONLY) 9 min   Pt received in bed, agreeable to mobility session. Ambulated in hallway with SBA. No AD required. Tolerated well, asx throughout. Returned pt to room, left with all needs met.   Feliciana Rossetti Mobility Specialist Please contact via Special educational needs teacher or  Rehab office at [phone]

## 2023-08-14 NOTE — Plan of Care (Signed)
  Problem: Education: Goal: Knowledge of General Education information will improve Description: Including pain rating scale, medication(s)/side effects and non-pharmacologic comfort measures Outcome: Progressing   Problem: Clinical Measurements: Goal: Ability to maintain clinical measurements within normal limits will improve Outcome: Progressing Goal: Will remain free from infection Outcome: Progressing   Problem: Clinical Measurements: Goal: Will remain free from infection Outcome: Progressing

## 2023-08-15 DIAGNOSIS — Z8261 Family history of arthritis: Secondary | ICD-10-CM | POA: Diagnosis not present

## 2023-08-15 DIAGNOSIS — F32A Depression, unspecified: Secondary | ICD-10-CM | POA: Diagnosis present

## 2023-08-15 DIAGNOSIS — M810 Age-related osteoporosis without current pathological fracture: Secondary | ICD-10-CM | POA: Diagnosis present

## 2023-08-15 DIAGNOSIS — Z888 Allergy status to other drugs, medicaments and biological substances status: Secondary | ICD-10-CM | POA: Diagnosis not present

## 2023-08-15 DIAGNOSIS — E875 Hyperkalemia: Secondary | ICD-10-CM | POA: Diagnosis present

## 2023-08-15 DIAGNOSIS — Z79899 Other long term (current) drug therapy: Secondary | ICD-10-CM | POA: Diagnosis not present

## 2023-08-15 DIAGNOSIS — Z807 Family history of other malignant neoplasms of lymphoid, hematopoietic and related tissues: Secondary | ICD-10-CM | POA: Diagnosis not present

## 2023-08-15 DIAGNOSIS — E876 Hypokalemia: Secondary | ICD-10-CM | POA: Diagnosis present

## 2023-08-15 DIAGNOSIS — Z91040 Latex allergy status: Secondary | ICD-10-CM | POA: Diagnosis not present

## 2023-08-15 DIAGNOSIS — Z87891 Personal history of nicotine dependence: Secondary | ICD-10-CM | POA: Diagnosis not present

## 2023-08-15 DIAGNOSIS — K760 Fatty (change of) liver, not elsewhere classified: Secondary | ICD-10-CM | POA: Diagnosis present

## 2023-08-15 DIAGNOSIS — Z833 Family history of diabetes mellitus: Secondary | ICD-10-CM | POA: Diagnosis not present

## 2023-08-15 DIAGNOSIS — Z96652 Presence of left artificial knee joint: Secondary | ICD-10-CM | POA: Diagnosis present

## 2023-08-15 DIAGNOSIS — K625 Hemorrhage of anus and rectum: Secondary | ICD-10-CM | POA: Diagnosis not present

## 2023-08-15 DIAGNOSIS — K5730 Diverticulosis of large intestine without perforation or abscess without bleeding: Secondary | ICD-10-CM | POA: Diagnosis not present

## 2023-08-15 DIAGNOSIS — Z7901 Long term (current) use of anticoagulants: Secondary | ICD-10-CM | POA: Diagnosis not present

## 2023-08-15 DIAGNOSIS — Z818 Family history of other mental and behavioral disorders: Secondary | ICD-10-CM | POA: Diagnosis not present

## 2023-08-15 DIAGNOSIS — I10 Essential (primary) hypertension: Secondary | ICD-10-CM | POA: Diagnosis not present

## 2023-08-15 DIAGNOSIS — K921 Melena: Secondary | ICD-10-CM | POA: Diagnosis not present

## 2023-08-15 DIAGNOSIS — K5790 Diverticulosis of intestine, part unspecified, without perforation or abscess without bleeding: Secondary | ICD-10-CM | POA: Diagnosis not present

## 2023-08-15 DIAGNOSIS — K5731 Diverticulosis of large intestine without perforation or abscess with bleeding: Secondary | ICD-10-CM | POA: Diagnosis not present

## 2023-08-15 DIAGNOSIS — K644 Residual hemorrhoidal skin tags: Secondary | ICD-10-CM | POA: Diagnosis present

## 2023-08-15 DIAGNOSIS — Z9049 Acquired absence of other specified parts of digestive tract: Secondary | ICD-10-CM | POA: Diagnosis not present

## 2023-08-15 DIAGNOSIS — K649 Unspecified hemorrhoids: Secondary | ICD-10-CM | POA: Diagnosis not present

## 2023-08-15 DIAGNOSIS — I119 Hypertensive heart disease without heart failure: Secondary | ICD-10-CM | POA: Diagnosis present

## 2023-08-15 DIAGNOSIS — D62 Acute posthemorrhagic anemia: Secondary | ICD-10-CM | POA: Diagnosis not present

## 2023-08-15 DIAGNOSIS — I480 Paroxysmal atrial fibrillation: Secondary | ICD-10-CM | POA: Diagnosis present

## 2023-08-15 DIAGNOSIS — E785 Hyperlipidemia, unspecified: Secondary | ICD-10-CM | POA: Diagnosis not present

## 2023-08-15 LAB — CBC
HCT: 36.4 % (ref 36.0–46.0)
Hemoglobin: 12.2 g/dL (ref 12.0–15.0)
MCH: 29.5 pg (ref 26.0–34.0)
MCHC: 33.5 g/dL (ref 30.0–36.0)
MCV: 87.9 fL (ref 80.0–100.0)
Platelets: 138 10*3/uL — ABNORMAL LOW (ref 150–400)
RBC: 4.14 MIL/uL (ref 3.87–5.11)
RDW: 13.5 % (ref 11.5–15.5)
WBC: 4.8 10*3/uL (ref 4.0–10.5)
nRBC: 0 % (ref 0.0–0.2)

## 2023-08-15 LAB — BASIC METABOLIC PANEL
Anion gap: 4 — ABNORMAL LOW (ref 5–15)
BUN: 11 mg/dL (ref 8–23)
CO2: 28 mmol/L (ref 22–32)
Calcium: 8.4 mg/dL — ABNORMAL LOW (ref 8.9–10.3)
Chloride: 106 mmol/L (ref 98–111)
Creatinine, Ser: 0.89 mg/dL (ref 0.44–1.00)
GFR, Estimated: >60 mL/min (ref >60)
Glucose, Bld: 101 mg/dL — ABNORMAL HIGH (ref 70–99)
Potassium: 3.4 mmol/L — ABNORMAL LOW (ref 3.5–5.1)
Sodium: 138 mmol/L (ref 135–145)

## 2023-08-15 LAB — MAGNESIUM: Magnesium: 2.3 mg/dL (ref 1.7–2.4)

## 2023-08-15 MED ORDER — POTASSIUM CHLORIDE CRYS ER 20 MEQ PO TBCR
40.0000 meq | EXTENDED_RELEASE_TABLET | Freq: Once | ORAL | Status: AC
  Administered 2023-08-15: 40 meq via ORAL
  Filled 2023-08-15: qty 2

## 2023-08-15 NOTE — Plan of Care (Signed)
  Problem: Education: Goal: Knowledge of General Education information will improve Description: Including pain rating scale, medication(s)/side effects and non-pharmacologic comfort measures Outcome: Progressing   Problem: Clinical Measurements: Goal: Diagnostic test results will improve Outcome: Progressing Goal: Cardiovascular complication will be avoided Outcome: Progressing   Problem: Activity: Goal: Risk for activity intolerance will decrease Outcome: Progressing   Problem: Nutrition: Goal: Adequate nutrition will be maintained Outcome: Progressing   Problem: Coping: Goal: Level of anxiety will decrease Outcome: Progressing   Problem: Elimination: Goal: Will not experience complications related to bowel motility Outcome: Progressing

## 2023-08-15 NOTE — Progress Notes (Signed)
Kristen Wilson 4:41 PM  Subjective: Patient left a small bowel movement for me to see and there is really very minimal blood and the stool was brown and fairly formed and she has no new complaints and we answered all of her questions  Objective: Signs stable afebrile no acute distress patient looks well in okay spirits today seems less anxious not mentioned above we also discussed her blood pressure and her pulse and her Eliquis labs all stable  Assessment: Seemingly resolved diverticular bleeding  Plan: Okay with me to watch 1 more day and if still a question about any ongoing bleeding I offered her a colonoscopy by her primary gastroenterologist on Saturday even though she is not due for screening and even though I do not think she has any active bleeding anymore but certainly if bright red blood increased I would repeat CTA or proceed with nuclear bleeding study ASAP  [facility]  office [phone] After 5PM or if no answer call [phone]

## 2023-08-15 NOTE — Progress Notes (Signed)
PROGRESS NOTE Kristen Wilson  GNF:[id number] DOB: [birth year] DOA: 08/12/2023 PCP: Gweneth Dimitri, MD  Brief Narrative/Hospital Course: Kristen Wilson is a 72 y.o. female with a history of hypertension, hyperlipidemia, atrial fibrillation.  Patient presented secondary to bright red blood per rectum concerning for GI bleeding. [facility] consulted with initial recommendations for conservative management.  Patient hemodynamically stable.  Labs with hemoglobin stable in 12 g range, had mild hypokalemia that was replaced renal function stable GI advised conservative management and advised cardiology evaluation to see if patient can continue on aspirin or needs low-dose Eliquis for her A-fib, for discharge recommendation.  Discussed with Dr. Duke Salvia from cardiology in the current setting advised to hold Eliquis x 5 days unless otherwise advised by GI.     Subjective: Patient seen and examined this morning complains of ongoing rectal bleeding, somewhat slightly better, denies nausea vomiting Anxious. Assessment and Plan: Principal Problem:   BRBPR (bright red blood per rectum) Active Problems:   Hyperlipidemia   Hypertension   A-fib (HCC)   BRBPR Recurrent with history of diverticulosis likely etiology.  PTA on Eliquis held on admission-continue to hold x 5 days as per cardiology.  Continues to have some bleeding but hemoglobin has been stable.  Seen by GI on conservative management. Recent Labs  Lab 08/12/23 1207 08/13/23 0552 08/14/23 0909 08/15/23 0739  HGB 12.6 12.6 12.6 12.2  HCT 38.9 38.3 38.5 36.4   PAF HTN Cardiomegaly-mild per imaging: Last cath in 2023 with LVEF normal, rate controlled on home metoprolol, BP stable on Avapro.  PTA on Eliquis but held due to bleeding per rectum will discuss with cardiology before discharge for anticoagulation/antiplatelet recommendations.  Hypokalemia: Replaced  HLD: continue home statin  Hepatic steatosis Advised weight  loss  Depression: Continue Cymbalta as well as Adderall.  DVT prophylaxis: SCDs Start: 08/12/23 2005 Code Status:   Code Status: Full Code Family Communication: plan of care discussed with patient at bedside. Patient status is: Remains hospitalized because of severity of illness> change to inpatient due to ongoing rectal bleeding Level of care: Telemetry Medical   Dispo: The patient is from: home            Anticipated disposition: Anticipate discharge in next 24 hours if rectal bleeding improves   Objective: Vitals last 24 hrs: Vitals:   08/15/23 0452 08/15/23 0751 08/15/23 0855 08/15/23 0906  BP: (!) 156/72 136/84  (!) 156/72  Pulse: (!) 53 (!) 52 64   Resp: 18   18  Temp: 98 F (36.7 C)   98.4 F (36.9 C)  TempSrc: Oral   Oral  SpO2: 98% 100%  100%  Weight:      Height:       Weight change:   Physical Examination: General exam: alert awake,at baseline, older than stated age HEENT:Oral mucosa moist, Ear/Nose WNL grossly Respiratory system: Bilaterally clear BS,no use of accessory muscle Cardiovascular system: S1 & S2 +, No JVD. Gastrointestinal system: Abdomen soft,NT,ND, BS+ Nervous System: Alert, awake, moving all extremities,and following commands. Extremities: LE edema neg,distal peripheral pulses palpable and warm.  Skin: No rashes,no icterus. MSK: Normal muscle bulk,tone, power   Medications reviewed:  Scheduled Meds:  atorvastatin  20 mg Oral QHS   DULoxetine  60 mg Oral Daily   irbesartan  300 mg Oral Daily   metoprolol tartrate  50 mg Oral BID   pantoprazole  40 mg Oral Daily   Vilazodone HCl  10 mg Oral Daily  Continuous Infusions:   Diet  Order             DIET SOFT Room service appropriate? Yes; Fluid consistency: Thin  Diet effective now                  Unresulted Labs (From admission, onward)    None     Data Reviewed: I have personally reviewed following labs and imaging studies CBC: Recent Labs  Lab 08/12/23 1207 08/13/23 0552  08/14/23 0909 08/15/23 0739  WBC 5.4 5.0 5.2 4.8  NEUTROABS 3.3  --   --   --   HGB 12.6 12.6 12.6 12.2  HCT 38.9 38.3 38.5 36.4  MCV 88.6 87.0 87.3 87.9  PLT 142* 139* 145* 138*   Basic Metabolic Panel:  Recent Labs  Lab 08/12/23 1207 08/13/23 0552 08/13/23 1423 08/14/23 0909 08/15/23 0739  NA 141 139  --   --  138  K 3.0* 2.6* 3.5 3.5 3.4*  CL 98 99  --   --  106  CO2 31 31  --   --  28  GLUCOSE 107* 101*  --   --  101*  BUN 11 8  --   --  11  CREATININE 0.93 1.02*  --   --  0.89  CALCIUM 8.3* 7.9*  --   --  8.4*  MG  --  1.1*  --   --  2.3   GFR: Estimated Creatinine Clearance: 49.3 mL/min (by C-G formula based on SCr of 0.89 mg/dL). Liver Function Tests:  Recent Labs  Lab 08/12/23 1207  AST 27  ALT 20  ALKPHOS 64  BILITOT 0.8  PROT 5.9*  ALBUMIN 3.4*   Recent Labs  Lab 08/12/23 1207  LIPASE 24   No results for input(s): "AMMONIA" in the last 168 hours. Coagulation Profile:  Recent Labs  Lab 08/12/23 1207  INR 1.2   Antimicrobials/Microbiology: Anti-infectives (From admission, onward)    None         Component Value Date/Time   SDES URINE, CLEAN CATCH 06/03/2015 1051   SPECREQUEST none 06/03/2015 1051   CULT MULTIPLE SPECIES PRESENT, SUGGEST RECOLLECTION 06/03/2015 1051   REPTSTATUS 06/04/2015 FINAL 06/03/2015 1051     Radiology Studies: No results found.   LOS: 0 days   Total time spent in review of labs and imaging, patient evaluation, formulation of plan, documentation and communication with family: 35 minutes  Lanae Boast, MD [facility]  08/15/2023, 1:20 PM

## 2023-08-15 NOTE — Plan of Care (Signed)
  Problem: Education: Goal: Knowledge of General Education information will improve Description: Including pain rating scale, medication(s)/side effects and non-pharmacologic comfort measures Outcome: Progressing   Problem: Health Behavior/Discharge Planning: Goal: Ability to manage health-related needs will improve Outcome: Progressing   Problem: Clinical Measurements: Goal: Will remain free from infection Outcome: Progressing   Problem: Activity: Goal: Risk for activity intolerance will decrease Outcome: Progressing   Problem: Nutrition: Goal: Adequate nutrition will be maintained Outcome: Progressing   Problem: Elimination: Goal: Will not experience complications related to bowel motility Outcome: Progressing Goal: Will not experience complications related to urinary retention Outcome: Progressing

## 2023-08-15 NOTE — Progress Notes (Signed)
Mobility Specialist Progress Note:    08/15/23 0934  Mobility  Activity Ambulated with assistance in hallway  Level of Assistance Standby assist, set-up cues, supervision of patient - no hands on  Assistive Device None  Distance Ambulated (ft) 270 ft  Activity Response Tolerated well  Mobility Referral Yes  Mobility visit 1 Mobility  Mobility Specialist Start Time (ACUTE ONLY) [id number]  Mobility Specialist Stop Time (ACUTE ONLY) 0934  Mobility Specialist Time Calculation (min) (ACUTE ONLY) 10 min   Pt received in bed, agreeable to mobility session. Ambulated in hallway, SBA for safety, no AD required. Tolerated well, asx throughout. Returned pt to room, left with all needs met.   Feliciana Rossetti Mobility Specialist Please contact via Special educational needs teacher or  Rehab office at [phone]

## 2023-08-15 NOTE — Progress Notes (Addendum)
Patient admitted with GI bleed.  Cardiology recommendations regarding anticoagulation were requested.  She is currently in sinus rhythm and overall risk benefit in active bleeding favors holding anticoagulation for at least 5 days unless otherwise advised by GI.  Please let us know when she is discharged and we can arrange for outpatient follow up and consideration of Watchman device if necessary.  Xachary Hambly C. Duke Salvia, MD, [facility] 08/15/2023 9:14 AM

## 2023-08-16 DIAGNOSIS — K625 Hemorrhage of anus and rectum: Secondary | ICD-10-CM | POA: Diagnosis not present

## 2023-08-16 LAB — CBC
HCT: 35.9 % — ABNORMAL LOW (ref 36.0–46.0)
Hemoglobin: 11.6 g/dL — ABNORMAL LOW (ref 12.0–15.0)
MCH: 28.9 pg (ref 26.0–34.0)
MCHC: 32.3 g/dL (ref 30.0–36.0)
MCV: 89.3 fL (ref 80.0–100.0)
Platelets: 134 10*3/uL — ABNORMAL LOW (ref 150–400)
RBC: 4.02 MIL/uL (ref 3.87–5.11)
RDW: 13.6 % (ref 11.5–15.5)
WBC: 5.6 10*3/uL (ref 4.0–10.5)
nRBC: 0 % (ref 0.0–0.2)

## 2023-08-16 LAB — POTASSIUM: Potassium: 3.6 mmol/L (ref 3.5–5.1)

## 2023-08-16 MED ORDER — POTASSIUM CHLORIDE CRYS ER 20 MEQ PO TBCR
40.0000 meq | EXTENDED_RELEASE_TABLET | Freq: Two times a day (BID) | ORAL | Status: DC
  Administered 2023-08-16: 40 meq via ORAL
  Filled 2023-08-16: qty 2

## 2023-08-16 MED ORDER — PEG 3350-KCL-NA BICARB-NACL 420 G PO SOLR
4000.0000 mL | Freq: Once | ORAL | Status: AC
  Administered 2023-08-16: 4000 mL via ORAL
  Filled 2023-08-16: qty 4000

## 2023-08-16 MED ORDER — POTASSIUM CHLORIDE 20 MEQ PO PACK
40.0000 meq | PACK | Freq: Two times a day (BID) | ORAL | Status: DC
  Administered 2023-08-16: 40 meq via ORAL
  Filled 2023-08-16 (×2): qty 2

## 2023-08-16 NOTE — Progress Notes (Signed)
Mobility Specialist Progress Note:    08/16/23 0854  Mobility  Activity Ambulated with assistance in hallway  Level of Assistance Standby assist, set-up cues, supervision of patient - no hands on  Assistive Device None  Distance Ambulated (ft) 270 ft  Activity Response Tolerated well  Mobility Referral Yes  Mobility visit 1 Mobility  Mobility Specialist Start Time (ACUTE ONLY) 0853  Mobility Specialist Stop Time (ACUTE ONLY) 0859  Mobility Specialist Time Calculation (min) (ACUTE ONLY) 6 min   Pt received in bed, agreeable to mobility session. Ambulated in hallway w/o AD. Tolerated well, VSS throughout. Pt c/o knee pain from arthritis, otherwise asx. Returned pt to room, all needs met.  Feliciana Rossetti Mobility Specialist Please contact via Special educational needs teacher or  Rehab office at [phone]

## 2023-08-16 NOTE — Progress Notes (Signed)
Harless Nakayama Dunlevy 10:33 AM  Subjective: Patient with just a hair of blood still in her bowels no new complaints but really wants a colonoscopy just to be sure  Objective: Signs stable afebrile no acute distress abdomen is soft nontender hemoglobin very slight drop slight decrease calcium to creatinine okay  Assessment: Lower GI bleed almost certainly diverticuli  Plan: Will proceed with colonoscopy by Dr. Dulce Sellar tomorrow however if with her prep and clear liquid diet has not seen any more blood and is happy to go home and not have the procedure that is okay by me we will proceed as above  [facility]  office [phone] After 5PM or if no answer call [phone]

## 2023-08-16 NOTE — Progress Notes (Signed)
PROGRESS NOTE Kristen Wilson  AOZ:[id number] DOB: 04-24-1951 DOA: 08/12/2023 PCP: Gweneth Dimitri, MD  Brief Narrative/Hospital Course: Kristen Wilson is a 72 y.o. female with a history of hypertension, hyperlipidemia, atrial fibrillation.  Patient presented secondary to bright red blood per rectum concerning for GI bleeding. [facility] consulted with initial recommendations for conservative management.  Patient hemodynamically stable.  Labs with hemoglobin stable in 12 g range, had mild hypokalemia that was replaced renal function stable GI advised conservative management and advised cardiology evaluation to see if patient can continue on aspirin or needs low-dose Eliquis for her A-fib, for discharge recommendation.  Discussed with Dr. Duke Salvia from cardiology in the current setting advised to hold Eliquis x 5 days unless otherwise advised by GI.  Patient was monitored additional night 08/15/23    Subjective: Patient seen and examined this morning Overnight afebrile Stated she had "medium blood" in her stool no new complaints  Assessment and Plan: Principal Problem:   BRBPR (bright red blood per rectum) Active Problems:   Hyperlipidemia   Hypertension   A-fib (HCC)   Rectal bleeding   BRBPR Recurrent with history of diverticulosis likely etiology.  PTA on Eliquis held on admission-continue to hold x 5 days as per cardiology on discharge.Dr Ewing Schlein on board and noted plan for colonoscopy tomorrow but if with her.  And clear liquid diet she does not have any more blood-??  May be home and proceed outpatient.  Hemoglobin slightly downtrending will trend as below. Recent Labs  Lab 08/12/23 1207 08/13/23 0552 08/14/23 0909 08/15/23 0739 08/16/23 0459  HGB 12.6 12.6 12.6 12.2 11.6*  HCT 38.9 38.3 38.5 36.4 35.9*   PAF HTN Cardiomegaly-mild per imaging: Last cath in 2023 with LVEF normal, rate controlled on home metoprolol, BP stable on Avapro.  PTA on Eliquis but holding due to rectal  bleeding, discussed with cardiology advised to hold off x 5 days on discharge.    Hypokalemia: Recheck in a.m.  HLD: continue home statin  Hepatic steatosis Advise weight loss  Depression: Continue Cymbalta as well as Adderall.  DVT prophylaxis: SCDs Start: 08/12/23 2005 Code Status:   Code Status: Full Code Family Communication: plan of care discussed with patient at bedside. Patient status is: Remains hospitalized because of severity of illness> change to inpatient due to ongoing rectal bleeding Level of care: Telemetry Medical   Dispo: The patient is from: home            Anticipated disposition: Anticipate discharge tomorrow   Objective: Vitals last 24 hrs: Vitals:   08/15/23 1948 08/15/23 2116 08/16/23 0337 08/16/23 0751  BP: (!) 147/58  (!) 143/107 (!) 163/82  Pulse: 68 68 62 (!) 55  Resp: 18  18 17   Temp: 98 F (36.7 C)  98.1 F (36.7 C) 98.1 F (36.7 C)  TempSrc: Oral  Oral Oral  SpO2: 98%  97% 96%  Weight:      Height:       Weight change:   Physical Examination: General exam: alert awake, oriented  HEENT:Oral mucosa moist, Ear/Nose WNL grossly Respiratory system: Bilaterally clear BS,no use of accessory muscle Cardiovascular system: S1 & S2 +, No JVD. Gastrointestinal system: Abdomen soft,NT,ND, BS+ Nervous System: Alert, awake, moving all extremities,and following commands. Extremities: LE edema neg,distal peripheral pulses palpable and warm.  Skin: No rashes,no icterus. MSK: Normal muscle bulk,tone, power   Medications reviewed:  Scheduled Meds:  atorvastatin  20 mg Oral QHS   DULoxetine  60 mg Oral Daily  irbesartan  300 mg Oral Daily   metoprolol tartrate  50 mg Oral BID   pantoprazole  40 mg Oral Daily   polyethylene glycol-electrolytes  4,000 mL Oral Once   potassium chloride  40 mEq Oral BID   Vilazodone HCl  10 mg Oral Daily  Continuous Infusions:   Diet Order             Diet clear liquid Room service appropriate? Yes; Fluid  consistency: Thin  Diet effective now                  Unresulted Labs (From admission, onward)     Start     Ordered   08/17/23 0500  Basic metabolic panel  Tomorrow morning,   R        08/16/23 1040   08/17/23 0500  CBC  Daily,   R      08/16/23 1040          Data Reviewed: I have personally reviewed following labs and imaging studies CBC: Recent Labs  Lab 08/12/23 1207 08/13/23 0552 08/14/23 0909 08/15/23 0739 08/16/23 0459  WBC 5.4 5.0 5.2 4.8 5.6  NEUTROABS 3.3  --   --   --   --   HGB 12.6 12.6 12.6 12.2 11.6*  HCT 38.9 38.3 38.5 36.4 35.9*  MCV 88.6 87.0 87.3 87.9 89.3  PLT 142* 139* 145* 138* 134*   Basic Metabolic Panel:  Recent Labs  Lab 08/12/23 1207 08/13/23 0552 08/13/23 1423 08/14/23 0909 08/15/23 0739  NA 141 139  --   --  138  K 3.0* 2.6* 3.5 3.5 3.4*  CL 98 99  --   --  106  CO2 31 31  --   --  28  GLUCOSE 107* 101*  --   --  101*  BUN 11 8  --   --  11  CREATININE 0.93 1.02*  --   --  0.89  CALCIUM 8.3* 7.9*  --   --  8.4*  MG  --  1.1*  --   --  2.3   GFR: Estimated Creatinine Clearance: 49.3 mL/min (by C-G formula based on SCr of 0.89 mg/dL). Liver Function Tests:  Recent Labs  Lab 08/12/23 1207  AST 27  ALT 20  ALKPHOS 64  BILITOT 0.8  PROT 5.9*  ALBUMIN 3.4*   Recent Labs  Lab 08/12/23 1207  LIPASE 24   No results for input(s): "AMMONIA" in the last 168 hours. Coagulation Profile:  Recent Labs  Lab 08/12/23 1207  INR 1.2   Antimicrobials/Microbiology: Anti-infectives (From admission, onward)    None         Component Value Date/Time   SDES URINE, CLEAN CATCH 06/03/2015 1051   SPECREQUEST none 06/03/2015 1051   CULT MULTIPLE SPECIES PRESENT, SUGGEST RECOLLECTION 06/03/2015 1051   REPTSTATUS 06/04/2015 FINAL 06/03/2015 1051     Radiology Studies: No results found.   LOS: 1 day   Total time spent in review of labs and imaging, patient evaluation, formulation of plan, documentation and communication with  family: 35 minutes  Lanae Boast, MD [facility]  08/16/2023, 10:40 AM

## 2023-08-16 NOTE — Plan of Care (Signed)

## 2023-08-17 ENCOUNTER — Encounter (HOSPITAL_COMMUNITY): Payer: Self-pay | Admitting: Internal Medicine

## 2023-08-17 ENCOUNTER — Encounter (HOSPITAL_COMMUNITY)
Admission: EM | Disposition: A | Discharge status: Home / Self Care | Payer: Self-pay | Source: Home / Self Care | Attending: Internal Medicine

## 2023-08-17 ENCOUNTER — Inpatient Hospital Stay (HOSPITAL_COMMUNITY): Payer: Medicare Other | Admitting: Anesthesiology

## 2023-08-17 DIAGNOSIS — I10 Essential (primary) hypertension: Secondary | ICD-10-CM | POA: Diagnosis not present

## 2023-08-17 DIAGNOSIS — K625 Hemorrhage of anus and rectum: Secondary | ICD-10-CM | POA: Diagnosis not present

## 2023-08-17 DIAGNOSIS — K5730 Diverticulosis of large intestine without perforation or abscess without bleeding: Secondary | ICD-10-CM

## 2023-08-17 DIAGNOSIS — K649 Unspecified hemorrhoids: Secondary | ICD-10-CM

## 2023-08-17 DIAGNOSIS — D62 Acute posthemorrhagic anemia: Secondary | ICD-10-CM | POA: Diagnosis not present

## 2023-08-17 DIAGNOSIS — Z87891 Personal history of nicotine dependence: Secondary | ICD-10-CM

## 2023-08-17 HISTORY — PX: COLONOSCOPY WITH PROPOFOL: SHX5780

## 2023-08-17 LAB — BASIC METABOLIC PANEL
Anion gap: 4 — ABNORMAL LOW (ref 5–15)
Anion gap: 6 (ref 5–15)
BUN: 9 mg/dL (ref 8–23)
BUN: 8 mg/dL (ref 8–23)
CO2: 27 mmol/L (ref 22–32)
CO2: 26 mmol/L (ref 22–32)
Calcium: 9.1 mg/dL (ref 8.9–10.3)
Calcium: 9.1 mg/dL (ref 8.9–10.3)
Chloride: 110 mmol/L (ref 98–111)
Chloride: 107 mmol/L (ref 98–111)
Creatinine, Ser: 1.07 mg/dL — ABNORMAL HIGH (ref 0.44–1.00)
Creatinine, Ser: 0.91 mg/dL (ref 0.44–1.00)
GFR, Estimated: 55 mL/min — ABNORMAL LOW (ref >60)
GFR, Estimated: >60 mL/min (ref >60)
Glucose, Bld: 99 mg/dL (ref 70–99)
Glucose, Bld: 95 mg/dL (ref 70–99)
Potassium: 5.4 mmol/L — ABNORMAL HIGH (ref 3.5–5.1)
Potassium: 5.1 mmol/L (ref 3.5–5.1)
Sodium: 141 mmol/L (ref 135–145)
Sodium: 139 mmol/L (ref 135–145)

## 2023-08-17 LAB — CBC
HCT: 40.1 % (ref 36.0–46.0)
Hemoglobin: 13.1 g/dL (ref 12.0–15.0)
MCH: 29.2 pg (ref 26.0–34.0)
MCHC: 32.7 g/dL (ref 30.0–36.0)
MCV: 89.3 fL (ref 80.0–100.0)
Platelets: 175 10*3/uL (ref 150–400)
RBC: 4.49 MIL/uL (ref 3.87–5.11)
RDW: 13.7 % (ref 11.5–15.5)
WBC: 6.1 10*3/uL (ref 4.0–10.5)
nRBC: 0 % (ref 0.0–0.2)

## 2023-08-17 SURGERY — COLONOSCOPY WITH PROPOFOL
Anesthesia: Monitor Anesthesia Care

## 2023-08-17 MED ORDER — PROPOFOL 500 MG/50ML IV EMUL
INTRAVENOUS | Status: DC | PRN
  Administered 2023-08-17: 50 ug/kg/min via INTRAVENOUS

## 2023-08-17 MED ORDER — PROPOFOL 10 MG/ML IV BOLUS
INTRAVENOUS | Status: DC | PRN
  Administered 2023-08-17: 40 mg via INTRAVENOUS
  Administered 2023-08-17: 20 mg via INTRAVENOUS

## 2023-08-17 MED ORDER — SODIUM CHLORIDE 0.9 % IV SOLN: INTRAVENOUS | Status: DC

## 2023-08-17 MED ORDER — APIXABAN 5 MG PO TABS: 5.0000 mg | ORAL_TABLET | Freq: Two times a day (BID) | ORAL | Status: AC

## 2023-08-17 MED ORDER — LIDOCAINE 2% (20 MG/ML) 5 ML SYRINGE
INTRAMUSCULAR | Status: DC | PRN
  Administered 2023-08-17: 100 mg via INTRAVENOUS

## 2023-08-17 SURGICAL SUPPLY — 20 items
ELECT REM PT RETURN 9FT ADLT (ELECTROSURGICAL)
ELECTRODE REM PT RTRN 9FT ADLT (ELECTROSURGICAL) IMPLANT
FCP BXJMBJMB 240X2.8X (CUTTING FORCEPS)
FLOOR PAD 36X40 (MISCELLANEOUS) ×1
FORCEPS BIOP RAD 4 LRG CAP 4 (CUTTING FORCEPS) IMPLANT
FORCEPS BXJMBJMB 240X2.8X (CUTTING FORCEPS) IMPLANT
INJECTOR/SNARE I SNARE (MISCELLANEOUS) IMPLANT
LUBRICANT JELLY 4.5OZ STERILE (MISCELLANEOUS) IMPLANT
MANIFOLD NEPTUNE II (INSTRUMENTS) IMPLANT
NDL SCLEROTHERAPY 25GX240 (NEEDLE) IMPLANT
NEEDLE SCLEROTHERAPY 25GX240 (NEEDLE) IMPLANT
PAD FLOOR 36X40 (MISCELLANEOUS) ×2 IMPLANT
PROBE APC STR FIRE (PROBE) IMPLANT
PROBE INJECTION GOLD 7FR (MISCELLANEOUS) IMPLANT
SNARE ROTATE MED OVAL 20MM (MISCELLANEOUS) IMPLANT
SYR 50ML LL SCALE MARK (SYRINGE) IMPLANT
TRAP SPECIMEN MUCOUS 40CC (MISCELLANEOUS) IMPLANT
TUBING ENDO SMARTCAP PENTAX (MISCELLANEOUS) IMPLANT
TUBING IRRIGATION ENDOGATOR (MISCELLANEOUS) ×2 IMPLANT
WATER STERILE IRR 1000ML POUR (IV SOLUTION) IMPLANT

## 2023-08-17 NOTE — Interval H&P Note (Signed)
History and Physical Interval Note:  08/17/2023 10:35 AM  Kristen Wilson  has presented today for surgery, with the diagnosis of Lower GI bleed.  The various methods of treatment have been discussed with the patient and family. After consideration of risks, benefits and other options for treatment, the patient has consented to  Procedure(s): COLONOSCOPY WITH PROPOFOL (N/A) as a surgical intervention.  The patient's history has been reviewed, patient examined, no change in status, stable for surgery.  I have reviewed the patient's chart and labs.  Questions were answered to the patient's satisfaction.     Freddy Jaksch

## 2023-08-17 NOTE — Op Note (Signed)
[facility] Patient Name: Kristen Wilson Procedure Date : 08/17/2023 MRN: [id number] Attending MD: Willis Modena , MD, [id number] Date of Birth: November 17, 1950 CSN: [id number] Age: 72 Admit Type: Inpatient Procedure:                Colonoscopy Indications:              Hematochezia, Acute post hemorrhagic anemia Providers:                Willis Modena, MD, Suzy Bouchard, RN, Kandice Robinsons, Technician Referring MD:             [facility] Medicines:                Monitored Anesthesia Care Complications:            No immediate complications. Estimated Blood Loss:     Estimated blood loss: none. Procedure:                Pre-Anesthesia Assessment:                           - Prior to the procedure, a History and Physical                            was performed, and patient medications and                            allergies were reviewed. The patient's tolerance of                            previous anesthesia was also reviewed. The risks                            and benefits of the procedure and the sedation                            options and risks were discussed with the patient.                            All questions were answered, and informed consent                            was obtained. Prior Anticoagulants: The patient has                            taken Eliquis (apixaban), last dose was 5 days                            prior to procedure. ASA Grade Assessment: III - A                            patient with severe systemic disease. After  reviewing the risks and benefits, the patient was                            deemed in satisfactory condition to undergo the                            procedure.                           After obtaining informed consent, the colonoscope                            was passed under direct vision. Throughout the                            procedure, the  patient's blood pressure, pulse, and                            oxygen saturations were monitored continuously. The                            [id number]-HQ190L ([id number]) Olympus colonoscope was                            introduced through the anus and advanced to the the                            cecum, identified by appendiceal orifice and                            ileocecal valve. The ileocecal valve, appendiceal                            orifice, and rectum were photographed. The entire                            colon was examined. The colonoscopy was performed                            without difficulty. The patient tolerated the                            procedure well. The quality of the bowel                            preparation was good. Scope In: 11:13:54 AM Scope Out: 11:27:28 AM Scope Withdrawal Time: 0 hours 7 minutes 59 seconds  Total Procedure Duration: 0 hours 13 minutes 34 seconds  Findings:      Hemorrhoids were found on perianal exam.      A few small-mouthed diverticula were found in the sigmoid colon and       descending colon.      No old or fresh blood was seen to the extent of our examination.      Colon otherwise normal; no other polyps, masses, vascular ectasias, or  inflammatory changes were seen. Impression:               - Hemorrhoids found on perianal exam.                           - Diverticulosis in the sigmoid colon and in the                            descending colon.                           - No specimens collected.                           - The exam was otherwise normal to the cecum.                           - Bleeding highly likely diverticular in origin,                            and has currently stopped. Recommendation:           - Return patient to hospital ward for ongoing care.                           - Mechanical soft diet today.                           - Continue present medications.                           - Resume  Eliquis (apixaban) at prior dose in 1 week.                           - Ok to discharge home today from GI perspective.                           [facility] will sign-off; we can arrange outpatient                            follow-up with [place]; please call with any questions;                            thank you for the consultation. Procedure Code(s):        --- Professional ---                           [phone], Colonoscopy, flexible; diagnostic, including                            collection of specimen(s) by brushing or washing,                            when performed (separate procedure) Diagnosis Code(s):        --- Professional ---  K64.9, Unspecified hemorrhoids                           K92.1, Melena (includes Hematochezia)                           D62, Acute posthemorrhagic anemia                           K57.30, Diverticulosis of large intestine without                            perforation or abscess without bleeding CPT copyright 2022 American [employer] Association. All rights reserved. The codes documented in this report are preliminary and upon coder review may  be revised to meet current compliance requirements. Willis Modena, MD 08/17/2023 12:01:17 PM This report has been signed electronically. Number of Addenda: 0

## 2023-08-17 NOTE — Anesthesia Preprocedure Evaluation (Signed)
Anesthesia Evaluation  Patient identified by MRN, date of birth, ID band Patient awake    Reviewed: Allergy & Precautions, H&P , NPO status , Patient's Chart, lab work & pertinent test results  Airway Mallampati: II   Neck ROM: full    Dental   Pulmonary shortness of breath, former smoker   breath sounds clear to auscultation       Cardiovascular hypertension, + angina  + dysrhythmias Atrial Fibrillation  Rhythm:regular Rate:Normal     Neuro/Psych  PSYCHIATRIC DISORDERS Anxiety Depression     Neuromuscular disease    GI/Hepatic hiatal hernia,GERD  ,,GI bleeding   Endo/Other    Renal/GU      Musculoskeletal  (+) Arthritis ,    Abdominal   Peds  Hematology   Anesthesia Other Findings   Reproductive/Obstetrics                             Anesthesia Physical Anesthesia Plan  ASA: 3  Anesthesia Plan: MAC   Post-op Pain Management:    Induction: Intravenous  PONV Risk Score and Plan: 2 and Propofol infusion and Treatment may vary due to age or medical condition  Airway Management Planned: Nasal Cannula  Additional Equipment:   Intra-op Plan:   Post-operative Plan:   Informed Consent: I have reviewed the patients History and Physical, chart, labs and discussed the procedure including the risks, benefits and alternatives for the proposed anesthesia with the patient or authorized representative who has indicated his/her understanding and acceptance.     Dental advisory given  Plan Discussed with: CRNA, Anesthesiologist and Surgeon  Anesthesia Plan Comments:        Anesthesia Quick Evaluation

## 2023-08-17 NOTE — Discharge Summary (Signed)
Physician Discharge Summary  Kristen Wilson WUJ:[id number] DOB: 01-Jun-1951 DOA: 08/12/2023  PCP: Gweneth Dimitri, MD  Admit date: 08/12/2023 Discharge date: 08/17/2023 Recommendations for Outpatient Follow-up:  Follow up with PCP in 1 weeks-call for appointment Please obtain BMP/CBC in one week Please hold  Eliquis 7 more days  Discharge Dispo: Home Discharge Condition: Stable Code Status:   Code Status: Full Code Diet recommendation:  Diet Order             DIET DYS 3 Fluid consistency: Thin  Diet effective now                    Brief/Interim Summary: Kristen Wilson is a 72 y.o. female with a history of hypertension, hyperlipidemia, atrial fibrillation.  Patient presented secondary to bright red blood per rectum concerning for GI bleeding. [facility] consulted with initial recommendations for conservative management.  Patient hemodynamically stable.  Labs with hemoglobin stable in 12 g range, had mild hypokalemia that was replaced renal function stable GI advised conservative management and advised cardiology evaluation to see if patient can continue on aspirin or needs low-dose Eliquis for her A-fib, for discharge recommendation.  Discussed with Dr. Duke Salvia from cardiology in the current setting advised to hold Eliquis x 5 days unless otherwise advised by GI.  Patient was monitored additional night 08/15/23, and GI recommended colonoscopy> no active bleeding noted, suspect originally from diverticulosis, continue to hold apixaban 7 days, soft diet few days and okay for discharge and GI will arrange outpatient follow-up    Discharge Diagnoses:  Principal Problem:   BRBPR (bright red blood per rectum) Active Problems:   Hyperlipidemia   Hypertension   A-fib (HCC)   Rectal bleeding  BRBPR: Recurrent with history of diverticulosis likely etiology.  Patient was monitored additional night 08/15/23, and GI recommended colonoscopy> no active bleeding noted, suspect originally from  diverticulosis, continue to hold apixaban 7 more days, cont soft diet few days and okay for discharge and GI will arrange outpatient follow-up  PAF HTN Cardiomegaly-mild per imaging: Last cath in 2023 with LVEF normal, rate controlled on home metoprolol, BP stable on Avapro.  PTA on Eliquis but holding due to rectal bleeding, discussed with cardiology advised to hold off x 5 days on discharge.     Hypokalemia-resolved Hyperkalemia: Recheck k before d/c   HLD: continue home statin   Hepatic steatosis Advise weight loss   Depression: Continue Cymbalta as well as Adderall   Consults: GI  Subjective: Aaox3, resting well  Discharge Exam: Vitals:   08/17/23 1140 08/17/23 1150  BP: 126/63 (!) 145/83  Pulse: (!) 57 60  Resp: 17 18  Temp:    SpO2: 100% 95%   General: Pt is alert, awake, not in acute distress Cardiovascular: RRR, S1/S2 +, no rubs, no gallops Respiratory: CTA bilaterally, no wheezing, no rhonchi Abdominal: Soft, NT, ND, bowel sounds + Extremities: no edema, no cyanosis  Discharge Instructions  Discharge Instructions     Discharge instructions   Complete by: As directed    Please call call MD or return to ER for similar or worsening recurring problem that brought you to hospital or if any fever,nausea/vomiting,abdominal pain, uncontrolled pain, chest pain,  shortness of breath or any other alarming symptoms.  Please hold  Eliquis 7 more days  Please follow-up your doctor as instructed in a week time and call the office for appointment.  Please avoid alcohol, smoking, or any other illicit substance and maintain healthy habits including taking  your regular medications as prescribed.  You were cared for by a hospitalist during your hospital stay. If you have any questions about your discharge medications or the care you received while you were in the hospital after you are discharged, you can call the unit and ask to speak with the hospitalist on call if the  hospitalist that took care of you is not available.  Once you are discharged, your primary care physician will handle any further medical issues. Please note that NO REFILLS for any discharge medications will be authorized once you are discharged, as it is imperative that you return to your primary care physician (or establish a relationship with a primary care physician if you do not have one) for your aftercare needs so that they can reassess your need for medications and monitor your lab values   Increase activity slowly   Complete by: As directed       Allergies as of 08/17/2023       Reactions   Latex Hives   Nsaids    Aleve / Ibuprofen  - causes Blood Pressure to rise   Percocet [oxycodone-acetaminophen] Nausea And Vomiting        Medication List     TAKE these medications    ALPRAZolam 0.5 MG tablet Commonly known as: XANAX Take 0.5 mg by mouth daily as needed for anxiety.   apixaban 5 MG Tabs tablet Commonly known as: ELIQUIS Take 1 tablet (5 mg total) by mouth 2 (two) times daily. Please hold  Eliquis 7 more days Start taking on: August 24, 2023 What changed:  additional instructions These instructions start on August 24, 2023. If you are unsure what to do until then, ask your doctor or other care provider.   atorvastatin 20 MG tablet Commonly known as: LIPITOR Take 1 tablet (20 mg total) by mouth daily.   dicyclomine 20 MG tablet Commonly known as: BENTYL Take 20 mg by mouth 2 (two) times daily as needed.   DULoxetine 60 MG capsule Commonly known as: CYMBALTA Take 120 mg by mouth 2 (two) times daily.   hydrochlorothiazide 25 MG tablet Commonly known as: HYDRODIURIL Take 25 mg by mouth daily.   irbesartan 300 MG tablet Commonly known as: AVAPRO Take 300 mg by mouth daily.   metoprolol tartrate 50 MG tablet Commonly known as: LOPRESSOR Take 1 tablet (50 mg total) by mouth 2 (two) times daily.   multivitamin tablet Take 1 tablet by mouth  daily.   pantoprazole 40 MG tablet Commonly known as: PROTONIX Take 40 mg by mouth daily.   Vilazodone HCl 10 MG Tabs Commonly known as: VIIBRYD Take 10 mg by mouth daily.   VITAMIN B-12 PO Take 1 tablet by mouth daily.   Vitamin D3 50 MCG (2000 UT) Tabs Take 2,000 Units by mouth daily.        Follow-up Information     Gweneth Dimitri, MD Follow up in 1 week(s).   Specialty: Family Medicine Contact information: [address] [place] [address] [phone]                Allergies  Allergen Reactions   Latex Hives   Nsaids     Aleve / Ibuprofen  - causes Blood Pressure to rise   Percocet [Oxycodone-Acetaminophen] Nausea And Vomiting    The results of significant diagnostics from this hospitalization (including imaging, microbiology, ancillary and laboratory) are listed below for reference.    Microbiology: No results found for this or any previous  visit (from the past 240 hours).  Procedures/Studies: CT ANGIO GI BLEED Result Date: 08/12/2023 CLINICAL DATA:  Lower gastrointestinal bleeding since this morning with blood in the stool. EXAM: CTA ABDOMEN AND PELVIS WITHOUT AND WITH CONTRAST TECHNIQUE: Multidetector CT imaging of the abdomen and pelvis was performed using the standard protocol during bolus administration of intravenous contrast. Multiplanar reconstructed images and MIPs were obtained and reviewed to evaluate the vascular anatomy. RADIATION DOSE REDUCTION: This exam was performed according to the departmental dose-optimization program which includes automated exposure control, adjustment of the mA and/or kV according to patient size and/or use of iterative reconstruction technique. CONTRAST:  OMNIPAQUE IOHEXOL 350 MG/ML SOLN COMPARISON:  Abdomen and pelvis CT dated 10/31/2020 FINDINGS: VASCULAR Aorta: Atheromatous calcifications without aneurysm or significant stenosis. No dissection. Celiac: Patent without evidence of aneurysm, dissection,  vasculitis or significant stenosis. SMA: Patent without evidence of aneurysm, dissection, vasculitis or significant stenosis. Renals: Both renal arteries are patent without evidence of aneurysm, dissection, vasculitis, fibromuscular dysplasia or significant stenosis. IMA: Patent without evidence of aneurysm, dissection, vasculitis or significant stenosis. Inflow: Atheromatous calcifications without aneurysm or significant stenosis. No dissection. Proximal Outflow: Bilateral common femoral and visualized portions of the superficial and profunda femoral arteries are patent without evidence of aneurysm, dissection, vasculitis or significant stenosis. Veins: No obvious venous abnormality within the limitations of this arterial phase study. Review of the MIP images confirms the above findings. NON-VASCULAR Lower chest: Mildly enlarged heart without significant change. Coronary artery calcifications. Clear lung bases. Hepatobiliary: Mild diffuse low-density of the liver without significant change. Cholecystectomy clips. Pancreas: Unremarkable. No pancreatic ductal dilatation or surrounding inflammatory changes. Spleen: Normal in size without focal abnormality. Adrenals/Urinary Tract: Normal-appearing right adrenal gland. Stable mild left adrenal hyperplasia. Unremarkable kidneys, ureters and urinary bladder. Stomach/Bowel: Scattered ingested calcific density in the small bowel. No extravasated contrast in the bowel. Multiple sigmoid colon diverticula without evidence of diverticulitis. Unremarkable stomach and appendix. Lymphatic: No enlarged lymph nodes. Reproductive: Uterus and bilateral adnexa are unremarkable. Other: Tiny umbilical hernia containing fat. Musculoskeletal: Lumbar and lower thoracic spine degenerative changes. Mild right and mild-to-moderate left hip degenerative changes. IMPRESSION: 1. No evidence of active gastrointestinal bleeding. 2. Sigmoid colon diverticulosis. 3. Stable mild hepatic steatosis. 4.  Stable mild cardiomegaly. 5. Atheromatous coronary artery calcifications. Electronically Signed   By: Beckie Salts M.D.   On: 08/12/2023 17:20    Labs: BNP (last 3 results) No results for input(s): "BNP" in the last 8760 hours. Basic Metabolic Panel: Recent Labs  Lab 08/12/23 1207 08/13/23 0552 08/13/23 1423 08/14/23 0909 08/15/23 0739 08/16/23 0459 08/17/23 0617  NA 141 139  --   --  138  --  141  K 3.0* 2.6* 3.5 3.5 3.4* 3.6 5.4*  CL 98 99  --   --  106  --  110  CO2 31 31  --   --  28  --  27  GLUCOSE 107* 101*  --   --  101*  --  99  BUN 11 8  --   --  11  --  9  CREATININE 0.93 1.02*  --   --  0.89  --  1.07*  CALCIUM 8.3* 7.9*  --   --  8.4*  --  9.1  MG  --  1.1*  --   --  2.3  --   --    Liver Function Tests: Recent Labs  Lab 08/12/23 1207  AST 27  ALT 20  ALKPHOS 64  BILITOT 0.8  PROT 5.9*  ALBUMIN 3.4*   Recent Labs  Lab 08/12/23 1207  LIPASE 24   No results for input(s): "AMMONIA" in the last 168 hours. CBC: Recent Labs  Lab 08/12/23 1207 08/13/23 0552 08/14/23 0909 08/15/23 0739 08/16/23 0459 08/17/23 0617  WBC 5.4 5.0 5.2 4.8 5.6 6.1  NEUTROABS 3.3  --   --   --   --   --   HGB 12.6 12.6 12.6 12.2 11.6* 13.1  HCT 38.9 38.3 38.5 36.4 35.9* 40.1  MCV 88.6 87.0 87.3 87.9 89.3 89.3  PLT 142* 139* 145* 138* 134* 175  Urinalysis    Component Value Date/Time   COLORURINE AMBER (A) 08/12/2023 1301   APPEARANCEUR CLEAR 08/12/2023 1301   LABSPEC 1.019 08/12/2023 1301   PHURINE 7.0 08/12/2023 1301   GLUCOSEU NEGATIVE 08/12/2023 1301   HGBUR MODERATE (A) 08/12/2023 1301   BILIRUBINUR NEGATIVE 08/12/2023 1301   KETONESUR NEGATIVE 08/12/2023 1301   PROTEINUR NEGATIVE 08/12/2023 1301   NITRITE NEGATIVE 08/12/2023 1301   LEUKOCYTESUR NEGATIVE 08/12/2023 1301   Sepsis Labs Recent Labs  Lab 08/14/23 0909 08/15/23 0739 08/16/23 0459 08/17/23 0617  WBC 5.2 4.8 5.6 6.1   Microbiology No results found for this or any previous visit (from the  past 240 hours).   Time coordinating discharge: 25 minutes  SIGNED: Lanae Boast, MD  [facility] 08/17/2023, 12:15 PM  If 7PM-7AM, please contact night-coverage [email or web address]

## 2023-08-17 NOTE — Transfer of Care (Signed)
Immediate Anesthesia Transfer of Care Note  Patient: Kristen Wilson  Procedure(s) Performed: COLONOSCOPY WITH PROPOFOL  Patient Location: PACU  Anesthesia Type:General  Level of Consciousness: awake, oriented, and patient cooperative  Airway & Oxygen Therapy: Patient Spontanous Breathing and Patient connected to nasal cannula oxygen  Post-op Assessment: Report given to RN and Post -op Vital signs reviewed and stable  Post vital signs: Reviewed and stable  Last Vitals:  Vitals Value Taken Time  BP    Temp    Pulse    Resp 16 08/17/23 1138  SpO2    Vitals shown include unfiled device data.  Last Pain:  Vitals:   08/17/23 1009  TempSrc: Temporal  PainSc: 0-No pain      Patients Stated Pain Goal: 0 (08/16/23 2331)  Complications: No notable events documented.

## 2023-08-19 NOTE — Anesthesia Postprocedure Evaluation (Signed)
Anesthesia Post Note  Patient: Kristen Wilson  Procedure(s) Performed: COLONOSCOPY WITH PROPOFOL     Patient location during evaluation: PACU Anesthesia Type: MAC Level of consciousness: awake and alert Pain management: pain level controlled Vital Signs Assessment: post-procedure vital signs reviewed and stable Respiratory status: spontaneous breathing, nonlabored ventilation, respiratory function stable and patient connected to nasal cannula oxygen Cardiovascular status: stable and blood pressure returned to baseline Postop Assessment: no apparent nausea or vomiting Anesthetic complications: no   No notable events documented.  Last Vitals:  Vitals:   08/17/23 1150 08/17/23 1229  BP: (!) 145/83 (!) 170/81  Pulse: 60 (!) 58  Resp: 18 19  Temp:  (!) 36.4 C  SpO2: 95% 100%    Last Pain:  Vitals:   08/17/23 1229  TempSrc: Oral  PainSc:                  Keldrick Pomplun S

## 2023-08-21 ENCOUNTER — Encounter (HOSPITAL_COMMUNITY): Payer: Self-pay | Admitting: Gastroenterology

## 2023-08-22 ENCOUNTER — Encounter: Payer: Self-pay | Admitting: *Deleted

## 2023-09-23 ENCOUNTER — Telehealth: Payer: Self-pay | Admitting: Cardiology

## 2023-09-23 ENCOUNTER — Other Ambulatory Visit (HOSPITAL_COMMUNITY): Payer: Self-pay

## 2023-09-23 NOTE — Telephone Encounter (Signed)
Pt c/o medication issue:  1. Name of Medication:   apixaban (ELIQUIS) 5 MG TABS tablet    2. How are you currently taking this medication (dosage and times per day)? As written   3. Are you having a reaction (difficulty breathing--STAT)? No   4. What is your medication issue? Pt called in stating this med went up to $300 and she is unable to afford, she would like to discuss other options.

## 2023-09-23 NOTE — Telephone Encounter (Signed)
1.  Patient assistance.  2.  May need to meet her yearly deductible 3.  Alternatives to consider Xarelto or Coumadin -she can first find the cost and I can discuss the medication that she can afford.   Samiah Ricklefs [place], DO, [facility]

## 2023-09-23 NOTE — Telephone Encounter (Signed)
Based on test claims, patient has a high deductible Medicare plan. Unfortunately, all the PAP options for DOAC's require patient to meet a 3-4% out of pocket spending on drugs for 2025 based on their annual income. Patient's likely won't meet any PAP requirements until later on in the year once they have paid some towards their deductible. Patient would likely benefit from setting up a Medicare payment plan with their insurance. Patient can contact the customer service number on the back of their insurance card and request to setup a payment plan where the deductible would be split up into payments over time. I hope this makes sense. Please reach back out if you have any additional questions or concerns.

## 2023-09-29 DIAGNOSIS — R0982 Postnasal drip: Secondary | ICD-10-CM | POA: Diagnosis not present

## 2023-09-29 DIAGNOSIS — J029 Acute pharyngitis, unspecified: Secondary | ICD-10-CM | POA: Diagnosis not present

## 2023-09-29 DIAGNOSIS — R051 Acute cough: Secondary | ICD-10-CM | POA: Diagnosis not present

## 2023-09-29 DIAGNOSIS — J3489 Other specified disorders of nose and nasal sinuses: Secondary | ICD-10-CM | POA: Diagnosis not present

## 2023-09-29 DIAGNOSIS — R0981 Nasal congestion: Secondary | ICD-10-CM | POA: Diagnosis not present

## 2023-10-08 NOTE — Telephone Encounter (Signed)
Refer to patient message encounter from 09/23/23.

## 2023-10-08 NOTE — Telephone Encounter (Signed)
Left detailed message on pt's VM per DPR on file explaining Kristen Wilson's message about medicare payment plans to help with her deductible. Explained that if this wasn't something she wanted to do, we could look into Xarelto or Coumadin as well but the best option would be to call and talk to her insurance company on what is covered with her plan. Pt told to call our office with any questions she may have.

## 2023-10-09 DIAGNOSIS — R0982 Postnasal drip: Secondary | ICD-10-CM | POA: Diagnosis not present

## 2023-10-09 DIAGNOSIS — J019 Acute sinusitis, unspecified: Secondary | ICD-10-CM | POA: Diagnosis not present

## 2023-10-09 DIAGNOSIS — R0981 Nasal congestion: Secondary | ICD-10-CM | POA: Diagnosis not present

## 2023-10-09 DIAGNOSIS — R051 Acute cough: Secondary | ICD-10-CM | POA: Diagnosis not present

## 2023-11-18 ENCOUNTER — Other Ambulatory Visit: Payer: Medicare Other

## 2023-11-22 ENCOUNTER — Ambulatory Visit: Payer: Medicare Other | Attending: Internal Medicine | Admitting: Cardiology

## 2023-12-03 ENCOUNTER — Other Ambulatory Visit (HOSPITAL_COMMUNITY): Payer: Self-pay

## 2024-01-15 ENCOUNTER — Encounter (HOSPITAL_COMMUNITY): Payer: Self-pay | Admitting: Internal Medicine

## 2024-01-15 ENCOUNTER — Observation Stay (HOSPITAL_COMMUNITY)
Admission: EM | Admit: 2024-01-15 | Discharge: 2024-01-18 | Disposition: A | Discharge status: Home / Self Care | Attending: Internal Medicine | Admitting: Internal Medicine

## 2024-01-15 ENCOUNTER — Other Ambulatory Visit: Payer: Self-pay

## 2024-01-15 DIAGNOSIS — F419 Anxiety disorder, unspecified: Secondary | ICD-10-CM | POA: Diagnosis not present

## 2024-01-15 DIAGNOSIS — Z87891 Personal history of nicotine dependence: Secondary | ICD-10-CM | POA: Diagnosis not present

## 2024-01-15 DIAGNOSIS — Z79899 Other long term (current) drug therapy: Secondary | ICD-10-CM | POA: Insufficient documentation

## 2024-01-15 DIAGNOSIS — A045 Campylobacter enteritis: Principal | ICD-10-CM | POA: Insufficient documentation

## 2024-01-15 DIAGNOSIS — R58 Hemorrhage, not elsewhere classified: Secondary | ICD-10-CM | POA: Diagnosis not present

## 2024-01-15 DIAGNOSIS — I480 Paroxysmal atrial fibrillation: Secondary | ICD-10-CM | POA: Diagnosis present

## 2024-01-15 DIAGNOSIS — E876 Hypokalemia: Secondary | ICD-10-CM | POA: Diagnosis not present

## 2024-01-15 DIAGNOSIS — Z91040 Latex allergy status: Secondary | ICD-10-CM | POA: Insufficient documentation

## 2024-01-15 DIAGNOSIS — I10 Essential (primary) hypertension: Secondary | ICD-10-CM | POA: Diagnosis not present

## 2024-01-15 DIAGNOSIS — R2689 Other abnormalities of gait and mobility: Secondary | ICD-10-CM | POA: Diagnosis not present

## 2024-01-15 DIAGNOSIS — R509 Fever, unspecified: Secondary | ICD-10-CM | POA: Diagnosis not present

## 2024-01-15 DIAGNOSIS — R9431 Abnormal electrocardiogram [ECG] [EKG]: Secondary | ICD-10-CM | POA: Diagnosis not present

## 2024-01-15 DIAGNOSIS — E785 Hyperlipidemia, unspecified: Secondary | ICD-10-CM | POA: Insufficient documentation

## 2024-01-15 DIAGNOSIS — Z743 Need for continuous supervision: Secondary | ICD-10-CM | POA: Diagnosis not present

## 2024-01-15 DIAGNOSIS — R197 Diarrhea, unspecified: Secondary | ICD-10-CM | POA: Insufficient documentation

## 2024-01-15 DIAGNOSIS — F418 Other specified anxiety disorders: Secondary | ICD-10-CM | POA: Diagnosis present

## 2024-01-15 DIAGNOSIS — R6889 Other general symptoms and signs: Secondary | ICD-10-CM | POA: Diagnosis not present

## 2024-01-15 DIAGNOSIS — F32A Depression, unspecified: Secondary | ICD-10-CM | POA: Diagnosis not present

## 2024-01-15 DIAGNOSIS — F410 Panic disorder [episodic paroxysmal anxiety] without agoraphobia: Secondary | ICD-10-CM | POA: Insufficient documentation

## 2024-01-15 LAB — CBC WITH DIFFERENTIAL/PLATELET
Abs Immature Granulocytes: 0.02 10*3/uL (ref 0.00–0.07)
Basophils Absolute: 0 10*3/uL (ref 0.0–0.1)
Basophils Relative: 1 %
Eosinophils Absolute: 0.2 10*3/uL (ref 0.0–0.5)
Eosinophils Relative: 4 %
HCT: 38.3 % (ref 36.0–46.0)
Hemoglobin: 12.1 g/dL (ref 12.0–15.0)
Immature Granulocytes: 0 %
Lymphocytes Relative: 32 %
Lymphs Abs: 1.9 10*3/uL (ref 0.7–4.0)
MCH: 28 pg (ref 26.0–34.0)
MCHC: 31.6 g/dL (ref 30.0–36.0)
MCV: 88.7 fL (ref 80.0–100.0)
Monocytes Absolute: 0.6 10*3/uL (ref 0.1–1.0)
Monocytes Relative: 10 %
Neutro Abs: 3.2 10*3/uL (ref 1.7–7.7)
Neutrophils Relative %: 53 %
Platelets: 177 10*3/uL (ref 150–400)
RBC: 4.32 MIL/uL (ref 3.87–5.11)
RDW: 14.5 % (ref 11.5–15.5)
WBC: 6 10*3/uL (ref 4.0–10.5)
nRBC: 0 % (ref 0.0–0.2)

## 2024-01-15 LAB — POTASSIUM: Potassium: 2.4 mmol/L — CL (ref 3.5–5.1)

## 2024-01-15 LAB — BASIC METABOLIC PANEL WITH GFR
Anion gap: 10 (ref 5–15)
BUN: 11 mg/dL (ref 8–23)
CO2: 35 mmol/L — ABNORMAL HIGH (ref 22–32)
Calcium: 8.2 mg/dL — ABNORMAL LOW (ref 8.9–10.3)
Chloride: 95 mmol/L — ABNORMAL LOW (ref 98–111)
Creatinine, Ser: 0.91 mg/dL (ref 0.44–1.00)
GFR, Estimated: >60 mL/min (ref >60)
Glucose, Bld: 111 mg/dL — ABNORMAL HIGH (ref 70–99)
Potassium: 2.5 mmol/L — CL (ref 3.5–5.1)
Sodium: 140 mmol/L (ref 135–145)

## 2024-01-15 LAB — MAGNESIUM
Magnesium: 1.1 mg/dL — ABNORMAL LOW (ref 1.7–2.4)
Magnesium: 1.6 mg/dL — ABNORMAL LOW (ref 1.7–2.4)

## 2024-01-15 MED ORDER — ATORVASTATIN CALCIUM 20 MG PO TABS
20.0000 mg | ORAL_TABLET | Freq: Every day | ORAL | Status: DC
  Administered 2024-01-15 – 2024-01-17 (×3): 20 mg via ORAL
  Filled 2024-01-15 (×3): qty 1

## 2024-01-15 MED ORDER — POTASSIUM CHLORIDE CRYS ER 20 MEQ PO TBCR
40.0000 meq | EXTENDED_RELEASE_TABLET | Freq: Once | ORAL | Status: AC
  Administered 2024-01-15: 40 meq via ORAL
  Filled 2024-01-15: qty 2

## 2024-01-15 MED ORDER — PANTOPRAZOLE SODIUM 40 MG PO TBEC
40.0000 mg | DELAYED_RELEASE_TABLET | Freq: Every day | ORAL | Status: DC
  Administered 2024-01-16 – 2024-01-18 (×3): 40 mg via ORAL
  Filled 2024-01-15 (×3): qty 1

## 2024-01-15 MED ORDER — PROCHLORPERAZINE EDISYLATE 10 MG/2ML IJ SOLN: 10.0000 mg | Freq: Four times a day (QID) | INTRAMUSCULAR | Status: DC | PRN

## 2024-01-15 MED ORDER — METOPROLOL TARTRATE 50 MG PO TABS
50.0000 mg | ORAL_TABLET | Freq: Two times a day (BID) | ORAL | Status: DC
  Administered 2024-01-15 – 2024-01-18 (×6): 50 mg via ORAL
  Filled 2024-01-15 (×6): qty 1

## 2024-01-15 MED ORDER — APIXABAN 5 MG PO TABS
5.0000 mg | ORAL_TABLET | Freq: Two times a day (BID) | ORAL | Status: DC
  Administered 2024-01-15 – 2024-01-16 (×3): 5 mg via ORAL
  Filled 2024-01-15 (×3): qty 1

## 2024-01-15 MED ORDER — IRBESARTAN 300 MG PO TABS
300.0000 mg | ORAL_TABLET | Freq: Every day | ORAL | Status: DC
  Administered 2024-01-16 – 2024-01-18 (×3): 300 mg via ORAL
  Filled 2024-01-15 (×3): qty 1

## 2024-01-15 MED ORDER — POTASSIUM CHLORIDE 10 MEQ/100ML IV SOLN
10.0000 meq | INTRAVENOUS | Status: AC
  Administered 2024-01-15 (×3): 10 meq via INTRAVENOUS
  Filled 2024-01-15 (×3): qty 100

## 2024-01-15 MED ORDER — SODIUM CHLORIDE 0.9% FLUSH
3.0000 mL | Freq: Two times a day (BID) | INTRAVENOUS | Status: DC
  Administered 2024-01-15 – 2024-01-18 (×6): 3 mL via INTRAVENOUS

## 2024-01-15 MED ORDER — ACETAMINOPHEN 325 MG PO TABS
650.0000 mg | ORAL_TABLET | Freq: Four times a day (QID) | ORAL | Status: DC | PRN
  Administered 2024-01-16: 650 mg via ORAL
  Filled 2024-01-15: qty 2

## 2024-01-15 MED ORDER — ACETAMINOPHEN 650 MG RE SUPP: 650.0000 mg | Freq: Four times a day (QID) | RECTAL | Status: DC | PRN

## 2024-01-15 MED ORDER — MAGNESIUM SULFATE 2 GM/50ML IV SOLN
2.0000 g | Freq: Once | INTRAVENOUS | Status: AC
  Administered 2024-01-15: 2 g via INTRAVENOUS
  Filled 2024-01-15: qty 50

## 2024-01-15 MED ORDER — ALPRAZOLAM 0.5 MG PO TABS
0.5000 mg | ORAL_TABLET | Freq: Three times a day (TID) | ORAL | Status: DC
Start: 2024-01-15 — End: 2024-01-18
  Administered 2024-01-15 – 2024-01-18 (×8): 0.5 mg via ORAL
  Filled 2024-01-15 (×8): qty 1

## 2024-01-15 MED ORDER — DULOXETINE HCL 60 MG PO CPEP
60.0000 mg | ORAL_CAPSULE | Freq: Every day | ORAL | Status: DC
Start: 2024-01-16 — End: 2024-01-18
  Administered 2024-01-16 – 2024-01-18 (×3): 60 mg via ORAL
  Filled 2024-01-15 (×3): qty 1

## 2024-01-15 NOTE — ED Provider Notes (Signed)
 I provided a substantive portion of the care of this patient.  I personally made/approved the management plan for this patient and take responsibility for the patient management.  EKG Interpretation Date/Time:  Wednesday Jan 15 2024 16:29:55 EDT Ventricular Rate:  63 PR Interval:  144 QRS Duration:  133 QT Interval:  525 QTC Calculation: 538 R Axis:   37  Text Interpretation: Sinus rhythm Nonspecific intraventricular conduction delay Nonspecific T abnrm, anterolateral leads Confirmed by Lind Repine ([id number]) on 01/15/2024 6:39:46 PM   Patient here with hypokalemia as well as hypomagnesemia.  EKG shows normal sinus rhythm but has increased QT interval.  Patient given potassium as well as magnesium  and required mission   Lind Repine, MD 01/15/24 1918

## 2024-01-15 NOTE — H&P (Signed)
 History and Physical    Kristen Wilson WUJ:[id number] DOB: 1951/08/17 DOA: 01/15/2024  PCP: Helyn Lobstein, MD  Patient coming from: Home  I have personally briefly reviewed patient's old medical records in [facility]  Chief Complaint: Abnormal labs  HPI: Kristen Wilson is a 73 y.o. female with medical history significant for PAF on Eliquis , HTN, HLD, diverticulosis, depression/anxiety who presented to the ED for evaluation of abnormal labs.  Patient reports that she has been having diarrhea several times a day for the last few days.  She is also seen small amount of spotting of blood when wiping which is otherwise not unusual for her.  She has not had any nausea, vomiting, abdominal pain, chest pain, dyspnea, lower extremity swelling, muscle cramping.  She is seen by her PCP who obtained labs and called her with results of potassium 2.5 and told her to come to the ED for further evaluation.  Patient denies any changes in her home medications.  ED Course  Labs/Imaging on admission: I have personally reviewed following labs and imaging studies.  Initial vitals showed BP 126/67, pulse 63, RR 18, temp 98.9 F, SpO2 94% on room air.  Labs show WBC 6.0, hemoglobin 12.1, platelets 177, sodium 140, potassium 2.5, magnesium  1.1, bicarb 35, BUN 11, creatinine 0.91, serum glucose 111.  Patient was given IV magnesium  2 g, oral K 40 mEq and IV K 10 mEq x 3.  The hospitalist service was consulted to admit.  Review of Systems: All systems reviewed and are negative except as documented in history of present illness above.   Past Medical History:  Diagnosis Date   Allergy    Anginal pain (HCC)    hx- non recent - r/t anxiety/panic attack   Anxiety    Arthritis    "Whole body"   Bronchitis    uses inhaler prn   Cough    smoker s cough   Depression    Diverticulitis    GERD (gastroesophageal reflux disease)    Hiatal hernia    Hyperlipidemia    Hypertension    IBS (irritable  bowel syndrome)    Osteoporosis    Paroxysmal A-fib (HCC)    Pneumonia    hx   Primary localized osteoarthritis of left knee    Shortness of breath    occ   Smoker    SVD (spontaneous vaginal delivery)    x 2   Upper respiratory tract infection 06/14/2015   Starting IV Rocephin  today also starting nebulizers for wheezing    Past Surgical History:  Procedure Laterality Date   CHOLECYSTECTOMY     COLONOSCOPY N/A 03/20/2013   Procedure: COLONOSCOPY;  Surgeon: Celedonio Coil, MD;  Location: [facility] ENDOSCOPY;  Service: Endoscopy;  Laterality: N/A;   COLONOSCOPY WITH PROPOFOL  N/A 08/17/2023   Procedure: COLONOSCOPY WITH PROPOFOL ;  Surgeon: Evangeline Hilts, MD;  Location: [facility] ENDOSCOPY;  Service: Gastroenterology;  Laterality: N/A;   CYST EXCISION PERINEAL  03/20/2018   Procedure: INCISION AND DRAINAGE OF PERINEAL CYST;  Surgeon: Meriam Stamp, MD;  Location: [facility] ORS;  Service: Gynecology;;   DILATION AND CURETTAGE OF UTERUS     KNEE ARTHROSCOPY     left   LEFT HEART CATH AND CORONARY ANGIOGRAPHY N/A 02/14/2022   Procedure: LEFT HEART CATH AND CORONARY ANGIOGRAPHY;  Surgeon: Knox Perl, MD;  Location: [facility] INVASIVE CV LAB;  Service: Cardiovascular;  Laterality: N/A;   TONSILLECTOMY     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   Procedure: LEFT  TOTAL KNEE ARTHROPLASTY;  Surgeon: Elly Habermann, MD;  Location: [facility] OR;  Service: Orthopedics;  Laterality: Left;   TUBAL LIGATION     UPPER GI ENDOSCOPY     reflux, hiatal hernia   VULVECTOMY N/A 03/20/2018   Procedure: WIDE EXCISION VULVECTOMY;  Surgeon: Meriam Stamp, MD;  Location: [facility] ORS;  Service: Gynecology;  Laterality: N/A;   WISDOM TOOTH EXTRACTION      Social History: Social History   Tobacco Use   Smoking status: Former    Current packs/day: 0.00    Average packs/day: 1.5 packs/day for 40.0 years (60.0 ttl pk-yrs)    Types: Cigarettes    Start date: 02/08/1982    Quit date: 02/08/2022    Years since quitting: 1.9   Smokeless tobacco:  Never  Vaping Use   Vaping status: Every Day  Substance Use Topics   Alcohol use: No   Drug use: No   Allergies  Allergen Reactions   Latex Hives   Nsaids Other (See Comments) and Hypertension    Aleve / Ibuprofen  - causes Blood Pressure to rise   Percocet [Oxycodone -Acetaminophen ] Nausea And Vomiting    Family History  Problem Relation Age of Onset   Cancer Mother        breast   Arthritis Mother    Diabetes Mother    Cancer Father        bladder,multiple myeloma   Anxiety disorder Sister    Other Sister    Other Sister    Anxiety disorder Sister      Prior to Admission medications   Medication Sig Start Date End Date Taking? Authorizing Provider  ALPRAZolam  (XANAX ) 0.5 MG tablet Take 0.5 mg by mouth in the morning, at noon, and at bedtime. 12/12/21  Yes [provider]  apixaban  (ELIQUIS ) 5 MG TABS tablet Take 1 tablet (5 mg total) by mouth 2 (two) times daily. Please hold  Eliquis  7 more days Patient taking differently: Take 5 mg by mouth 2 (two) times daily. 08/24/23  Yes Lesa Rape, MD  atorvastatin  (LIPITOR ) 20 MG tablet Take 1 tablet (20 mg total) by mouth daily. Patient taking differently: Take 20 mg by mouth at bedtime. 06/03/23 01/15/24 Yes Tolia, Sunit, DO  dicyclomine  (BENTYL ) 20 MG tablet Take 20 mg by mouth 2 (two) times daily as needed for spasms. 07/09/23  Yes [provider]  DULoxetine  (CYMBALTA ) 60 MG capsule Take 60 mg by mouth in the morning. 01/08/22  Yes [provider]  hydrochlorothiazide  (HYDRODIURIL ) 25 MG tablet Take 25 mg by mouth daily.   Yes [provider]  irbesartan  (AVAPRO ) 300 MG tablet Take 300 mg by mouth daily. 11/18/21  Yes [provider]  metoprolol  tartrate (LOPRESSOR ) 50 MG tablet Take 1 tablet (50 mg total) by mouth 2 (two) times daily. 02/14/22  Yes Knox Perl, MD  pantoprazole  (PROTONIX ) 40 MG tablet Take 40 mg by mouth daily.     [provider]  Vilazodone  HCl (VIIBRYD ) 10 MG  TABS Take 10 mg by mouth daily. 07/18/23   [provider]    Physical Exam: Vitals:   01/15/24 1609 01/15/24 1900  BP: 126/67 105/67  Pulse: 64 (!) 59  Resp: 18 17  Temp: 98.9 F (37.2 C)   TempSrc: Oral   SpO2: 94% 98%   Constitutional: NAD, calm, comfortable Eyes: EOMI, lids and conjunctivae normal ENMT: Mucous membranes are moist. Posterior pharynx clear of any exudate or lesions.Normal dentition.  Neck: normal, supple, no masses. Respiratory:  clear to auscultation bilaterally, no wheezing, no crackles. Normal respiratory effort. No accessory muscle use.  Cardiovascular: Regular rate and rhythm, no murmurs / rubs / gallops. No extremity edema. 2+ pedal pulses. Abdomen: no tenderness, no masses palpated. Musculoskeletal: no clubbing / cyanosis. No joint deformity upper and lower extremities. Good ROM, no contractures. Normal muscle tone.  Skin: no rashes, lesions, ulcers. No induration Neurologic: Sensation intact. Strength 5/5 in all 4.  Psychiatric: Normal judgment and insight. Alert and oriented x 3. Normal mood.   EKG: Personally reviewed. Sinus rhythm, rate 63, QTc 538, mild T wave flattening.  QTc is more prolonged when compared to previous.  Assessment/Plan Principal Problem:   Hypokalemia Active Problems:   Hypomagnesemia   Paroxysmal atrial fibrillation (HCC)   Hypertension   Hyperlipidemia   Depression with anxiety   Kristen Wilson is a 73 y.o. female with medical history significant for PAF on Eliquis , HTN, HLD, diverticulosis, depression/anxiety who is admitted with hypokalemia.  Assessment and Plan: Hypokalemia/hypomagnesemia: In setting of diarrhea and HCTZ use.  EKG with mild changes including prolonged QTc.  Repleting.  Repeat labs tonight and in a.m. and replete further as necessary.  Paroxysmal atrial fibrillation: Remains in sinus rhythm on admission with controlled rate.  Continue Lopressor  50 mg twice daily and Eliquis  5 mg twice  daily.  Hypertension: Continue Lopressor  and irbesartan .  Holding HCTZ.  Hyperlipidemia: Continue atorvastatin .  Depression/anxiety: Continue Cymbalta  and home Xanax  0.5 mg 3 times daily.   DVT prophylaxis: apixaban  (ELIQUIS ) tablet 5 mg Start: 01/15/24 2200 apixaban  (ELIQUIS ) tablet 5 mg   Code Status: Full code, confirmed with patient on admission Family Communication: Discussed with patient, she has discussed with family Disposition Plan: From home and likely discharge to home pending clinical progress Consults called: None Severity of Illness: The appropriate patient status for this patient is OBSERVATION. Observation status is judged to be reasonable and necessary in order to provide the required intensity of service to ensure the patient's safety. The patient's presenting symptoms, physical exam findings, and initial radiographic and laboratory data in the context of their medical condition is felt to place them at decreased risk for further clinical deterioration. Furthermore, it is anticipated that the patient will be medically stable for discharge from the hospital within 2 midnights of admission.   Edith Gores MD [facility]  If 7PM-7AM, please contact night-coverage [email or web address]  01/15/2024, 8:24 PM

## 2024-01-15 NOTE — ED Triage Notes (Signed)
 Pt arrived reporting she was sent by her MD for abnormal lab-potassium 2.5. Denies any cp,shob, cramping or any  other symptoms

## 2024-01-15 NOTE — Hospital Course (Addendum)
 Brief Narrative:   73 year old with history of P A-fib on Eliquis , HTN, HLD, diverticulosis, depression/anxiety comes to the ED for evaluation of abnormal labs.  Patient states she has been having diarrhea for the past several days prior to the hospital admission and went to go see her PCP.  Routine blood work showed severe hypokalemia, hypomagnesemia therefore referred to the hospital.  Eventually GI panel was positive for Campylobacter therefore received 3 days of azithromycin .  Today diarrhea has significantly improved along with electrolytes and she is feeling stronger.  Will discharge her in stable condition.  Assessment & Plan:  Principal Problem:   Hypokalemia Active Problems:   Hypomagnesemia   Paroxysmal atrial fibrillation (HCC)   Hypertension   Hyperlipidemia   Depression with anxiety   Hypokalemia/hypomagnesemia/hypocalcemia Likely in the setting of diarrhea and HCTZ use.  They are now improving.  Aggressive repletion  Diarrhea secondary to Campylobacter gastroenteritis - C. difficile negative, GI panel positive for Campylobacter.  3 days of azithromycin  -Had 1 episode of bright red blood per rectum yesterday.  Hemoglobin is stable, CTA abdomen pelvis is negative for any active bleeding.  Likely from diverticular/hemorrhoid bleed. otherwise hemodynamically stable.  Colonoscopy December 2024-hemorrhoids, diverticulosis   Paroxysmal atrial fibrillation: Sinus rhythm.  Lopressor .  Hold Eliquis  for 1 week   Hypertension: Continue Lopressor    Hyperlipidemia: Continue atorvastatin .   Depression/anxiety: Continue Cymbalta  and home Xanax  0.5 mg 3 times daily.  PT/OT-no follow-up  DVT prophylaxis: None due to bright red blood per rectum    Code Status: Full Code Family Communication:   Discharge    Subjective: Doing significantly well, tells me her diarrhea has significantly improved.  Examination:  General exam: Appears calm and comfortable  Respiratory system:  Clear to auscultation. Respiratory effort normal. Cardiovascular system: S1 & S2 heard, RRR. No JVD, murmurs, rubs, gallops or clicks. No pedal edema. Gastrointestinal system: Abdomen is nondistended, soft and nontender. No organomegaly or masses felt. Normal bowel sounds heard. Central nervous system: Alert and oriented. No focal neurological deficits. Extremities: Symmetric 5 x 5 power. Skin: No rashes, lesions or ulcers Psychiatry: Judgement and insight appear normal. Mood & affect appropriate.

## 2024-01-15 NOTE — ED Provider Notes (Signed)
 [place] EMERGENCY DEPARTMENT AT [facility] Provider Note   CSN: [id number] Arrival date & time: 01/15/24  1554     History Chief Complaint  Patient presents with   ABNORMAL LABS    Kristen Wilson is a 73 y.o. female patient with history of atrial fibrillation on Eliquis , hyperlipidemia, hypertension who presents to the emergency department today for further evaluation of abnormal labs.  Patient had routine blood work done this morning and she was notified that her potassium was low.  Patient is not having any cramping, chest pain, shortness of breath, generalized weakness.  She does endorse some diarrhea that she has been having intermittently over the last 5 or so days.  She does note that some of the diarrhea has been bloody when she wipes and today she did have some blood in the toilet.  She denies generalized weakness, syncope.  Patient has had this problem in the past with a negative colonoscopy per the patient. No fever, nausea, vomiting, or chills.   HPI     Home Medications Prior to Admission medications   Medication Sig Start Date End Date Taking? Authorizing Provider  ALPRAZolam  (XANAX ) 0.5 MG tablet Take 0.5 mg by mouth in the morning, at noon, and at bedtime. 12/12/21  Yes [provider]  apixaban  (ELIQUIS ) 5 MG TABS tablet Take 1 tablet (5 mg total) by mouth 2 (two) times daily. Please hold  Eliquis  7 more days Patient taking differently: Take 5 mg by mouth 2 (two) times daily. 08/24/23  Yes Lesa Rape, MD  atorvastatin  (LIPITOR ) 20 MG tablet Take 1 tablet (20 mg total) by mouth daily. Patient taking differently: Take 20 mg by mouth at bedtime. 06/03/23 01/15/24 Yes Tolia, Sunit, DO  dicyclomine  (BENTYL ) 20 MG tablet Take 20 mg by mouth 2 (two) times daily as needed for spasms. 07/09/23  Yes [provider]  DULoxetine  (CYMBALTA ) 60 MG capsule Take 60 mg by mouth in the morning. 01/08/22  Yes [provider]  hydrochlorothiazide   (HYDRODIURIL ) 25 MG tablet Take 25 mg by mouth daily.   Yes [provider]  irbesartan  (AVAPRO ) 300 MG tablet Take 300 mg by mouth daily. 11/18/21  Yes [provider]  metoprolol  tartrate (LOPRESSOR ) 50 MG tablet Take 1 tablet (50 mg total) by mouth 2 (two) times daily. 02/14/22  Yes Knox Perl, MD  TYLENOL  8 HOUR ARTHRITIS PAIN 650 MG CR tablet Take 1,300 mg by mouth in the morning, at noon, and at bedtime.   Yes [provider]  pantoprazole  (PROTONIX ) 40 MG tablet Take 40 mg by mouth daily before breakfast.   Yes [provider]      Allergies    Latex, Nsaids, and Percocet [oxycodone -acetaminophen ]    Review of Systems   Review of Systems  All other systems reviewed and are negative.   Physical Exam Updated Vital Signs BP 105/67   Pulse (!) 59   Temp 98.9 F (37.2 C) (Oral)   Resp 17   SpO2 98%  Physical Exam Vitals and nursing note reviewed.  Constitutional:      General: She is not in acute distress.    Appearance: Normal appearance.  HENT:     Head: Normocephalic and atraumatic.  Eyes:     General:        Right eye: No discharge.        Left eye: No discharge.  Cardiovascular:     Comments: Regular rate and rhythm.  S1/S2 are distinct without  any evidence of murmur, rubs, or gallops.  Radial pulses are 2+ bilaterally.  Dorsalis pedis pulses are 2+ bilaterally.  No evidence of pedal edema. Pulmonary:     Comments: Clear to auscultation bilaterally.  Normal effort.  No respiratory distress.  No evidence of wheezes, rales, or rhonchi heard throughout. Abdominal:     General: Abdomen is flat. Bowel sounds are normal. There is no distension.     Tenderness: There is no abdominal tenderness. There is no guarding or rebound.  Musculoskeletal:        General: Normal range of motion.     Cervical back: Neck supple.  Skin:    General: Skin is warm and dry.     Findings: No rash.  Neurological:     General: No focal deficit present.      Mental Status: She is alert.  Psychiatric:        Mood and Affect: Mood normal.        Behavior: Behavior normal.     ED Results / Procedures / Treatments   Labs (all labs ordered are listed, but only abnormal results are displayed) Labs Reviewed  BASIC METABOLIC PANEL WITH GFR - Abnormal; Notable for the following components:      Result Value   Potassium 2.5 (*)    Chloride 95 (*)    CO2 35 (*)    Glucose, Bld 111 (*)    Calcium  8.2 (*)    All other components within normal limits  MAGNESIUM  - Abnormal; Notable for the following components:   Magnesium  1.1 (*)    All other components within normal limits  CBC WITH DIFFERENTIAL/PLATELET  MAGNESIUM   POTASSIUM    EKG EKG Interpretation Date/Time:  Wednesday Jan 15 2024 16:29:55 EDT Ventricular Rate:  63 PR Interval:  144 QRS Duration:  133 QT Interval:  525 QTC Calculation: 538 R Axis:   37  Text Interpretation: Sinus rhythm Nonspecific intraventricular conduction delay Nonspecific T abnrm, anterolateral leads Confirmed by Lind Repine ([id number]) on 01/15/2024 6:39:46 PM  Radiology No results found.  Procedures .Critical Care  Performed by: Darletta Ehrich, PA-C Authorized by: Darletta Ehrich, PA-C   Critical care provider statement:    Critical care time (minutes):  35   Critical care time was exclusive of:  Separately billable procedures and treating other patients   Critical care was necessary to treat or prevent imminent or life-threatening deterioration of the following conditions:  Metabolic crisis   Critical care was time spent personally by me on the following activities:  Blood draw for specimens, development of treatment plan with patient or surrogate, discussions with consultants, ordering and performing treatments and interventions, ordering and review of laboratory studies, ordering and review of radiographic studies and re-evaluation of patient's condition     Medications Ordered in  ED Medications  potassium chloride  10 mEq in 100 mL IVPB (10 mEq Intravenous New Bag/Given 01/15/24 1822)  magnesium  sulfate IVPB 2 g 50 mL (0 g Intravenous Stopped 01/15/24 1941)  potassium chloride  SA (KLOR-CON  M) CR tablet 40 mEq (40 mEq Oral Given 01/15/24 1824)    ED Course/ Medical Decision Making/ A&P Clinical Course as of 01/15/24 1945  Wed Jan 15, 2024  1700 CBC with Differential Normal. [CF]  1943 Basic metabolic panel(!!) Significant hypokalemia. [CF]  1943 Magnesium (!) Significant hypomagnesemia. [CF]  1943 ED EKG Evidence of QT prolongation and flattened T waves with respect to her baseline. [CF]    Clinical Course User Index [CF] Angelyn Kennel  M, PA-C   {   Click here for ABCD2, HEART and other calculators  Medical Decision Making DEMECIA NORTHWAY is a 73 y.o. female patient presents to the emergency department today for further evaluation of abnormal labs.  Will repeat BMP, CBC, and magnesium  here.  Will plan to replete potassium here and magnesium  as needed.  Will also get EKG to look for any T wave abnormalities.  Patient is overall asymptomatic with normal vital signs.  Patient is significantly hypokalemic, hypomagnesemic, with EKG changes.  Will admit for electrolyte repletion.  Patient agreeable.  Patient stable for admission.  Amount and/or Complexity of Data Reviewed Labs: ordered. Decision-making details documented in ED Course. ECG/medicine tests:  Decision-making details documented in ED Course.  Risk Prescription drug management. Decision regarding hospitalization.    Final Clinical Impression(s) / ED Diagnoses Final diagnoses:  Hypokalemia  Electrocardiogram showing T wave abnormalities    Rx / DC Orders ED Discharge Orders     None         Olympia Bianchi 01/15/24 1945    Lind Repine, MD 01/16/24 1400

## 2024-01-15 NOTE — ED Provider Triage Note (Signed)
 Emergency Medicine Provider Triage Evaluation Note  Kristen Wilson , a 73 y.o. female  was evaluated in triage.  Pt complains of hypokalemia.  Patient had blood work done with her primary care this morning.  Was called and told to come to the ED due to potassium being 2.5.  Denies any chest pain, palpitations, shortness of breath.  Review of Systems  Positive: Hypokalemia Negative: Chest pain, shortness of breath, weakness  Physical Exam  BP 126/67 (BP Location: Left Arm)   Pulse 64   Temp 98.9 F (37.2 C) (Oral)   Resp 18   SpO2 94%  Gen:   Awake, no distress   Resp:  Normal effort  MSK:   Moves extremities without difficulty  Other:    Medical Decision Making  Medically screening exam initiated at 4:25 PM.  Appropriate orders placed.  Rakel Junio Prouty was informed that the remainder of the evaluation will be completed by another provider, this initial triage assessment does not replace that evaluation, and the importance of remaining in the ED until their evaluation is complete.   Sonnie Dusky, PA-C 01/15/24 1626

## 2024-01-16 ENCOUNTER — Observation Stay (HOSPITAL_COMMUNITY)

## 2024-01-16 ENCOUNTER — Encounter (HOSPITAL_COMMUNITY): Payer: Self-pay | Admitting: Internal Medicine

## 2024-01-16 DIAGNOSIS — R161 Splenomegaly, not elsewhere classified: Secondary | ICD-10-CM | POA: Diagnosis not present

## 2024-01-16 DIAGNOSIS — E876 Hypokalemia: Secondary | ICD-10-CM | POA: Diagnosis not present

## 2024-01-16 DIAGNOSIS — R918 Other nonspecific abnormal finding of lung field: Secondary | ICD-10-CM | POA: Diagnosis not present

## 2024-01-16 DIAGNOSIS — K5730 Diverticulosis of large intestine without perforation or abscess without bleeding: Secondary | ICD-10-CM | POA: Diagnosis not present

## 2024-01-16 DIAGNOSIS — K559 Vascular disorder of intestine, unspecified: Secondary | ICD-10-CM | POA: Diagnosis not present

## 2024-01-16 LAB — CBC
HCT: 34.7 % — ABNORMAL LOW (ref 36.0–46.0)
Hemoglobin: 10.7 g/dL — ABNORMAL LOW (ref 12.0–15.0)
MCH: 27.4 pg (ref 26.0–34.0)
MCHC: 30.8 g/dL (ref 30.0–36.0)
MCV: 89 fL (ref 80.0–100.0)
Platelets: 143 10*3/uL — ABNORMAL LOW (ref 150–400)
RBC: 3.9 MIL/uL (ref 3.87–5.11)
RDW: 14.6 % (ref 11.5–15.5)
WBC: 4.6 10*3/uL (ref 4.0–10.5)
nRBC: 0 % (ref 0.0–0.2)

## 2024-01-16 LAB — HEMOGLOBIN AND HEMATOCRIT, BLOOD
HCT: 34.7 % — ABNORMAL LOW (ref 36.0–46.0)
Hemoglobin: 10.9 g/dL — ABNORMAL LOW (ref 12.0–15.0)

## 2024-01-16 LAB — BASIC METABOLIC PANEL WITH GFR
Anion gap: 6 (ref 5–15)
BUN: 7 mg/dL — ABNORMAL LOW (ref 8–23)
CO2: 27 mmol/L (ref 22–32)
Calcium: 7.7 mg/dL — ABNORMAL LOW (ref 8.9–10.3)
Chloride: 106 mmol/L (ref 98–111)
Creatinine, Ser: 0.72 mg/dL (ref 0.44–1.00)
GFR, Estimated: >60 mL/min (ref >60)
Glucose, Bld: 112 mg/dL — ABNORMAL HIGH (ref 70–99)
Potassium: 3.6 mmol/L (ref 3.5–5.1)
Sodium: 139 mmol/L (ref 135–145)

## 2024-01-16 LAB — C DIFFICILE QUICK SCREEN W PCR REFLEX
C Diff antigen: NEGATIVE
C Diff interpretation: NOT DETECTED
C Diff toxin: NEGATIVE

## 2024-01-16 LAB — MAGNESIUM: Magnesium: 2.3 mg/dL (ref 1.7–2.4)

## 2024-01-16 LAB — PHOSPHORUS: Phosphorus: 2.2 mg/dL — ABNORMAL LOW (ref 2.5–4.6)

## 2024-01-16 MED ORDER — IOHEXOL 350 MG/ML SOLN
100.0000 mL | Freq: Once | INTRAVENOUS | Status: AC | PRN
  Administered 2024-01-16: 100 mL via INTRAVENOUS

## 2024-01-16 MED ORDER — IPRATROPIUM-ALBUTEROL 0.5-2.5 (3) MG/3ML IN SOLN: 3.0000 mL | RESPIRATORY_TRACT | Status: DC | PRN

## 2024-01-16 MED ORDER — MAGNESIUM SULFATE 2 GM/50ML IV SOLN
2.0000 g | Freq: Once | INTRAVENOUS | Status: AC
Start: 2024-01-16 — End: 2024-01-16
  Administered 2024-01-16: 2 g via INTRAVENOUS
  Filled 2024-01-16: qty 50

## 2024-01-16 MED ORDER — POTASSIUM CHLORIDE 10 MEQ/100ML IV SOLN
10.0000 meq | INTRAVENOUS | Status: AC
  Administered 2024-01-16 (×6): 10 meq via INTRAVENOUS
  Filled 2024-01-16 (×6): qty 100

## 2024-01-16 MED ORDER — METOPROLOL TARTRATE 5 MG/5ML IV SOLN: 5.0000 mg | INTRAVENOUS | Status: DC | PRN

## 2024-01-16 MED ORDER — GLUCAGON HCL RDNA (DIAGNOSTIC) 1 MG IJ SOLR: 1.0000 mg | INTRAMUSCULAR | Status: DC | PRN

## 2024-01-16 MED ORDER — HYDRALAZINE HCL 20 MG/ML IJ SOLN: 10.0000 mg | INTRAMUSCULAR | Status: DC | PRN

## 2024-01-16 MED ORDER — TRAZODONE HCL 50 MG PO TABS: 50.0000 mg | ORAL_TABLET | Freq: Every evening | ORAL | Status: DC | PRN

## 2024-01-16 MED ORDER — GUAIFENESIN 100 MG/5ML PO LIQD: 5.0000 mL | ORAL | Status: DC | PRN

## 2024-01-16 MED ORDER — POTASSIUM PHOSPHATES 15 MMOLE/5ML IV SOLN
30.0000 mmol | Freq: Once | INTRAVENOUS | Status: AC
  Administered 2024-01-16: 30 mmol via INTRAVENOUS
  Filled 2024-01-16: qty 10

## 2024-01-16 MED ORDER — POTASSIUM CHLORIDE 20 MEQ PO PACK
60.0000 meq | PACK | Freq: Once | ORAL | Status: AC
  Administered 2024-01-16: 60 meq via ORAL
  Filled 2024-01-16: qty 3

## 2024-01-16 MED ORDER — SENNOSIDES-DOCUSATE SODIUM 8.6-50 MG PO TABS: 1.0000 | ORAL_TABLET | Freq: Every evening | ORAL | Status: DC | PRN

## 2024-01-16 NOTE — Progress Notes (Signed)
   01/16/24 1000  Spiritual Encounters  Type of Visit Initial  Care provided to: Patient  Reason for visit Advance directives  OnCall Visit No  Spiritual Framework  Presenting Themes Values and beliefs;Meaning/purpose/sources of inspiration;Community and relationships;Significant life change  Values/beliefs Christianity  Community/Connection Family  Interventions  Spiritual Care Interventions Made Established relationship of care and support;Compassionate presence;Reflective listening;Narrative/life review;Explored values/beliefs/practices/strengths;Prayer;Encouragement  Intervention Outcomes  Outcomes Connection to spiritual care;Connection to values and goals of care;Awareness around self/spiritual resourses;Awareness of support;Reduced isolation  Advance Directives (For Healthcare)  Does Patient Have a Medical Advance Directive? No  Would patient like information on creating a medical advance directive? Yes (Inpatient - patient defers creating a medical advance directive at this time - Information given)    Chaplain responded to spiritual consult request for Advance Directive. Chaplain provided paperwork and information. At this time, pt plans to list her sister (with whom she lives) as HCPOA. She will likely complete AD once discharged. Chaplain also provided spiritual care as indicated above, and remains available.

## 2024-01-16 NOTE — Progress Notes (Signed)
 PROGRESS NOTE    Kristen Wilson  ZOX:[id number] DOB: 11-29-1950 DOA: 01/15/2024 PCP: Helyn Lobstein, MD    Brief Narrative:   73 year old with history of P A-fib on Eliquis , HTN, HLD, diverticulosis, depression/anxiety comes to the ED for evaluation of abnormal labs.  Patient states she has been having diarrhea for the past several days prior to the hospital admission and went to go see her PCP.  Routine blood work showed severe hypokalemia, hypomagnesemia therefore referred to the hospital.  Assessment & Plan:  Principal Problem:   Hypokalemia Active Problems:   Hypomagnesemia   Paroxysmal atrial fibrillation (HCC)   Hypertension   Hyperlipidemia   Depression with anxiety   Hypokalemia/hypomagnesemia: Likely in the setting of diarrhea and HCTZ use.  Aggressive repletion, check ionized calcium  and phosphorus as well  Diarrhea - If persist, check GI panel and C. difficile   Paroxysmal atrial fibrillation: Sinus rhythm.  Lopressor  and Eliquis    Hypertension: Continue pressor, IV Sartain.  Holding HCTZ IV as needed   Hyperlipidemia: Continue atorvastatin .   Depression/anxiety: Continue Cymbalta  and home Xanax  0.5 mg 3 times daily.  PT/OT  DVT prophylaxis: apixaban  (ELIQUIS ) tablet 5 mg     Code Status: Full Code Family Communication:   Continue hospital stay due to significant electrolyte normality and diarrhea    Subjective:  Patient still continues to have diarrhea, she has already had 3 episodes this morning.  Overall still feels weak  Examination:  General exam: Appears calm and comfortable  Respiratory system: Clear to auscultation. Respiratory effort normal. Cardiovascular system: S1 & S2 heard, RRR. No JVD, murmurs, rubs, gallops or clicks. No pedal edema. Gastrointestinal system: Abdomen is nondistended, soft and nontender. No organomegaly or masses felt. Normal bowel sounds heard. Central nervous system: Alert and oriented. No focal neurological  deficits. Extremities: Symmetric 5 x 5 power. Skin: No rashes, lesions or ulcers Psychiatry: Judgement and insight appear normal. Mood & affect appropriate.                Diet Orders (From admission, onward)     Start     Ordered   01/15/24 1932  Diet Heart Fluid consistency: Thin  Diet effective now       Question:  Fluid consistency:  Answer:  Thin   01/15/24 1931            Objective: Vitals:   01/16/24 0102 01/16/24 0543 01/16/24 0852 01/16/24 0900  BP: (!) 140/68 (!) 140/74 (!) 168/73   Pulse: 73 79 71   Resp: 16 18 18    Temp: 98.4 F (36.9 C) 98.1 F (36.7 C) 98 F (36.7 C)   TempSrc: Oral Oral Oral   SpO2: 95% 97% 96%   Weight:    65.4 kg  Height:    5\' 1"  (1.549 m)    Intake/Output Summary (Last 24 hours) at 01/16/2024 1054 Last data filed at 01/16/2024 0839 Gross per 24 hour  Intake 708.28 ml  Output --  Net 708.28 ml   Filed Weights   01/16/24 0900  Weight: 65.4 kg    Scheduled Meds:  ALPRAZolam   0.5 mg Oral TID   apixaban   5 mg Oral BID   atorvastatin   20 mg Oral QHS   DULoxetine   60 mg Oral Daily   irbesartan   300 mg Oral Daily   metoprolol  tartrate  50 mg Oral BID   pantoprazole   40 mg Oral QAC breakfast   sodium chloride  flush  3 mL Intravenous Q12H   Continuous  Infusions:  Nutritional status     Body mass index is 27.23 kg/m.  Data Reviewed:   CBC: Recent Labs  Lab 01/15/24 1620  WBC 6.0  NEUTROABS 3.2  HGB 12.1  HCT 38.3  MCV 88.7  PLT 177   Basic Metabolic Panel: Recent Labs  Lab 01/15/24 1620 01/15/24 2244  NA 140  --   K 2.5* 2.4*  CL 95*  --   CO2 35*  --   GLUCOSE 111*  --   BUN 11  --   CREATININE 0.91  --   CALCIUM  8.2*  --   MG 1.1* 1.6*   GFR: Estimated Creatinine Clearance: 47.6 mL/min (by C-G formula based on SCr of 0.91 mg/dL). Liver Function Tests: No results for input(s): "AST", "ALT", "ALKPHOS", "BILITOT", "PROT", "ALBUMIN" in the last 168 hours. No results for input(s): "LIPASE",  "AMYLASE" in the last 168 hours. No results for input(s): "AMMONIA" in the last 168 hours. Coagulation Profile: No results for input(s): "INR", "PROTIME" in the last 168 hours. Cardiac Enzymes: No results for input(s): "CKTOTAL", "CKMB", "CKMBINDEX", "TROPONINI" in the last 168 hours. BNP (last 3 results) No results for input(s): "PROBNP" in the last 8760 hours. HbA1C: No results for input(s): "HGBA1C" in the last 72 hours. CBG: No results for input(s): "GLUCAP" in the last 168 hours. Lipid Profile: No results for input(s): "CHOL", "HDL", "LDLCALC", "TRIG", "CHOLHDL", "LDLDIRECT" in the last 72 hours. Thyroid  Function Tests: No results for input(s): "TSH", "T4TOTAL", "FREET4", "T3FREE", "THYROIDAB" in the last 72 hours. Anemia Panel: No results for input(s): "VITAMINB12", "FOLATE", "FERRITIN", "TIBC", "IRON", "RETICCTPCT" in the last 72 hours. Sepsis Labs: No results for input(s): "PROCALCITON", "LATICACIDVEN" in the last 168 hours.  No results found for this or any previous visit (from the past 240 hours).       Radiology Studies: No results found.         LOS: 0 days   Time spent= 35 mins    Maggie Schooner, MD [facility]  If 7PM-7AM, please contact night-coverage  01/16/2024, 10:54 AM

## 2024-01-16 NOTE — Progress Notes (Signed)
 Mobility Specialist - Progress Note   01/16/24 1140  Mobility  Activity Ambulated independently in hallway  Level of Assistance Independent  Assistive Device None  Distance Ambulated (ft) 500 ft  Activity Response Tolerated well  Mobility Referral Yes  Mobility visit 1 Mobility  Mobility Specialist Start Time (ACUTE ONLY) 1132  Mobility Specialist Stop Time (ACUTE ONLY) 1140  Mobility Specialist Time Calculation (min) (ACUTE ONLY) 8 min   Pt received in bed and agreeable to mobility. No complaints during session. Pt to bed after session with all needs met.    [facility]

## 2024-01-16 NOTE — Care Management Obs Status (Signed)
 MEDICARE OBSERVATION STATUS NOTIFICATION   Patient Details  Name: LEOTHA WESTERMEYER MRN: [id number] Date of Birth: May 05, 1951   Medicare Observation Status Notification Given:  Yes    Zenon Hilda, LCSW 01/16/2024, 3:48 PM

## 2024-01-16 NOTE — Evaluation (Signed)
 Occupational Therapy Evaluation Patient Details Name: Kristen Wilson MRN: [id number] DOB: [birth year] Today's Date: 01/16/2024   History of Present Illness   Kristen Wilson is a 73 y.o. female admitted with hypokalemia. PMH significant for PAF on Eliquis , HTN, HLD, diverticulosis, & depression/anxiety     Clinical Impressions Prior to hospital admission, pt was independent to mod independent with intermittent use of SPC for longer distances. Denies recent falls, drives and walks outdoors regularly. 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 Pt currently requires supervision for functional mobility and transfers in room, walks t/f bathroom without AD, no overt LOB noted while reaching outside BOS during ambulation. Pt reports feeling at her functional baseline, no further skilled OT needs identified. OT to complete orders and sign off, thank you for the referral.     If plan is discharge home, recommend the following:   A little help with walking and/or transfers;A little help with bathing/dressing/bathroom;Assist for transportation     Functional Status Assessment   Patient has not had a recent decline in their functional status     Equipment Recommendations   None recommended by OT      Precautions/Restrictions   Precautions Precautions: Fall Restrictions Weight Bearing Restrictions Per Provider Order: No     Mobility Bed Mobility Overal bed mobility: Modified Independent                  Transfers Overall transfer level: Needs assistance Equipment used: None Transfers: Sit to/from Stand, Bed to chair/wheelchair/BSC Sit to Stand: Supervision           General transfer comment: steady during STS and ambulation to recliner      Balance Overall balance assessment: Mild deficits observed, not formally tested                                         ADL either performed or assessed with clinical judgement    ADL Overall ADL's : Needs assistance/impaired Eating/Feeding: Independent;Sitting                       Toilet Transfer: Supervision/safety;Ambulation;Regular Teacher, adult education Details (indicate cue type and reason): clinical judgement Toileting- Clothing Manipulation and Hygiene: Supervision/safety       Functional mobility during ADLs: Supervision/safety General ADL Comments: 20 ft in room without AD, no overt LOB, supervision level, walked 500 ft in hallway earlier today     Vision Baseline Vision/History: 1 Wears glasses Ability to See in Adequate Light: 0 Adequate Patient Visual Report: No change from baseline              Pertinent Vitals/Pain Pain Assessment Pain Assessment: No/denies pain     Extremity/Trunk Assessment Upper Extremity Assessment Upper Extremity Assessment: Right hand dominant;Overall WFL for tasks assessed (5/5 MMT)   Lower Extremity Assessment Lower Extremity Assessment: Overall WFL for tasks assessed   Cervical / Trunk Assessment Cervical / Trunk Assessment: Normal   Communication Communication Communication: No apparent difficulties   Cognition Arousal: Alert Behavior During Therapy: WFL for tasks assessed/performed Cognition: No apparent impairments                               Following commands: Intact       Cueing  General Comments   Cueing Techniques: Verbal  cues   VSS (NT assessed in room), on RA           Home Living Family/patient expects to be discharged to:: Private residence Living Arrangements: Other relatives Available Help at Discharge: Family;Available 24 hours/day (lives with sister) Type of Home: House Home Access: Level entry     Home Layout: One level     Bathroom Shower/Tub: Tub/shower unit;Walk-in shower   Bathroom Toilet: Handicapped height Bathroom Accessibility: Yes   Home Equipment: Cane - single Information systems manager (2 wheels)          Prior  Functioning/Environment Prior Level of Function : Independent/Modified Independent;Driving             Mobility Comments: occassional use of SPC (denies recent falls, and cannot remember if she has had falls in pst 6 mo) ADLs Comments: independent, drives            OT Goals(Current goals can be found in the care plan section)   Acute Rehab OT Goals OT Goal Formulation: All assessment and education complete, DC therapy   AM-PAC OT "6 Clicks" Daily Activity     Outcome Measure Help from another person eating meals?: None Help from another person taking care of personal grooming?: None Help from another person toileting, which includes using toliet, bedpan, or urinal?: A Little Help from another person bathing (including washing, rinsing, drying)?: A Little Help from another person to put on and taking off regular upper body clothing?: None Help from another person to put on and taking off regular lower body clothing?: A Little 6 Click Score: 21   End of Session Nurse Communication: Mobility status;Other (comment) (recommended for supervision t/f bathroom due to hx of dizziness/vertigo spells per pt)  Activity Tolerance: Patient tolerated treatment well Patient left: in chair;with call bell/phone within reach;with nursing/sitter in room (NT in room to change chair alarm batteries)                   Time: 4259-5638 OT Time Calculation (min): 24 min Charges:  OT General Charges $OT Visit: 1 Visit OT Evaluation $OT Eval Low Complexity: 1 Low Fadil Macmaster L.  Payes, OTR/L  01/16/24, 12:53 PM

## 2024-01-16 NOTE — Progress Notes (Signed)
 Approx. 1650, patient called out asking for help. Upon assessment, noted bright blood present on floor, on toilet seat, and inside of toilet bowl. Patient is actively bleeding from her rectum. Writer notified attending MD of patient's change in status while another RN and NT assisted patient with hygiene. Patient was assisted back to bed. Vital signs were obtained and recorded. New orders were written by attending MD. Patient was left in bed in lowest position, call bell within reach, and fall alarm functional. Will continue to monitor.

## 2024-01-16 NOTE — Evaluation (Addendum)
 Physical Therapy Evaluation Patient Details Name: Kristen Wilson MRN: [id number] DOB: December 16, 1950 Today's Date: 01/16/2024  History of Present Illness  Kristen Wilson is a 73 y.o. female admitted with hypokalemia. PMH significant for PAF on Eliquis , HTN, HLD, diverticulosis, &depression/anxiety  Clinical Impression  Patient evaluated by Physical Therapy with no further acute PT needs identified. All education has been completed and the patient has no further questions.  Prior to hospital admission, pt was independent to mod independent with intermittent use of SPC for longer distances. Denies recent falls, drives and walks outdoors regularly. 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  Pt is mod I to independent for bed mobility, STS transfers from varied ht surfaces and able to walk short community distances without assist or device; mildly unsteady initially however stability improved with distance,  no LOB with higher level balance activities. HR max 77 with amb No f/u PT needs indicated.  Mobility team will continue to follow, encouraged pt to continue to mobilize.   See below for any follow-up Physical Therapy or equipment needs. PT is signing off. Thank you for this referral.         If plan is discharge home, recommend the following: Help with stairs or ramp for entrance   Can travel by private vehicle        Equipment Recommendations None recommended by PT  Recommendations for Other Services       Functional Status Assessment Patient has not had a recent decline in their functional status     Precautions / Restrictions Precautions Precautions: Fall Restrictions Weight Bearing Restrictions Per Provider Order: No      Mobility  Bed Mobility Overal bed mobility: Modified Independent                  Transfers Overall transfer level: Modified independent Equipment used: None Transfers: Sit to/from Stand Sit to Stand: Supervision,  Modified independent (Device/Increase time)           General transfer comment: steady STS from bed and toilet; no physicaL assist    Ambulation/Gait Ambulation/Gait assistance: Supervision, Modified independent (Device/Increase time) Gait Distance (Feet): 350 Feet Assistive device: None, IV Pole Gait Pattern/deviations: Step-through pattern, Wide base of support Gait velocity: decr but functional     General Gait Details: bil LE external rotation. guarded intially,  no overt LOB; velocity incr with distance  Stairs            Wheelchair Mobility     Tilt Bed    Modified Rankin (Stroke Patients Only)       Balance Overall balance assessment: Needs assistance (denies falls last 6 mos) Sitting-balance support: Feet supported, No upper extremity supported Sitting balance-Leahy Scale: Good     Standing balance support: No upper extremity supported, During functional activity Standing balance-Leahy Scale: Fair Standing balance comment: Fair+             High level balance activites: Side stepping, Direction changes, Head turns, Turns High Level Balance Comments: mildly unsteady however no LOB with above or min perturbations; uses cane intermittently at baseline             Pertinent Vitals/Pain Pain Assessment Pain Assessment: No/denies pain    Home Living Family/patient expects to be discharged to:: Private residence Living Arrangements: Other relatives Available Help at Discharge: Family;Available 24 hours/day Type of Home: House Home Access: Level entry       Home Layout: One level Home Equipment:  Cane - single Information systems manager (2 wheels)      Prior Function Prior Level of Function : Independent/Modified Independent;Driving             Mobility Comments: occassional use of SPC (denies recent falls, and cannot remember if she has had falls in past 6 mo) ADLs Comments: independent, drives     Extremity/Trunk Assessment    Upper Extremity Assessment Upper Extremity Assessment: Defer to OT evaluation;Overall [facility] for tasks assessed    Lower Extremity Assessment Lower Extremity Assessment: Overall WFL for tasks assessed    Cervical / Trunk Assessment Cervical / Trunk Assessment: Normal  Communication        Cognition Arousal: Alert Behavior During Therapy: WFL for tasks assessed/performed   PT - Cognitive impairments: No apparent impairments                         Following commands: Intact       Cueing Cueing Techniques: Verbal cues     General Comments      Exercises     Assessment/Plan    PT Assessment Patient does not need any further PT services  PT Problem List         PT Treatment Interventions      PT Goals (Current goals can be found in the Care Plan section)  Acute Rehab PT Goals PT Goal Formulation: All assessment and education complete, DC therapy    Frequency       Co-evaluation               AM-PAC PT "6 Clicks" Mobility  Outcome Measure Help needed turning from your back to your side while in a flat bed without using bedrails?: None Help needed moving from lying on your back to sitting on the side of a flat bed without using bedrails?: None Help needed moving to and from a bed to a chair (including a wheelchair)?: None Help needed standing up from a chair using your arms (e.g., wheelchair or bedside chair)?: None Help needed to walk in hospital room?: None Help needed climbing 3-5 steps with a railing? : A Little 6 Click Score: 23    End of Session   Activity Tolerance: Patient tolerated treatment well Patient left: with call bell/phone within reach;in bed;with bed alarm set   PT Visit Diagnosis: Other abnormalities of gait and mobility (R26.89)    Time: 1610-9604 PT Time Calculation (min) (ACUTE ONLY): 17 min   Charges:   PT Evaluation $PT Eval Low Complexity: 1 Low   PT General Charges $$ ACUTE PT VISIT: 1 Visit          Keontay Vora, PT  Acute Rehab Dept ([facility]) [phone]  01/16/2024   [facility] 01/16/2024, 4:07 PM

## 2024-01-16 NOTE — Plan of Care (Signed)

## 2024-01-16 NOTE — TOC Initial Note (Signed)
 Transition of Care [facility]) - Initial/Assessment Note   Patient Details  Name: Kristen Wilson MRN: [id number] Date of Birth: 03-19-1951  Transition of Care [facility]) CM/SW Contact:    Zenon Hilda, LCSW Phone Number: 01/16/2024, 9:19 AM  Clinical Narrative: Patient is from home. PT/OT consulted. TOC awaiting recommendations.  Expected Discharge Plan:  (TBD) Barriers to Discharge: Continued Medical Work up  Expected Discharge Plan and Services In-house Referral: Clinical Social Work  Prior Living Arrangements/Services Patient language and need for interpreter reviewed:: Yes Do you feel safe going back to the place where you live?: Yes      Need for Family Participation in Patient Care: No (Comment) Care giver support system in place?: Yes (comment) Criminal Activity/Legal Involvement Pertinent to Current Situation/Hospitalization: No - Comment as needed  Activities of Daily Living ADL Screening (condition at time of admission) Independently performs ADLs?: Yes (appropriate for developmental age) Is the patient deaf or have difficulty hearing?: No Does the patient have difficulty seeing, even when wearing glasses/contacts?: No Does the patient have difficulty concentrating, remembering, or making decisions?: No  Emotional Assessment Orientation: : Oriented to Self, Oriented to Place, Oriented to  Time, Oriented to Situation Alcohol / Substance Use: Not Applicable Psych Involvement: No (comment)  Admission diagnosis:  Hypokalemia [E87.6] Electrocardiogram showing T wave abnormalities [R94.31] Patient Active Problem List   Diagnosis Date Noted   Hypomagnesemia 01/15/2024   Hypokalemia 01/15/2024   Rectal bleeding 08/15/2023   BRBPR (bright red blood per rectum) 08/12/2023   Elevated troponin 02/14/2022   Paroxysmal atrial fibrillation (HCC) 02/14/2022   Tobacco use disorder    New onset a-fib (HCC) 02/13/2022   Upper respiratory tract infection 06/14/2015   DJD (degenerative  joint disease) of knee 06/13/2015   Primary localized osteoarthritis of left knee    Hiatal hernia    Arthritis    Allergy    Depression with anxiety    GERD (gastroesophageal reflux disease)    Osteoporosis    Shortness of breath    IBS (irritable bowel syndrome)    Hypertension 11/02/2011   Chest pain 11/02/2011   Hyperlipidemia    PCP:  Helyn Lobstein, MD Pharmacy:   [facility] [phone] [place], [place] - [address] AT [facility] [address] [place] [address] Phone: [phone] Fax: [phone]  Social Drivers of Health (SDOH) Social History: SDOH Screenings   Food Insecurity: No Food Insecurity (01/15/2024)  Housing: Low Risk  (01/15/2024)  Transportation Needs: No Transportation Needs (01/15/2024)  Utilities: Not At Risk (01/15/2024)  Social Connections: Patient Declined (01/15/2024)  Tobacco Use: Medium Risk (01/16/2024)   SDOH Interventions:    Readmission Risk Interventions     No data to display

## 2024-01-17 DIAGNOSIS — E876 Hypokalemia: Secondary | ICD-10-CM | POA: Diagnosis not present

## 2024-01-17 LAB — GASTROINTESTINAL PANEL BY PCR, STOOL (REPLACES STOOL CULTURE)

## 2024-01-17 LAB — BASIC METABOLIC PANEL WITH GFR
Anion gap: 7 (ref 5–15)
BUN: 13 mg/dL (ref 8–23)
CO2: 30 mmol/L (ref 22–32)
Calcium: 8.5 mg/dL — ABNORMAL LOW (ref 8.9–10.3)
Chloride: 102 mmol/L (ref 98–111)
Creatinine, Ser: 0.74 mg/dL (ref 0.44–1.00)
GFR, Estimated: >60 mL/min (ref >60)
Glucose, Bld: 100 mg/dL — ABNORMAL HIGH (ref 70–99)
Potassium: 3 mmol/L — ABNORMAL LOW (ref 3.5–5.1)
Sodium: 139 mmol/L (ref 135–145)

## 2024-01-17 LAB — POTASSIUM: Potassium: 4.1 mmol/L (ref 3.5–5.1)

## 2024-01-17 LAB — PHOSPHORUS: Phosphorus: 4.6 mg/dL (ref 2.5–4.6)

## 2024-01-17 LAB — MAGNESIUM: Magnesium: 2 mg/dL (ref 1.7–2.4)

## 2024-01-17 MED ORDER — CALCIUM GLUCONATE-NACL 2-0.675 GM/100ML-% IV SOLN
2.0000 g | Freq: Once | INTRAVENOUS | Status: AC
  Administered 2024-01-17: 2000 mg via INTRAVENOUS
  Filled 2024-01-17: qty 100

## 2024-01-17 MED ORDER — MAGNESIUM OXIDE -MG SUPPLEMENT 400 (240 MG) MG PO TABS
800.0000 mg | ORAL_TABLET | Freq: Once | ORAL | Status: AC
  Administered 2024-01-17: 800 mg via ORAL
  Filled 2024-01-17: qty 2

## 2024-01-17 MED ORDER — AZITHROMYCIN 500 MG PO TABS
500.0000 mg | ORAL_TABLET | Freq: Every day | ORAL | Status: DC
  Administered 2024-01-17 – 2024-01-18 (×2): 500 mg via ORAL
  Filled 2024-01-17: qty 1
  Filled 2024-01-17: qty 2

## 2024-01-17 MED ORDER — POTASSIUM CHLORIDE CRYS ER 20 MEQ PO TBCR
40.0000 meq | EXTENDED_RELEASE_TABLET | Freq: Once | ORAL | Status: AC
  Administered 2024-01-17: 40 meq via ORAL
  Filled 2024-01-17: qty 2

## 2024-01-17 MED ORDER — POTASSIUM CHLORIDE 10 MEQ/100ML IV SOLN
10.0000 meq | INTRAVENOUS | Status: AC
Start: 2024-01-17 — End: 2024-01-17
  Administered 2024-01-17 (×4): 10 meq via INTRAVENOUS
  Filled 2024-01-17 (×4): qty 100

## 2024-01-17 NOTE — Progress Notes (Signed)
 PROGRESS NOTE    Kristen Wilson  [id number] DOB: February 24, 1951 DOA: 01/15/2024 PCP: Helyn Lobstein, MD    Brief Narrative:   73 year old with history of P A-fib on Eliquis , HTN, HLD, diverticulosis, depression/anxiety comes to the ED for evaluation of abnormal labs.  Patient states she has been having diarrhea for the past several days prior to the hospital admission and went to go see her PCP.  Routine blood work showed severe hypokalemia, hypomagnesemia therefore referred to the hospital.  Assessment & Plan:  Principal Problem:   Hypokalemia Active Problems:   Hypomagnesemia   Paroxysmal atrial fibrillation (HCC)   Hypertension   Hyperlipidemia   Depression with anxiety   Hypokalemia/hypomagnesemia/hypocalcemia Likely in the setting of diarrhea and HCTZ use.  Aggressive repletion  Diarrhea Bright red blood per rectum - C. difficile negative, GI panel pending -Had 1 episode of bright red blood per rectum yesterday.  Hemoglobin is stable, CTA abdomen pelvis is negative for any active bleeding.  Likely from diverticular/hemorrhoid bleed. otherwise hemodynamically stable. -Will hold Eliquis  for about 1 week  Colonoscopy December 2024-hemorrhoids, diverticulosis   Paroxysmal atrial fibrillation: Sinus rhythm.  Lopressor .  Hold Eliquis  for 1 week   Hypertension: Continue Lopressor .  IV as needed   Hyperlipidemia: Continue atorvastatin .   Depression/anxiety: Continue Cymbalta  and home Xanax  0.5 mg 3 times daily.  PT/OT-no follow-up  DVT prophylaxis: None due to bright red blood per rectum    Code Status: Full Code Family Communication:   Continue hospital stay due to significant electrolyte normality and diarrhea. Hopefully home tomorrow.     Subjective: Eating, feels a little stronger today  Examination:  General exam: Appears calm and comfortable  Respiratory system: Clear to auscultation. Respiratory effort normal. Cardiovascular system: S1 & S2 heard, RRR.  No JVD, murmurs, rubs, gallops or clicks. No pedal edema. Gastrointestinal system: Abdomen is nondistended, soft and nontender. No organomegaly or masses felt. Normal bowel sounds heard. Central nervous system: Alert and oriented. No focal neurological deficits. Extremities: Symmetric 5 x 5 power. Skin: No rashes, lesions or ulcers Psychiatry: Judgement and insight appear normal. Mood & affect appropriate.                Diet Orders (From admission, onward)     Start     Ordered   01/15/24 1932  Diet Heart Fluid consistency: Thin  Diet effective now       Question:  Fluid consistency:  Answer:  Thin   01/15/24 1931            Objective: Vitals:   01/16/24 1658 01/16/24 2002 01/16/24 2118 01/17/24 0453  BP: (!) 166/82 (!) 157/70 (!) 157/70 (!) 153/87  Pulse: 71 80 80 74  Resp: 18 20  20   Temp: 97.9 F (36.6 C) (!) 97.5 F (36.4 C)  98.4 F (36.9 C)  TempSrc: Oral Oral  Oral  SpO2: 100% 96%  96%  Weight:      Height:        Intake/Output Summary (Last 24 hours) at 01/17/2024 1106 Last data filed at 01/16/2024 1839 Gross per 24 hour  Intake 303.15 ml  Output --  Net 303.15 ml   Filed Weights   01/16/24 0900  Weight: 65.4 kg    Scheduled Meds:  ALPRAZolam   0.5 mg Oral TID   atorvastatin   20 mg Oral QHS   DULoxetine   60 mg Oral Daily   irbesartan   300 mg Oral Daily   metoprolol  tartrate  50 mg Oral BID  pantoprazole   40 mg Oral QAC breakfast   sodium chloride  flush  3 mL Intravenous Q12H   Continuous Infusions:  calcium  gluconate 2,000 mg (01/17/24 1022)   potassium chloride  10 mEq (01/17/24 1022)    Nutritional status     Body mass index is 27.23 kg/m.  Data Reviewed:   CBC: Recent Labs  Lab 01/15/24 1620 01/16/24 1107 01/16/24 1715  WBC 6.0 4.6  --   NEUTROABS 3.2  --   --   HGB 12.1 10.7* 10.9*  HCT 38.3 34.7* 34.7*  MCV 88.7 89.0  --   PLT 177 143*  --    Basic Metabolic Panel: Recent Labs  Lab 01/15/24 1620  01/15/24 2244 01/16/24 1107 01/17/24 0446  NA 140  --  139 139  K 2.5* 2.4* 3.6 3.0*  CL 95*  --  106 102  CO2 35*  --  27 30  GLUCOSE 111*  --  112* 100*  BUN 11  --  7* 13  CREATININE 0.91  --  0.72 0.74  CALCIUM  8.2*  --  7.7* 8.5*  MG 1.1* 1.6* 2.3 2.0  PHOS  --   --  2.2* 4.6   GFR: Estimated Creatinine Clearance: 54.2 mL/min (by C-G formula based on SCr of 0.74 mg/dL). Liver Function Tests: No results for input(s): "AST", "ALT", "ALKPHOS", "BILITOT", "PROT", "ALBUMIN" in the last 168 hours. No results for input(s): "LIPASE", "AMYLASE" in the last 168 hours. No results for input(s): "AMMONIA" in the last 168 hours. Coagulation Profile: No results for input(s): "INR", "PROTIME" in the last 168 hours. Cardiac Enzymes: No results for input(s): "CKTOTAL", "CKMB", "CKMBINDEX", "TROPONINI" in the last 168 hours. BNP (last 3 results) No results for input(s): "PROBNP" in the last 8760 hours. HbA1C: No results for input(s): "HGBA1C" in the last 72 hours. CBG: No results for input(s): "GLUCAP" in the last 168 hours. Lipid Profile: No results for input(s): "CHOL", "HDL", "LDLCALC", "TRIG", "CHOLHDL", "LDLDIRECT" in the last 72 hours. Thyroid  Function Tests: No results for input(s): "TSH", "T4TOTAL", "FREET4", "T3FREE", "THYROIDAB" in the last 72 hours. Anemia Panel: No results for input(s): "VITAMINB12", "FOLATE", "FERRITIN", "TIBC", "IRON", "RETICCTPCT" in the last 72 hours. Sepsis Labs: No results for input(s): "PROCALCITON", "LATICACIDVEN" in the last 168 hours.  Recent Results (from the past 240 hours)  C Difficile Quick Screen w PCR reflex     Status: None   Collection Time: 01/16/24 10:16 AM   Specimen: STOOL  Result Value Ref Range Status   C Diff antigen NEGATIVE NEGATIVE Final   C Diff toxin NEGATIVE NEGATIVE Final   C Diff interpretation No C. difficile detected.  Final    Comment: Performed at [facility], [address] [place], [place]  [address]         Radiology Studies: CT ANGIO GI BLEED Result Date: 01/16/2024 CLINICAL DATA:  Mesenteric ischemia and acute GI bleed EXAM: CTA ABDOMEN AND PELVIS WITHOUT AND WITH CONTRAST TECHNIQUE: Multidetector CT imaging of the abdomen and pelvis was performed using the standard protocol during bolus administration of intravenous contrast. Multiplanar reconstructed images and MIPs were obtained and reviewed to evaluate the vascular anatomy. RADIATION DOSE REDUCTION: This exam was performed according to the departmental dose-optimization program which includes automated exposure control, adjustment of the mA and/or kV according to patient size and/or use of iterative reconstruction technique. CONTRAST:  OMNIPAQUE  IOHEXOL  350 MG/ML SOLN COMPARISON:  CTA abdomen and pelvis dated 08/12/2023 FINDINGS: VASCULAR Aortic atherosclerosis. Mild narrowing of the SMA  origin due to atherosclerotic plaque. Remainder of the SMA is patent. Normal caliber aorta and branch vessels without aneurysm, dissection, or vasculitis. No active extravasation. Veins: No obvious venous abnormality. Review of the MIP images confirms the above findings. NON-VASCULAR Lower chest: Mild interlobular septal thickening. No pleural effusion or pneumothorax demonstrated. Partially imaged heart size is normal. Coronary artery calcifications. Hepatobiliary: No focal hepatic lesions. No intra or extrahepatic biliary ductal dilation. Cholecystectomy. Pancreas: No focal lesions or main ductal dilation. Spleen: Enlarged spleen measures 13.5 cm, unchanged. Adrenals/Urinary Tract: No adrenal nodules. No suspicious renal mass, calculi or hydronephrosis. No focal bladder wall thickening. Stomach/Bowel: Normal appearance of the stomach. No evidence of bowel wall thickening, distention, or inflammatory changes. Segmentally underdistended colon demonstrates mural thickening in the areas of underdistention. Colonic diverticulosis without acute  diverticulitis. Normal appendix. Lymphatic: No enlarged abdominal or pelvic lymph nodes. Reproductive: No adnexal masses. Other: No free fluid, fluid collection, or free air. Musculoskeletal: No acute or abnormal lytic or blastic osseous lesions. Multilevel degenerative changes of the partially imaged thoracic and lumbar spine. IMPRESSION: 1. No evidence of active extravasation. 2. Mild narrowing of the SMA origin due to atherosclerotic plaque. Remainder of the SMA is patent. 3. Segmentally underdistended colon demonstrates mural thickening in the areas of underdistention. No definite CT findings of colitis. 4. Mild interlobular septal thickening in the lower lungs, which may represent mild pulmonary edema. 5. Stable splenomegaly. 6.  Aortic Atherosclerosis (ICD10-I70.0). Electronically Signed   By: Limin  Xu M.D.   On: 01/16/2024 19:46           LOS: 0 days   Time spent= 35 mins    Maggie Schooner, MD [facility]  If 7PM-7AM, please contact night-coverage  01/17/2024, 11:06 AM

## 2024-01-17 NOTE — TOC Transition Note (Signed)
 Transition of Care [facility]) - Discharge Note  Patient Details  Name: Kristen Wilson MRN: [id number] Date of Birth: Jul 26, 1951  [facility] of [facility]) CM/SW Contact:  Zenon Hilda, LCSW Phone Number: 01/17/2024, 10:17 AM  Clinical Narrative: PT/OT evaluations did not recommend any follow up or DME. TOC signing off.  Final next level of care: Home/Self Care Barriers to Discharge: Barriers Resolved  Patient Goals and CMS Choice Choice offered to / list presented to : NA  Discharge Plan and Services Additional resources added to the After Visit Summary for   In-house Referral: Clinical Social Work    DME Arranged: N/A DME Agency: NA  Social Drivers of Health (SDOH) Interventions SDOH Screenings   Food Insecurity: No Food Insecurity (01/15/2024)  Housing: Low Risk  (01/15/2024)  Transportation Needs: No Transportation Needs (01/15/2024)  Utilities: Not At Risk (01/15/2024)  Social Connections: Patient Declined (01/15/2024)  Tobacco Use: Medium Risk (01/16/2024)   Readmission Risk Interventions     No data to display

## 2024-01-17 NOTE — Plan of Care (Signed)

## 2024-01-18 DIAGNOSIS — E876 Hypokalemia: Secondary | ICD-10-CM | POA: Diagnosis not present

## 2024-01-18 LAB — BASIC METABOLIC PANEL WITH GFR
Anion gap: 4 — ABNORMAL LOW (ref 5–15)
BUN: 14 mg/dL (ref 8–23)
CO2: 30 mmol/L (ref 22–32)
Calcium: 8.9 mg/dL (ref 8.9–10.3)
Chloride: 104 mmol/L (ref 98–111)
Creatinine, Ser: 0.83 mg/dL (ref 0.44–1.00)
GFR, Estimated: >60 mL/min (ref >60)
Glucose, Bld: 99 mg/dL (ref 70–99)
Potassium: 3.9 mmol/L (ref 3.5–5.1)
Sodium: 138 mmol/L (ref 135–145)

## 2024-01-18 LAB — MAGNESIUM: Magnesium: 1.6 mg/dL — ABNORMAL LOW (ref 1.7–2.4)

## 2024-01-18 LAB — CALCIUM, IONIZED: Calcium, Ionized, Serum: 4.6 mg/dL (ref 4.5–5.6)

## 2024-01-18 MED ORDER — MAGNESIUM SULFATE 2 GM/50ML IV SOLN: 2.0000 g | Freq: Once | INTRAVENOUS | Status: AC

## 2024-01-18 MED ORDER — MAGNESIUM OXIDE -MG SUPPLEMENT 400 (240 MG) MG PO TABS
800.0000 mg | ORAL_TABLET | Freq: Once | ORAL | Status: AC
  Administered 2024-01-18: 800 mg via ORAL
  Filled 2024-01-18: qty 2

## 2024-01-18 MED ORDER — MAGNESIUM SULFATE 2 GM/50ML IV SOLN
INTRAVENOUS | Status: AC
  Administered 2024-01-18: 2 g via INTRAVENOUS
  Filled 2024-01-18: qty 50

## 2024-01-18 MED ORDER — AZITHROMYCIN 500 MG PO TABS: 500.0000 mg | ORAL_TABLET | Freq: Every day | ORAL | 0 refills | Status: AC

## 2024-01-18 NOTE — Plan of Care (Signed)

## 2024-01-18 NOTE — Progress Notes (Signed)
 PROGRESS NOTE    Kristen Wilson  UJW:[id number] DOB: [birth year] DOA: 01/15/2024 PCP: Helyn Lobstein, MD    Brief Narrative:   73 year old with history of P A-fib on Eliquis , HTN, HLD, diverticulosis, depression/anxiety comes to the ED for evaluation of abnormal labs.  Patient states she has been having diarrhea for the past several days prior to the hospital admission and went to go see her PCP.  Routine blood work showed severe hypokalemia, hypomagnesemia therefore referred to the hospital.  Eventually GI panel was positive for Campylobacter therefore received 3 days of azithromycin .  Today diarrhea has significantly improved along with electrolytes and she is feeling stronger.  Will discharge her in stable condition.  Assessment & Plan:  Principal Problem:   Hypokalemia Active Problems:   Hypomagnesemia   Paroxysmal atrial fibrillation (HCC)   Hypertension   Hyperlipidemia   Depression with anxiety   Hypokalemia/hypomagnesemia/hypocalcemia Likely in the setting of diarrhea and HCTZ use.  They are now improving.  Aggressive repletion  Diarrhea secondary to Campylobacter gastroenteritis - C. difficile negative, GI panel positive for Campylobacter.  3 days of azithromycin  -Had 1 episode of bright red blood per rectum yesterday.  Hemoglobin is stable, CTA abdomen pelvis is negative for any active bleeding.  Likely from diverticular/hemorrhoid bleed. otherwise hemodynamically stable.  Colonoscopy December 2024-hemorrhoids, diverticulosis   Paroxysmal atrial fibrillation: Sinus rhythm.  Lopressor .  Hold Eliquis  for 1 week   Hypertension: Continue Lopressor    Hyperlipidemia: Continue atorvastatin .   Depression/anxiety: Continue Cymbalta  and home Xanax  0.5 mg 3 times daily.  PT/OT-no follow-up  DVT prophylaxis: None due to bright red blood per rectum    Code Status: Full Code Family Communication:   Discharge    Subjective: Doing significantly well, tells me her  diarrhea has significantly improved.  Examination:  General exam: Appears calm and comfortable  Respiratory system: Clear to auscultation. Respiratory effort normal. Cardiovascular system: S1 & S2 heard, RRR. No JVD, murmurs, rubs, gallops or clicks. No pedal edema. Gastrointestinal system: Abdomen is nondistended, soft and nontender. No organomegaly or masses felt. Normal bowel sounds heard. Central nervous system: Alert and oriented. No focal neurological deficits. Extremities: Symmetric 5 x 5 power. Skin: No rashes, lesions or ulcers Psychiatry: Judgement and insight appear normal. Mood & affect appropriate.                Diet Orders (From admission, onward)     Start     Ordered   01/15/24 1932  Diet Heart Fluid consistency: Thin  Diet effective now       Question:  Fluid consistency:  Answer:  Thin   01/15/24 1931            Objective: Vitals:   01/17/24 0453 01/17/24 1335 01/17/24 2057 01/18/24 0252  BP: (!) 153/87 (!) 155/93 (!) 140/74 (!) 144/86  Pulse: 74 77 72 63  Resp: 20  20 20   Temp: 98.4 F (36.9 C) 98.5 F (36.9 C) 98.4 F (36.9 C) 98.7 F (37.1 C)  TempSrc: Oral Oral Oral Oral  SpO2: 96% 100% 95% 100%  Weight:      Height:        Intake/Output Summary (Last 24 hours) at 01/18/2024 1215 Last data filed at 01/18/2024 1123 Gross per 24 hour  Intake 559.88 ml  Output --  Net 559.88 ml   Filed Weights   01/16/24 0900  Weight: 65.4 kg    Scheduled Meds:  ALPRAZolam   0.5 mg Oral TID   atorvastatin   20  mg Oral QHS   azithromycin   500 mg Oral Daily   DULoxetine   60 mg Oral Daily   irbesartan   300 mg Oral Daily   metoprolol  tartrate  50 mg Oral BID   pantoprazole   40 mg Oral QAC breakfast   sodium chloride  flush  3 mL Intravenous Q12H   Continuous Infusions:  Nutritional status     Body mass index is 27.23 kg/m.  Data Reviewed:   CBC: Recent Labs  Lab 01/15/24 1620 01/16/24 1107 01/16/24 1715  WBC 6.0 4.6  --    NEUTROABS 3.2  --   --   HGB 12.1 10.7* 10.9*  HCT 38.3 34.7* 34.7*  MCV 88.7 89.0  --   PLT 177 143*  --    Basic Metabolic Panel: Recent Labs  Lab 01/15/24 1620 01/15/24 2244 01/16/24 1107 01/17/24 0446 01/17/24 1820 01/18/24 0618  NA 140  --  139 139  --  138  K 2.5* 2.4* 3.6 3.0* 4.1 3.9  CL 95*  --  106 102  --  104  CO2 35*  --  27 30  --  30  GLUCOSE 111*  --  112* 100*  --  99  BUN 11  --  7* 13  --  14  CREATININE 0.91  --  0.72 0.74  --  0.83  CALCIUM  8.2*  --  7.7* 8.5*  --  8.9  MG 1.1* 1.6* 2.3 2.0  --  1.6*  PHOS  --   --  2.2* 4.6  --   --    GFR: Estimated Creatinine Clearance: 52.2 mL/min (by C-G formula based on SCr of 0.83 mg/dL). Liver Function Tests: No results for input(s): "AST", "ALT", "ALKPHOS", "BILITOT", "PROT", "ALBUMIN" in the last 168 hours. No results for input(s): "LIPASE", "AMYLASE" in the last 168 hours. No results for input(s): "AMMONIA" in the last 168 hours. Coagulation Profile: No results for input(s): "INR", "PROTIME" in the last 168 hours. Cardiac Enzymes: No results for input(s): "CKTOTAL", "CKMB", "CKMBINDEX", "TROPONINI" in the last 168 hours. BNP (last 3 results) No results for input(s): "PROBNP" in the last 8760 hours. HbA1C: No results for input(s): "HGBA1C" in the last 72 hours. CBG: No results for input(s): "GLUCAP" in the last 168 hours. Lipid Profile: No results for input(s): "CHOL", "HDL", "LDLCALC", "TRIG", "CHOLHDL", "LDLDIRECT" in the last 72 hours. Thyroid  Function Tests: No results for input(s): "TSH", "T4TOTAL", "FREET4", "T3FREE", "THYROIDAB" in the last 72 hours. Anemia Panel: No results for input(s): "VITAMINB12", "FOLATE", "FERRITIN", "TIBC", "IRON", "RETICCTPCT" in the last 72 hours. Sepsis Labs: No results for input(s): "PROCALCITON", "LATICACIDVEN" in the last 168 hours.  Recent Results (from the past 240 hours)  Gastrointestinal Panel by PCR , Stool     Status: Abnormal   Collection Time: 01/16/24  10:16 AM   Specimen: STOOL  Result Value Ref Range Status   Campylobacter species DETECTED (A) NOT DETECTED Final    Comment: RESULT CALLED TO, READ BACK BY AND VERIFIED WITH: LESIA MCKNIGHT AT 1330 01/17/24.PMF    Plesimonas shigelloides NOT DETECTED NOT DETECTED Final   Salmonella species NOT DETECTED NOT DETECTED Final   Yersinia enterocolitica NOT DETECTED NOT DETECTED Final   Vibrio species NOT DETECTED NOT DETECTED Final   Vibrio cholerae NOT DETECTED NOT DETECTED Final   Enteroaggregative E coli (EAEC) NOT DETECTED NOT DETECTED Final   Enteropathogenic E coli (EPEC) NOT DETECTED NOT DETECTED Final   Enterotoxigenic E coli (ETEC) NOT DETECTED NOT DETECTED Final   Shiga  like toxin producing E coli (STEC) NOT DETECTED NOT DETECTED Final   Shigella/Enteroinvasive E coli (EIEC) NOT DETECTED NOT DETECTED Final   Cryptosporidium NOT DETECTED NOT DETECTED Final   Cyclospora cayetanensis NOT DETECTED NOT DETECTED Final   Entamoeba histolytica NOT DETECTED NOT DETECTED Final   Giardia lamblia NOT DETECTED NOT DETECTED Final   Adenovirus F40/41 NOT DETECTED NOT DETECTED Final   Astrovirus NOT DETECTED NOT DETECTED Final   Norovirus GI/GII NOT DETECTED NOT DETECTED Final   Rotavirus A NOT DETECTED NOT DETECTED Final   Sapovirus (I, II, IV, and V) NOT DETECTED NOT DETECTED Final    Comment: Performed at [facility], [address] [place], [place] [address]  C Difficile Quick Screen w PCR reflex     Status: None   Collection Time: 01/16/24 10:16 AM   Specimen: STOOL  Result Value Ref Range Status   C Diff antigen NEGATIVE NEGATIVE Final   C Diff toxin NEGATIVE NEGATIVE Final   C Diff interpretation No C. difficile detected.  Final    Comment: Performed at [facility], [address] [place], [place] [address]         Radiology Studies: CT ANGIO GI BLEED Result Date: 01/16/2024 CLINICAL DATA:  Mesenteric ischemia and acute GI bleed EXAM: CTA ABDOMEN  AND PELVIS WITHOUT AND WITH CONTRAST TECHNIQUE: Multidetector CT imaging of the abdomen and pelvis was performed using the standard protocol during bolus administration of intravenous contrast. Multiplanar reconstructed images and MIPs were obtained and reviewed to evaluate the vascular anatomy. RADIATION DOSE REDUCTION: This exam was performed according to the departmental dose-optimization program which includes automated exposure control, adjustment of the mA and/or kV according to patient size and/or use of iterative reconstruction technique. CONTRAST:  OMNIPAQUE  IOHEXOL  350 MG/ML SOLN COMPARISON:  CTA abdomen and pelvis dated 08/12/2023 FINDINGS: VASCULAR Aortic atherosclerosis. Mild narrowing of the SMA origin due to atherosclerotic plaque. Remainder of the SMA is patent. Normal caliber aorta and branch vessels without aneurysm, dissection, or vasculitis. No active extravasation. Veins: No obvious venous abnormality. Review of the MIP images confirms the above findings. NON-VASCULAR Lower chest: Mild interlobular septal thickening. No pleural effusion or pneumothorax demonstrated. Partially imaged heart size is normal. Coronary artery calcifications. Hepatobiliary: No focal hepatic lesions. No intra or extrahepatic biliary ductal dilation. Cholecystectomy. Pancreas: No focal lesions or main ductal dilation. Spleen: Enlarged spleen measures 13.5 cm, unchanged. Adrenals/Urinary Tract: No adrenal nodules. No suspicious renal mass, calculi or hydronephrosis. No focal bladder wall thickening. Stomach/Bowel: Normal appearance of the stomach. No evidence of bowel wall thickening, distention, or inflammatory changes. Segmentally underdistended colon demonstrates mural thickening in the areas of underdistention. Colonic diverticulosis without acute diverticulitis. Normal appendix. Lymphatic: No enlarged abdominal or pelvic lymph nodes. Reproductive: No adnexal masses. Other: No free fluid, fluid collection, or  free air. Musculoskeletal: No acute or abnormal lytic or blastic osseous lesions. Multilevel degenerative changes of the partially imaged thoracic and lumbar spine. IMPRESSION: 1. No evidence of active extravasation. 2. Mild narrowing of the SMA origin due to atherosclerotic plaque. Remainder of the SMA is patent. 3. Segmentally underdistended colon demonstrates mural thickening in the areas of underdistention. No definite CT findings of colitis. 4. Mild interlobular septal thickening in the lower lungs, which may represent mild pulmonary edema. 5. Stable splenomegaly. 6.  Aortic Atherosclerosis (ICD10-I70.0). Electronically Signed   By: Limin  Xu M.D.   On: 01/16/2024 19:46           LOS: 0  days   Time spent= 35 mins    Maggie Schooner, MD [facility]  If 7PM-7AM, please contact night-coverage  01/18/2024, 12:15 PM

## 2024-01-18 NOTE — Discharge Summary (Signed)
 Physician Discharge Summary  Kristen Wilson AOZ:[id number] DOB: 01-23-1951 DOA: 01/15/2024  PCP: Helyn Lobstein, MD  Admit date: 01/15/2024 Discharge date: 01/18/2024  Admitted From: Home Disposition:  Home  Recommendations for Outpatient Follow-up:  Follow up with PCP in 1-2 weeks Please obtain BMP/CBC in one week your next doctors visit.  1 more day of PO azithroymcin   Discharge Condition: Stable   Brief/Interim Summary: Brief Narrative:   73 year old with history of P A-fib on Eliquis , HTN, HLD, diverticulosis, depression/anxiety comes to the ED for evaluation of abnormal labs.  Patient states she has been having diarrhea for the past several days prior to the hospital admission and went to go see her PCP.  Routine blood work showed severe hypokalemia, hypomagnesemia therefore referred to the hospital.  Eventually GI panel was positive for Campylobacter therefore received 3 days of azithromycin .  Today diarrhea has significantly improved along with electrolytes and she is feeling stronger.  Will discharge her in stable condition.  Assessment & Plan:  Principal Problem:   Hypokalemia Active Problems:   Hypomagnesemia   Paroxysmal atrial fibrillation (HCC)   Hypertension   Hyperlipidemia   Depression with anxiety   Hypokalemia/hypomagnesemia/hypocalcemia Likely in the setting of diarrhea and HCTZ use.  They are now improving.  Aggressive repletion  Diarrhea secondary to Campylobacter gastroenteritis - C. difficile negative, GI panel positive for Campylobacter.  3 days of azithromycin  -Had 1 episode of bright red blood per rectum yesterday.  Hemoglobin is stable, CTA abdomen pelvis is negative for any active bleeding.  Likely from diverticular/hemorrhoid bleed. otherwise hemodynamically stable.  Colonoscopy December 2024-hemorrhoids, diverticulosis   Paroxysmal atrial fibrillation: Sinus rhythm.  Lopressor .  Hold Eliquis  for 1 week   Hypertension: Continue Lopressor     Hyperlipidemia: Continue atorvastatin .   Depression/anxiety: Continue Cymbalta  and home Xanax  0.5 mg 3 times daily.  PT/OT-no follow-up  DVT prophylaxis: None due to bright red blood per rectum    Code Status: Full Code Family Communication:   Discharge    Subjective: Doing significantly well, tells me her diarrhea has significantly improved.  Examination:  General exam: Appears calm and comfortable  Respiratory system: Clear to auscultation. Respiratory effort normal. Cardiovascular system: S1 & S2 heard, RRR. No JVD, murmurs, rubs, gallops or clicks. No pedal edema. Gastrointestinal system: Abdomen is nondistended, soft and nontender. No organomegaly or masses felt. Normal bowel sounds heard. Central nervous system: Alert and oriented. No focal neurological deficits. Extremities: Symmetric 5 x 5 power. Skin: No rashes, lesions or ulcers Psychiatry: Judgement and insight appear normal. Mood & affect appropriate.    Discharge Diagnoses:  Principal Problem:   Hypokalemia Active Problems:   Hypomagnesemia   Paroxysmal atrial fibrillation (HCC)   Hypertension   Hyperlipidemia   Depression with anxiety      Discharge Exam: Vitals:   01/17/24 2057 01/18/24 0252  BP: (!) 140/74 (!) 144/86  Pulse: 72 63  Resp: 20 20  Temp: 98.4 F (36.9 C) 98.7 F (37.1 C)  SpO2: 95% 100%   Vitals:   01/17/24 0453 01/17/24 1335 01/17/24 2057 01/18/24 0252  BP: (!) 153/87 (!) 155/93 (!) 140/74 (!) 144/86  Pulse: 74 77 72 63  Resp: 20  20 20   Temp: 98.4 F (36.9 C) 98.5 F (36.9 C) 98.4 F (36.9 C) 98.7 F (37.1 C)  TempSrc: Oral Oral Oral Oral  SpO2: 96% 100% 95% 100%  Weight:      Height:          Discharge Instructions  Allergies as of 01/18/2024       Reactions   Latex Hives   Nsaids Other (See Comments), Hypertension   Aleve / Ibuprofen  - causes Blood Pressure to rise   Percocet [oxycodone -acetaminophen ] Nausea And Vomiting        Medication List      PAUSE taking these medications    hydrochlorothiazide  25 MG tablet Wait to take this until your doctor or other care provider tells you to start again. Commonly known as: HYDRODIURIL  Take 25 mg by mouth daily.       TAKE these medications    ALPRAZolam  0.5 MG tablet Commonly known as: XANAX  Take 0.5 mg by mouth in the morning, at noon, and at bedtime.   apixaban  5 MG Tabs tablet Commonly known as: ELIQUIS  Take 1 tablet (5 mg total) by mouth 2 (two) times daily. Please hold  Eliquis  7 more days What changed: additional instructions   atorvastatin  20 MG tablet Commonly known as: LIPITOR  Take 1 tablet (20 mg total) by mouth daily. What changed: when to take this   azithromycin  500 MG tablet Commonly known as: ZITHROMAX  Take 1 tablet (500 mg total) by mouth daily for 1 day. Start taking on: Jan 19, 2024   dicyclomine  20 MG tablet Commonly known as: BENTYL  Take 20 mg by mouth 2 (two) times daily as needed for spasms.   DULoxetine  60 MG capsule Commonly known as: CYMBALTA  Take 60 mg by mouth in the morning.   irbesartan  300 MG tablet Commonly known as: AVAPRO  Take 300 mg by mouth daily.   metoprolol  tartrate 50 MG tablet Commonly known as: LOPRESSOR  Take 1 tablet (50 mg total) by mouth 2 (two) times daily.   pantoprazole  40 MG tablet Commonly known as: PROTONIX  Take 40 mg by mouth daily before breakfast.   Tylenol  8 Hour Arthritis Pain 650 MG CR tablet Generic drug: acetaminophen  Take 1,300 mg by mouth in the morning, at noon, and at bedtime.        Follow-up Information     Helyn Lobstein, MD Follow up in 1 week(s).   Specialty: Family Medicine Contact information: [address] [place] [address] [phone]                Allergies  Allergen Reactions   Latex Hives   Nsaids Other (See Comments) and Hypertension    Aleve / Ibuprofen  - causes Blood Pressure to rise   Percocet [Oxycodone -Acetaminophen ] Nausea And Vomiting     You were cared for by a hospitalist during your hospital stay. If you have any questions about your discharge medications or the care you received while you were in the hospital after you are discharged, you can call the unit and asked to speak with the hospitalist on call if the hospitalist that took care of you is not available. Once you are discharged, your primary care physician will handle any further medical issues. Please note that no refills for any discharge medications will be authorized once you are discharged, as it is imperative that you return to your primary care physician (or establish a relationship with a primary care physician if you do not have one) for your aftercare needs so that they can reassess your need for medications and monitor your lab values.  You were cared for by a hospitalist during your hospital stay. If you have any questions about your discharge medications or the care you received while you were in the hospital after you are discharged, you  can call the unit and asked to speak with the hospitalist on call if the hospitalist that took care of you is not available. Once you are discharged, your primary care physician will handle any further medical issues. Please note that NO REFILLS for any discharge medications will be authorized once you are discharged, as it is imperative that you return to your primary care physician (or establish a relationship with a primary care physician if you do not have one) for your aftercare needs so that they can reassess your need for medications and monitor your lab values.  Please request your Prim.MD to go over all Hospital Tests and Procedure/Radiological results at the follow up, please get all Hospital records sent to your Prim MD by signing hospital release before you go home.  Get CBC, CMP, 2 view Chest X ray checked  by Primary MD during your next visit or SNF MD in 5-7 days ( we routinely change or add medications that can  affect your baseline labs and fluid status, therefore we recommend that you get the mentioned basic workup next visit with your PCP, your PCP may decide not to get them or add new tests based on their clinical decision)  On your next visit with your primary care physician please Get Medicines reviewed and adjusted.  If you experience worsening of your admission symptoms, develop shortness of breath, life threatening emergency, suicidal or homicidal thoughts you must seek medical attention immediately by calling 911 or calling your MD immediately  if symptoms less severe.  You Must read complete instructions/literature along with all the possible adverse reactions/side effects for all the Medicines you take and that have been prescribed to you. Take any new Medicines after you have completely understood and accpet all the possible adverse reactions/side effects.   Do not drive, operate heavy machinery, perform activities at heights, swimming or participation in water activities or provide baby sitting services if your were admitted for syncope or siezures until you have seen by Primary MD or a Neurologist and advised to do so again.  Do not drive when taking Pain medications.   Procedures/Studies: CT ANGIO GI BLEED Result Date: 01/16/2024 CLINICAL DATA:  Mesenteric ischemia and acute GI bleed EXAM: CTA ABDOMEN AND PELVIS WITHOUT AND WITH CONTRAST TECHNIQUE: Multidetector CT imaging of the abdomen and pelvis was performed using the standard protocol during bolus administration of intravenous contrast. Multiplanar reconstructed images and MIPs were obtained and reviewed to evaluate the vascular anatomy. RADIATION DOSE REDUCTION: This exam was performed according to the departmental dose-optimization program which includes automated exposure control, adjustment of the mA and/or kV according to patient size and/or use of iterative reconstruction technique. CONTRAST:  OMNIPAQUE  IOHEXOL  350 MG/ML SOLN  COMPARISON:  CTA abdomen and pelvis dated 08/12/2023 FINDINGS: VASCULAR Aortic atherosclerosis. Mild narrowing of the SMA origin due to atherosclerotic plaque. Remainder of the SMA is patent. Normal caliber aorta and branch vessels without aneurysm, dissection, or vasculitis. No active extravasation. Veins: No obvious venous abnormality. Review of the MIP images confirms the above findings. NON-VASCULAR Lower chest: Mild interlobular septal thickening. No pleural effusion or pneumothorax demonstrated. Partially imaged heart size is normal. Coronary artery calcifications. Hepatobiliary: No focal hepatic lesions. No intra or extrahepatic biliary ductal dilation. Cholecystectomy. Pancreas: No focal lesions or main ductal dilation. Spleen: Enlarged spleen measures 13.5 cm, unchanged. Adrenals/Urinary Tract: No adrenal nodules. No suspicious renal mass, calculi or hydronephrosis. No focal bladder wall thickening. Stomach/Bowel: Normal appearance of the stomach. No evidence of  bowel wall thickening, distention, or inflammatory changes. Segmentally underdistended colon demonstrates mural thickening in the areas of underdistention. Colonic diverticulosis without acute diverticulitis. Normal appendix. Lymphatic: No enlarged abdominal or pelvic lymph nodes. Reproductive: No adnexal masses. Other: No free fluid, fluid collection, or free air. Musculoskeletal: No acute or abnormal lytic or blastic osseous lesions. Multilevel degenerative changes of the partially imaged thoracic and lumbar spine. IMPRESSION: 1. No evidence of active extravasation. 2. Mild narrowing of the SMA origin due to atherosclerotic plaque. Remainder of the SMA is patent. 3. Segmentally underdistended colon demonstrates mural thickening in the areas of underdistention. No definite CT findings of colitis. 4. Mild interlobular septal thickening in the lower lungs, which may represent mild pulmonary edema. 5. Stable splenomegaly. 6.  Aortic Atherosclerosis  (ICD10-I70.0). Electronically Signed   By: Limin  Xu M.D.   On: 01/16/2024 19:46     The results of significant diagnostics from this hospitalization (including imaging, microbiology, ancillary and laboratory) are listed below for reference.     Microbiology: Recent Results (from the past 240 hours)  Gastrointestinal Panel by PCR , Stool     Status: Abnormal   Collection Time: 01/16/24 10:16 AM   Specimen: STOOL  Result Value Ref Range Status   Campylobacter species DETECTED (A) NOT DETECTED Final    Comment: RESULT CALLED TO, READ BACK BY AND VERIFIED WITH: LESIA MCKNIGHT AT 1330 01/17/24.PMF    Plesimonas shigelloides NOT DETECTED NOT DETECTED Final   Salmonella species NOT DETECTED NOT DETECTED Final   Yersinia enterocolitica NOT DETECTED NOT DETECTED Final   Vibrio species NOT DETECTED NOT DETECTED Final   Vibrio cholerae NOT DETECTED NOT DETECTED Final   Enteroaggregative E coli (EAEC) NOT DETECTED NOT DETECTED Final   Enteropathogenic E coli (EPEC) NOT DETECTED NOT DETECTED Final   Enterotoxigenic E coli (ETEC) NOT DETECTED NOT DETECTED Final   Shiga like toxin producing E coli (STEC) NOT DETECTED NOT DETECTED Final   Shigella/Enteroinvasive E coli (EIEC) NOT DETECTED NOT DETECTED Final   Cryptosporidium NOT DETECTED NOT DETECTED Final   Cyclospora cayetanensis NOT DETECTED NOT DETECTED Final   Entamoeba histolytica NOT DETECTED NOT DETECTED Final   Giardia lamblia NOT DETECTED NOT DETECTED Final   Adenovirus F40/41 NOT DETECTED NOT DETECTED Final   Astrovirus NOT DETECTED NOT DETECTED Final   Norovirus GI/GII NOT DETECTED NOT DETECTED Final   Rotavirus A NOT DETECTED NOT DETECTED Final   Sapovirus (I, II, IV, and V) NOT DETECTED NOT DETECTED Final    Comment: Performed at [facility], [address] [place], [place] [address]  C Difficile Quick Screen w PCR reflex     Status: None   Collection Time: 01/16/24 10:16 AM   Specimen: STOOL  Result Value Ref Range  Status   C Diff antigen NEGATIVE NEGATIVE Final   C Diff toxin NEGATIVE NEGATIVE Final   C Diff interpretation No C. difficile detected.  Final    Comment: Performed at [facility], [address] [place], [place] [address]     Labs: BNP (last 3 results) No results for input(s): "BNP" in the last 8760 hours. Basic Metabolic Panel: Recent Labs  Lab 01/15/24 1620 01/15/24 2244 01/16/24 1107 01/17/24 0446 01/17/24 1820 01/18/24 0618  NA 140  --  139 139  --  138  K 2.5* 2.4* 3.6 3.0* 4.1 3.9  CL 95*  --  106 102  --  104  CO2 35*  --  27 30  --  30  GLUCOSE 111*  --  112* 100*  --  99  BUN 11  --  7* 13  --  14  CREATININE 0.91  --  0.72 0.74  --  0.83  CALCIUM  8.2*  --  7.7* 8.5*  --  8.9  MG 1.1* 1.6* 2.3 2.0  --  1.6*  PHOS  --   --  2.2* 4.6  --   --    Liver Function Tests: No results for input(s): "AST", "ALT", "ALKPHOS", "BILITOT", "PROT", "ALBUMIN" in the last 168 hours. No results for input(s): "LIPASE", "AMYLASE" in the last 168 hours. No results for input(s): "AMMONIA" in the last 168 hours. CBC: Recent Labs  Lab 01/15/24 1620 01/16/24 1107 01/16/24 1715  WBC 6.0 4.6  --   NEUTROABS 3.2  --   --   HGB 12.1 10.7* 10.9*  HCT 38.3 34.7* 34.7*  MCV 88.7 89.0  --   PLT 177 143*  --    Cardiac Enzymes: No results for input(s): "CKTOTAL", "CKMB", "CKMBINDEX", "TROPONINI" in the last 168 hours. BNP: Invalid input(s): "POCBNP" CBG: No results for input(s): "GLUCAP" in the last 168 hours. D-Dimer No results for input(s): "DDIMER" in the last 72 hours. Hgb A1c No results for input(s): "HGBA1C" in the last 72 hours. Lipid Profile No results for input(s): "CHOL", "HDL", "LDLCALC", "TRIG", "CHOLHDL", "LDLDIRECT" in the last 72 hours. Thyroid  function studies No results for input(s): "TSH", "T4TOTAL", "T3FREE", "THYROIDAB" in the last 72 hours.  Invalid input(s): "FREET3" Anemia work up No results for input(s): "VITAMINB12", "FOLATE",  "FERRITIN", "TIBC", "IRON", "RETICCTPCT" in the last 72 hours. Urinalysis    Component Value Date/Time   COLORURINE AMBER (A) 08/12/2023 1301   APPEARANCEUR CLEAR 08/12/2023 1301   LABSPEC 1.019 08/12/2023 1301   PHURINE 7.0 08/12/2023 1301   GLUCOSEU NEGATIVE 08/12/2023 1301   HGBUR MODERATE (A) 08/12/2023 1301   BILIRUBINUR NEGATIVE 08/12/2023 1301   KETONESUR NEGATIVE 08/12/2023 1301   PROTEINUR NEGATIVE 08/12/2023 1301   NITRITE NEGATIVE 08/12/2023 1301   LEUKOCYTESUR NEGATIVE 08/12/2023 1301   Sepsis Labs Recent Labs  Lab 01/15/24 1620 01/16/24 1107  WBC 6.0 4.6   Microbiology Recent Results (from the past 240 hours)  Gastrointestinal Panel by PCR , Stool     Status: Abnormal   Collection Time: 01/16/24 10:16 AM   Specimen: STOOL  Result Value Ref Range Status   Campylobacter species DETECTED (A) NOT DETECTED Final    Comment: RESULT CALLED TO, READ BACK BY AND VERIFIED WITH: LESIA MCKNIGHT AT 1330 01/17/24.PMF    Plesimonas shigelloides NOT DETECTED NOT DETECTED Final   Salmonella species NOT DETECTED NOT DETECTED Final   Yersinia enterocolitica NOT DETECTED NOT DETECTED Final   Vibrio species NOT DETECTED NOT DETECTED Final   Vibrio cholerae NOT DETECTED NOT DETECTED Final   Enteroaggregative E coli (EAEC) NOT DETECTED NOT DETECTED Final   Enteropathogenic E coli (EPEC) NOT DETECTED NOT DETECTED Final   Enterotoxigenic E coli (ETEC) NOT DETECTED NOT DETECTED Final   Shiga like toxin producing E coli (STEC) NOT DETECTED NOT DETECTED Final   Shigella/Enteroinvasive E coli (EIEC) NOT DETECTED NOT DETECTED Final   Cryptosporidium NOT DETECTED NOT DETECTED Final   Cyclospora cayetanensis NOT DETECTED NOT DETECTED Final   Entamoeba histolytica NOT DETECTED NOT DETECTED Final   Giardia lamblia NOT DETECTED NOT DETECTED Final   Adenovirus F40/41 NOT DETECTED NOT DETECTED Final   Astrovirus NOT DETECTED NOT DETECTED Final   Norovirus GI/GII NOT DETECTED NOT DETECTED  Final  Rotavirus A NOT DETECTED NOT DETECTED Final   Sapovirus (I, II, IV, and V) NOT DETECTED NOT DETECTED Final    Comment: Performed at [facility], [address] [place], [place] [address]  C Difficile Quick Screen w PCR reflex     Status: None   Collection Time: 01/16/24 10:16 AM   Specimen: STOOL  Result Value Ref Range Status   C Diff antigen NEGATIVE NEGATIVE Final   C Diff toxin NEGATIVE NEGATIVE Final   C Diff interpretation No C. difficile detected.  Final    Comment: Performed at [facility], [address], [place], [place] [address]     Time coordinating discharge:  I have spent 35 minutes face to face with the patient and on the ward discussing the patients care, assessment, plan and disposition with other care givers. >50% of the time was devoted counseling the patient about the risks and benefits of treatment/Discharge disposition and coordinating care.   SIGNED:   Maggie Schooner, MD  [facility] 01/18/2024, 1:54 PM   If 7PM-7AM, please contact night-coverage

## 2024-01-18 NOTE — Plan of Care (Signed)
   Problem: Nutrition: Goal: Adequate nutrition will be maintained Outcome: Progressing   Problem: Coping: Goal: Level of anxiety will decrease Outcome: Progressing   Problem: Safety: Goal: Ability to remain free from injury will improve Outcome: Progressing

## 2024-01-22 DIAGNOSIS — Z79899 Other long term (current) drug therapy: Secondary | ICD-10-CM | POA: Diagnosis not present

## 2024-01-22 DIAGNOSIS — Z711 Person with feared health complaint in whom no diagnosis is made: Secondary | ICD-10-CM | POA: Diagnosis not present

## 2024-01-22 DIAGNOSIS — M7542 Impingement syndrome of left shoulder: Secondary | ICD-10-CM | POA: Diagnosis not present

## 2024-01-22 DIAGNOSIS — Z0000 Encounter for general adult medical examination without abnormal findings: Secondary | ICD-10-CM | POA: Diagnosis not present

## 2024-01-22 DIAGNOSIS — E782 Mixed hyperlipidemia: Secondary | ICD-10-CM | POA: Diagnosis not present

## 2024-01-22 DIAGNOSIS — I10 Essential (primary) hypertension: Secondary | ICD-10-CM | POA: Diagnosis not present

## 2024-01-22 DIAGNOSIS — J449 Chronic obstructive pulmonary disease, unspecified: Secondary | ICD-10-CM | POA: Diagnosis not present

## 2024-01-22 DIAGNOSIS — M8580 Other specified disorders of bone density and structure, unspecified site: Secondary | ICD-10-CM | POA: Diagnosis not present

## 2024-01-22 DIAGNOSIS — E876 Hypokalemia: Secondary | ICD-10-CM | POA: Diagnosis not present

## 2024-01-22 DIAGNOSIS — I480 Paroxysmal atrial fibrillation: Secondary | ICD-10-CM | POA: Diagnosis not present

## 2024-01-22 DIAGNOSIS — A045 Campylobacter enteritis: Secondary | ICD-10-CM | POA: Diagnosis not present

## 2024-02-05 LAB — MISCELLANEOUS TEST

## 2024-02-12 DIAGNOSIS — M8588 Other specified disorders of bone density and structure, other site: Secondary | ICD-10-CM | POA: Diagnosis not present

## 2024-02-12 DIAGNOSIS — R29890 Loss of height: Secondary | ICD-10-CM | POA: Diagnosis not present

## 2024-02-12 DIAGNOSIS — Z1231 Encounter for screening mammogram for malignant neoplasm of breast: Secondary | ICD-10-CM | POA: Diagnosis not present

## 2024-02-12 DIAGNOSIS — E559 Vitamin D deficiency, unspecified: Secondary | ICD-10-CM | POA: Diagnosis not present

## 2024-02-17 ENCOUNTER — Encounter: Payer: Self-pay | Admitting: *Deleted

## 2024-03-14 DIAGNOSIS — I700 Atherosclerosis of aorta: Secondary | ICD-10-CM | POA: Diagnosis not present

## 2024-03-14 DIAGNOSIS — J9601 Acute respiratory failure with hypoxia: Secondary | ICD-10-CM | POA: Diagnosis not present

## 2024-03-14 DIAGNOSIS — S32010D Wedge compression fracture of first lumbar vertebra, subsequent encounter for fracture with routine healing: Secondary | ICD-10-CM | POA: Diagnosis not present

## 2024-03-14 DIAGNOSIS — K5730 Diverticulosis of large intestine without perforation or abscess without bleeding: Secondary | ICD-10-CM | POA: Diagnosis not present

## 2024-03-14 DIAGNOSIS — E876 Hypokalemia: Secondary | ICD-10-CM | POA: Diagnosis not present

## 2024-03-14 DIAGNOSIS — M549 Dorsalgia, unspecified: Secondary | ICD-10-CM | POA: Diagnosis not present

## 2024-03-14 DIAGNOSIS — K644 Residual hemorrhoidal skin tags: Secondary | ICD-10-CM | POA: Diagnosis not present

## 2024-03-14 DIAGNOSIS — K5731 Diverticulosis of large intestine without perforation or abscess with bleeding: Secondary | ICD-10-CM | POA: Diagnosis not present

## 2024-03-14 DIAGNOSIS — M51369 Other intervertebral disc degeneration, lumbar region without mention of lumbar back pain or lower extremity pain: Secondary | ICD-10-CM | POA: Diagnosis not present

## 2024-03-14 DIAGNOSIS — Z79899 Other long term (current) drug therapy: Secondary | ICD-10-CM | POA: Diagnosis not present

## 2024-03-14 DIAGNOSIS — I2510 Atherosclerotic heart disease of native coronary artery without angina pectoris: Secondary | ICD-10-CM | POA: Diagnosis not present

## 2024-03-14 DIAGNOSIS — Z9181 History of falling: Secondary | ICD-10-CM | POA: Diagnosis not present

## 2024-03-14 DIAGNOSIS — K760 Fatty (change of) liver, not elsewhere classified: Secondary | ICD-10-CM | POA: Diagnosis not present

## 2024-03-14 DIAGNOSIS — E785 Hyperlipidemia, unspecified: Secondary | ICD-10-CM | POA: Diagnosis not present

## 2024-03-14 DIAGNOSIS — Z96652 Presence of left artificial knee joint: Secondary | ICD-10-CM | POA: Diagnosis not present

## 2024-03-14 DIAGNOSIS — R9431 Abnormal electrocardiogram [ECG] [EKG]: Secondary | ICD-10-CM | POA: Diagnosis not present

## 2024-03-14 DIAGNOSIS — M4856XA Collapsed vertebra, not elsewhere classified, lumbar region, initial encounter for fracture: Secondary | ICD-10-CM | POA: Diagnosis not present

## 2024-03-14 DIAGNOSIS — M4802 Spinal stenosis, cervical region: Secondary | ICD-10-CM | POA: Diagnosis not present

## 2024-03-14 DIAGNOSIS — K5900 Constipation, unspecified: Secondary | ICD-10-CM | POA: Diagnosis not present

## 2024-03-14 DIAGNOSIS — I6782 Cerebral ischemia: Secondary | ICD-10-CM | POA: Diagnosis not present

## 2024-03-14 DIAGNOSIS — I672 Cerebral atherosclerosis: Secondary | ICD-10-CM | POA: Diagnosis not present

## 2024-03-14 DIAGNOSIS — Z043 Encounter for examination and observation following other accident: Secondary | ICD-10-CM | POA: Diagnosis not present

## 2024-03-14 DIAGNOSIS — K922 Gastrointestinal hemorrhage, unspecified: Secondary | ICD-10-CM | POA: Diagnosis not present

## 2024-03-14 DIAGNOSIS — M4313 Spondylolisthesis, cervicothoracic region: Secondary | ICD-10-CM | POA: Diagnosis not present

## 2024-03-14 DIAGNOSIS — M5030 Other cervical disc degeneration, unspecified cervical region: Secondary | ICD-10-CM | POA: Diagnosis not present

## 2024-03-14 DIAGNOSIS — I4891 Unspecified atrial fibrillation: Secondary | ICD-10-CM | POA: Diagnosis not present

## 2024-03-14 DIAGNOSIS — R109 Unspecified abdominal pain: Secondary | ICD-10-CM | POA: Diagnosis not present

## 2024-03-14 DIAGNOSIS — K921 Melena: Secondary | ICD-10-CM | POA: Diagnosis not present

## 2024-03-14 DIAGNOSIS — Z7901 Long term (current) use of anticoagulants: Secondary | ICD-10-CM | POA: Diagnosis not present

## 2024-03-14 DIAGNOSIS — R911 Solitary pulmonary nodule: Secondary | ICD-10-CM | POA: Diagnosis not present

## 2024-03-14 DIAGNOSIS — I10 Essential (primary) hypertension: Secondary | ICD-10-CM | POA: Diagnosis not present

## 2024-03-14 DIAGNOSIS — M4807 Spinal stenosis, lumbosacral region: Secondary | ICD-10-CM | POA: Diagnosis not present

## 2024-03-14 DIAGNOSIS — M545 Low back pain, unspecified: Secondary | ICD-10-CM | POA: Diagnosis not present

## 2024-03-14 DIAGNOSIS — K5790 Diverticulosis of intestine, part unspecified, without perforation or abscess without bleeding: Secondary | ICD-10-CM | POA: Diagnosis not present

## 2024-03-14 DIAGNOSIS — J439 Emphysema, unspecified: Secondary | ICD-10-CM | POA: Diagnosis not present

## 2024-03-14 DIAGNOSIS — S32010A Wedge compression fracture of first lumbar vertebra, initial encounter for closed fracture: Secondary | ICD-10-CM | POA: Diagnosis not present

## 2024-03-14 DIAGNOSIS — R5381 Other malaise: Secondary | ICD-10-CM | POA: Diagnosis not present

## 2024-03-14 DIAGNOSIS — R29890 Loss of height: Secondary | ICD-10-CM | POA: Diagnosis not present

## 2024-03-14 DIAGNOSIS — R079 Chest pain, unspecified: Secondary | ICD-10-CM | POA: Diagnosis not present

## 2024-03-14 DIAGNOSIS — Z87891 Personal history of nicotine dependence: Secondary | ICD-10-CM | POA: Diagnosis not present

## 2024-03-14 DIAGNOSIS — E559 Vitamin D deficiency, unspecified: Secondary | ICD-10-CM | POA: Diagnosis not present

## 2024-03-14 DIAGNOSIS — K625 Hemorrhage of anus and rectum: Secondary | ICD-10-CM | POA: Diagnosis not present

## 2024-03-15 DIAGNOSIS — R109 Unspecified abdominal pain: Secondary | ICD-10-CM | POA: Diagnosis not present

## 2024-03-15 DIAGNOSIS — K5730 Diverticulosis of large intestine without perforation or abscess without bleeding: Secondary | ICD-10-CM | POA: Diagnosis not present

## 2024-03-15 DIAGNOSIS — J439 Emphysema, unspecified: Secondary | ICD-10-CM | POA: Diagnosis not present

## 2024-03-15 DIAGNOSIS — R079 Chest pain, unspecified: Secondary | ICD-10-CM | POA: Diagnosis not present

## 2024-03-19 DIAGNOSIS — R6889 Other general symptoms and signs: Secondary | ICD-10-CM | POA: Diagnosis not present

## 2024-03-19 DIAGNOSIS — E785 Hyperlipidemia, unspecified: Secondary | ICD-10-CM | POA: Diagnosis not present

## 2024-03-19 DIAGNOSIS — S32010D Wedge compression fracture of first lumbar vertebra, subsequent encounter for fracture with routine healing: Secondary | ICD-10-CM | POA: Diagnosis not present

## 2024-03-19 DIAGNOSIS — Z743 Need for continuous supervision: Secondary | ICD-10-CM | POA: Diagnosis not present

## 2024-03-19 DIAGNOSIS — E876 Hypokalemia: Secondary | ICD-10-CM | POA: Diagnosis not present

## 2024-03-19 DIAGNOSIS — Z9181 History of falling: Secondary | ICD-10-CM | POA: Diagnosis not present

## 2024-03-19 DIAGNOSIS — E559 Vitamin D deficiency, unspecified: Secondary | ICD-10-CM | POA: Diagnosis not present

## 2024-03-19 DIAGNOSIS — W19XXXA Unspecified fall, initial encounter: Secondary | ICD-10-CM | POA: Diagnosis not present

## 2024-03-19 DIAGNOSIS — M6281 Muscle weakness (generalized): Secondary | ICD-10-CM | POA: Diagnosis not present

## 2024-03-19 DIAGNOSIS — K5900 Constipation, unspecified: Secondary | ICD-10-CM | POA: Diagnosis not present

## 2024-03-19 DIAGNOSIS — R531 Weakness: Secondary | ICD-10-CM | POA: Diagnosis not present

## 2024-03-19 DIAGNOSIS — K5731 Diverticulosis of large intestine without perforation or abscess with bleeding: Secondary | ICD-10-CM | POA: Diagnosis not present

## 2024-03-19 DIAGNOSIS — I4891 Unspecified atrial fibrillation: Secondary | ICD-10-CM | POA: Diagnosis not present

## 2024-03-19 DIAGNOSIS — R262 Difficulty in walking, not elsewhere classified: Secondary | ICD-10-CM | POA: Diagnosis not present

## 2024-03-19 DIAGNOSIS — K922 Gastrointestinal hemorrhage, unspecified: Secondary | ICD-10-CM | POA: Diagnosis not present

## 2024-03-19 DIAGNOSIS — K219 Gastro-esophageal reflux disease without esophagitis: Secondary | ICD-10-CM | POA: Diagnosis not present

## 2024-03-19 DIAGNOSIS — J9601 Acute respiratory failure with hypoxia: Secondary | ICD-10-CM | POA: Diagnosis not present

## 2024-03-19 DIAGNOSIS — I10 Essential (primary) hypertension: Secondary | ICD-10-CM | POA: Diagnosis not present

## 2024-03-19 DIAGNOSIS — M5459 Other low back pain: Secondary | ICD-10-CM | POA: Diagnosis not present

## 2024-03-20 DIAGNOSIS — K5731 Diverticulosis of large intestine without perforation or abscess with bleeding: Secondary | ICD-10-CM | POA: Diagnosis not present

## 2024-03-20 DIAGNOSIS — Z9181 History of falling: Secondary | ICD-10-CM | POA: Diagnosis not present

## 2024-03-20 DIAGNOSIS — E876 Hypokalemia: Secondary | ICD-10-CM | POA: Diagnosis not present

## 2024-03-20 DIAGNOSIS — I4891 Unspecified atrial fibrillation: Secondary | ICD-10-CM | POA: Diagnosis not present

## 2024-03-20 DIAGNOSIS — S32010D Wedge compression fracture of first lumbar vertebra, subsequent encounter for fracture with routine healing: Secondary | ICD-10-CM | POA: Diagnosis not present

## 2024-03-20 DIAGNOSIS — E785 Hyperlipidemia, unspecified: Secondary | ICD-10-CM | POA: Diagnosis not present

## 2024-03-20 DIAGNOSIS — K922 Gastrointestinal hemorrhage, unspecified: Secondary | ICD-10-CM | POA: Diagnosis not present

## 2024-03-20 DIAGNOSIS — I10 Essential (primary) hypertension: Secondary | ICD-10-CM | POA: Diagnosis not present

## 2024-03-20 DIAGNOSIS — E559 Vitamin D deficiency, unspecified: Secondary | ICD-10-CM | POA: Diagnosis not present

## 2024-03-20 DIAGNOSIS — K5900 Constipation, unspecified: Secondary | ICD-10-CM | POA: Diagnosis not present

## 2024-03-20 DIAGNOSIS — J9601 Acute respiratory failure with hypoxia: Secondary | ICD-10-CM | POA: Diagnosis not present

## 2024-03-23 DIAGNOSIS — I10 Essential (primary) hypertension: Secondary | ICD-10-CM | POA: Diagnosis not present

## 2024-03-23 DIAGNOSIS — K5731 Diverticulosis of large intestine without perforation or abscess with bleeding: Secondary | ICD-10-CM | POA: Diagnosis not present

## 2024-03-23 DIAGNOSIS — K5900 Constipation, unspecified: Secondary | ICD-10-CM | POA: Diagnosis not present

## 2024-03-23 DIAGNOSIS — I4891 Unspecified atrial fibrillation: Secondary | ICD-10-CM | POA: Diagnosis not present

## 2024-03-23 DIAGNOSIS — E876 Hypokalemia: Secondary | ICD-10-CM | POA: Diagnosis not present

## 2024-03-23 DIAGNOSIS — E559 Vitamin D deficiency, unspecified: Secondary | ICD-10-CM | POA: Diagnosis not present

## 2024-03-23 DIAGNOSIS — E785 Hyperlipidemia, unspecified: Secondary | ICD-10-CM | POA: Diagnosis not present

## 2024-03-23 DIAGNOSIS — J9601 Acute respiratory failure with hypoxia: Secondary | ICD-10-CM | POA: Diagnosis not present

## 2024-03-23 DIAGNOSIS — S32010D Wedge compression fracture of first lumbar vertebra, subsequent encounter for fracture with routine healing: Secondary | ICD-10-CM | POA: Diagnosis not present

## 2024-03-23 DIAGNOSIS — K922 Gastrointestinal hemorrhage, unspecified: Secondary | ICD-10-CM | POA: Diagnosis not present

## 2024-03-23 DIAGNOSIS — Z9181 History of falling: Secondary | ICD-10-CM | POA: Diagnosis not present

## 2024-03-24 DIAGNOSIS — E876 Hypokalemia: Secondary | ICD-10-CM | POA: Diagnosis not present

## 2024-03-24 DIAGNOSIS — K5731 Diverticulosis of large intestine without perforation or abscess with bleeding: Secondary | ICD-10-CM | POA: Diagnosis not present

## 2024-03-24 DIAGNOSIS — J9601 Acute respiratory failure with hypoxia: Secondary | ICD-10-CM | POA: Diagnosis not present

## 2024-03-24 DIAGNOSIS — Z9181 History of falling: Secondary | ICD-10-CM | POA: Diagnosis not present

## 2024-03-24 DIAGNOSIS — R262 Difficulty in walking, not elsewhere classified: Secondary | ICD-10-CM | POA: Diagnosis not present

## 2024-03-24 DIAGNOSIS — I4891 Unspecified atrial fibrillation: Secondary | ICD-10-CM | POA: Diagnosis not present

## 2024-03-24 DIAGNOSIS — S32010D Wedge compression fracture of first lumbar vertebra, subsequent encounter for fracture with routine healing: Secondary | ICD-10-CM | POA: Diagnosis not present

## 2024-03-24 DIAGNOSIS — M6281 Muscle weakness (generalized): Secondary | ICD-10-CM | POA: Diagnosis not present

## 2024-03-24 DIAGNOSIS — I10 Essential (primary) hypertension: Secondary | ICD-10-CM | POA: Diagnosis not present

## 2024-03-24 DIAGNOSIS — M5459 Other low back pain: Secondary | ICD-10-CM | POA: Diagnosis not present

## 2024-03-24 DIAGNOSIS — K922 Gastrointestinal hemorrhage, unspecified: Secondary | ICD-10-CM | POA: Diagnosis not present

## 2024-03-24 DIAGNOSIS — E785 Hyperlipidemia, unspecified: Secondary | ICD-10-CM | POA: Diagnosis not present

## 2024-03-24 DIAGNOSIS — E559 Vitamin D deficiency, unspecified: Secondary | ICD-10-CM | POA: Diagnosis not present

## 2024-03-24 DIAGNOSIS — K5900 Constipation, unspecified: Secondary | ICD-10-CM | POA: Diagnosis not present

## 2024-03-25 DIAGNOSIS — I10 Essential (primary) hypertension: Secondary | ICD-10-CM | POA: Diagnosis not present

## 2024-03-25 DIAGNOSIS — I4891 Unspecified atrial fibrillation: Secondary | ICD-10-CM | POA: Diagnosis not present

## 2024-03-25 DIAGNOSIS — J9601 Acute respiratory failure with hypoxia: Secondary | ICD-10-CM | POA: Diagnosis not present

## 2024-03-25 DIAGNOSIS — K5731 Diverticulosis of large intestine without perforation or abscess with bleeding: Secondary | ICD-10-CM | POA: Diagnosis not present

## 2024-03-25 DIAGNOSIS — E785 Hyperlipidemia, unspecified: Secondary | ICD-10-CM | POA: Diagnosis not present

## 2024-03-25 DIAGNOSIS — K922 Gastrointestinal hemorrhage, unspecified: Secondary | ICD-10-CM | POA: Diagnosis not present

## 2024-03-25 DIAGNOSIS — S32010D Wedge compression fracture of first lumbar vertebra, subsequent encounter for fracture with routine healing: Secondary | ICD-10-CM | POA: Diagnosis not present

## 2024-03-25 DIAGNOSIS — K5900 Constipation, unspecified: Secondary | ICD-10-CM | POA: Diagnosis not present

## 2024-03-25 DIAGNOSIS — Z9181 History of falling: Secondary | ICD-10-CM | POA: Diagnosis not present

## 2024-03-25 DIAGNOSIS — E559 Vitamin D deficiency, unspecified: Secondary | ICD-10-CM | POA: Diagnosis not present

## 2024-03-25 DIAGNOSIS — E876 Hypokalemia: Secondary | ICD-10-CM | POA: Diagnosis not present

## 2024-03-26 DIAGNOSIS — E785 Hyperlipidemia, unspecified: Secondary | ICD-10-CM | POA: Diagnosis not present

## 2024-03-26 DIAGNOSIS — I4891 Unspecified atrial fibrillation: Secondary | ICD-10-CM | POA: Diagnosis not present

## 2024-03-26 DIAGNOSIS — K5900 Constipation, unspecified: Secondary | ICD-10-CM | POA: Diagnosis not present

## 2024-03-26 DIAGNOSIS — E876 Hypokalemia: Secondary | ICD-10-CM | POA: Diagnosis not present

## 2024-03-26 DIAGNOSIS — Z9181 History of falling: Secondary | ICD-10-CM | POA: Diagnosis not present

## 2024-03-26 DIAGNOSIS — J9601 Acute respiratory failure with hypoxia: Secondary | ICD-10-CM | POA: Diagnosis not present

## 2024-03-26 DIAGNOSIS — E559 Vitamin D deficiency, unspecified: Secondary | ICD-10-CM | POA: Diagnosis not present

## 2024-03-26 DIAGNOSIS — K5731 Diverticulosis of large intestine without perforation or abscess with bleeding: Secondary | ICD-10-CM | POA: Diagnosis not present

## 2024-03-26 DIAGNOSIS — S32010D Wedge compression fracture of first lumbar vertebra, subsequent encounter for fracture with routine healing: Secondary | ICD-10-CM | POA: Diagnosis not present

## 2024-03-26 DIAGNOSIS — I10 Essential (primary) hypertension: Secondary | ICD-10-CM | POA: Diagnosis not present

## 2024-03-26 DIAGNOSIS — K922 Gastrointestinal hemorrhage, unspecified: Secondary | ICD-10-CM | POA: Diagnosis not present

## 2024-03-27 DIAGNOSIS — E559 Vitamin D deficiency, unspecified: Secondary | ICD-10-CM | POA: Diagnosis not present

## 2024-03-27 DIAGNOSIS — S32010D Wedge compression fracture of first lumbar vertebra, subsequent encounter for fracture with routine healing: Secondary | ICD-10-CM | POA: Diagnosis not present

## 2024-03-27 DIAGNOSIS — K5731 Diverticulosis of large intestine without perforation or abscess with bleeding: Secondary | ICD-10-CM | POA: Diagnosis not present

## 2024-03-27 DIAGNOSIS — E876 Hypokalemia: Secondary | ICD-10-CM | POA: Diagnosis not present

## 2024-03-27 DIAGNOSIS — I10 Essential (primary) hypertension: Secondary | ICD-10-CM | POA: Diagnosis not present

## 2024-03-27 DIAGNOSIS — E785 Hyperlipidemia, unspecified: Secondary | ICD-10-CM | POA: Diagnosis not present

## 2024-03-27 DIAGNOSIS — K5900 Constipation, unspecified: Secondary | ICD-10-CM | POA: Diagnosis not present

## 2024-03-27 DIAGNOSIS — J9601 Acute respiratory failure with hypoxia: Secondary | ICD-10-CM | POA: Diagnosis not present

## 2024-03-27 DIAGNOSIS — K922 Gastrointestinal hemorrhage, unspecified: Secondary | ICD-10-CM | POA: Diagnosis not present

## 2024-03-27 DIAGNOSIS — Z9181 History of falling: Secondary | ICD-10-CM | POA: Diagnosis not present

## 2024-03-27 DIAGNOSIS — I4891 Unspecified atrial fibrillation: Secondary | ICD-10-CM | POA: Diagnosis not present

## 2024-03-29 DIAGNOSIS — E785 Hyperlipidemia, unspecified: Secondary | ICD-10-CM | POA: Diagnosis not present

## 2024-03-29 DIAGNOSIS — I4891 Unspecified atrial fibrillation: Secondary | ICD-10-CM | POA: Diagnosis not present

## 2024-03-29 DIAGNOSIS — K922 Gastrointestinal hemorrhage, unspecified: Secondary | ICD-10-CM | POA: Diagnosis not present

## 2024-03-29 DIAGNOSIS — Z9181 History of falling: Secondary | ICD-10-CM | POA: Diagnosis not present

## 2024-03-29 DIAGNOSIS — K5731 Diverticulosis of large intestine without perforation or abscess with bleeding: Secondary | ICD-10-CM | POA: Diagnosis not present

## 2024-03-29 DIAGNOSIS — K5900 Constipation, unspecified: Secondary | ICD-10-CM | POA: Diagnosis not present

## 2024-03-29 DIAGNOSIS — E876 Hypokalemia: Secondary | ICD-10-CM | POA: Diagnosis not present

## 2024-03-29 DIAGNOSIS — E559 Vitamin D deficiency, unspecified: Secondary | ICD-10-CM | POA: Diagnosis not present

## 2024-03-29 DIAGNOSIS — S32010D Wedge compression fracture of first lumbar vertebra, subsequent encounter for fracture with routine healing: Secondary | ICD-10-CM | POA: Diagnosis not present

## 2024-03-29 DIAGNOSIS — I10 Essential (primary) hypertension: Secondary | ICD-10-CM | POA: Diagnosis not present

## 2024-03-29 DIAGNOSIS — J9601 Acute respiratory failure with hypoxia: Secondary | ICD-10-CM | POA: Diagnosis not present

## 2024-03-30 DIAGNOSIS — J9601 Acute respiratory failure with hypoxia: Secondary | ICD-10-CM | POA: Diagnosis not present

## 2024-03-30 DIAGNOSIS — K922 Gastrointestinal hemorrhage, unspecified: Secondary | ICD-10-CM | POA: Diagnosis not present

## 2024-03-30 DIAGNOSIS — Z9181 History of falling: Secondary | ICD-10-CM | POA: Diagnosis not present

## 2024-03-30 DIAGNOSIS — I10 Essential (primary) hypertension: Secondary | ICD-10-CM | POA: Diagnosis not present

## 2024-03-30 DIAGNOSIS — I4891 Unspecified atrial fibrillation: Secondary | ICD-10-CM | POA: Diagnosis not present

## 2024-03-30 DIAGNOSIS — S32010D Wedge compression fracture of first lumbar vertebra, subsequent encounter for fracture with routine healing: Secondary | ICD-10-CM | POA: Diagnosis not present

## 2024-03-30 DIAGNOSIS — E876 Hypokalemia: Secondary | ICD-10-CM | POA: Diagnosis not present

## 2024-03-30 DIAGNOSIS — K5900 Constipation, unspecified: Secondary | ICD-10-CM | POA: Diagnosis not present

## 2024-03-30 DIAGNOSIS — E785 Hyperlipidemia, unspecified: Secondary | ICD-10-CM | POA: Diagnosis not present

## 2024-03-30 DIAGNOSIS — K5731 Diverticulosis of large intestine without perforation or abscess with bleeding: Secondary | ICD-10-CM | POA: Diagnosis not present

## 2024-03-30 DIAGNOSIS — E559 Vitamin D deficiency, unspecified: Secondary | ICD-10-CM | POA: Diagnosis not present

## 2024-04-01 ENCOUNTER — Telehealth: Payer: Self-pay | Admitting: *Deleted

## 2024-04-01 NOTE — Telephone Encounter (Signed)
 Left voicemail for patient to call to schedule lung cancer screening CT.

## 2024-05-10 ENCOUNTER — Encounter (HOSPITAL_COMMUNITY): Payer: Self-pay

## 2024-05-10 ENCOUNTER — Other Ambulatory Visit: Payer: Self-pay

## 2024-05-10 ENCOUNTER — Emergency Department (HOSPITAL_COMMUNITY)
Admission: EM | Admit: 2024-05-10 | Discharge: 2024-05-10 | Disposition: A | Discharge status: Home / Self Care | Attending: Emergency Medicine | Admitting: Emergency Medicine

## 2024-05-10 ENCOUNTER — Emergency Department (HOSPITAL_COMMUNITY)

## 2024-05-10 DIAGNOSIS — Z91040 Latex allergy status: Secondary | ICD-10-CM | POA: Diagnosis not present

## 2024-05-10 DIAGNOSIS — Z7901 Long term (current) use of anticoagulants: Secondary | ICD-10-CM | POA: Diagnosis not present

## 2024-05-10 DIAGNOSIS — R079 Chest pain, unspecified: Secondary | ICD-10-CM | POA: Diagnosis not present

## 2024-05-10 DIAGNOSIS — E876 Hypokalemia: Secondary | ICD-10-CM | POA: Insufficient documentation

## 2024-05-10 DIAGNOSIS — R072 Precordial pain: Secondary | ICD-10-CM | POA: Diagnosis not present

## 2024-05-10 DIAGNOSIS — R0789 Other chest pain: Secondary | ICD-10-CM | POA: Diagnosis not present

## 2024-05-10 DIAGNOSIS — Z743 Need for continuous supervision: Secondary | ICD-10-CM | POA: Diagnosis not present

## 2024-05-10 LAB — BASIC METABOLIC PANEL WITH GFR
Anion gap: 10 (ref 5–15)
BUN: 9 mg/dL (ref 8–23)
CO2: 29 mmol/L (ref 22–32)
Calcium: 7.3 mg/dL — ABNORMAL LOW (ref 8.9–10.3)
Chloride: 101 mmol/L (ref 98–111)
Creatinine, Ser: 0.88 mg/dL (ref 0.44–1.00)
GFR, Estimated: >60 mL/min (ref >60)
Glucose, Bld: 92 mg/dL (ref 70–99)
Potassium: 2.9 mmol/L — ABNORMAL LOW (ref 3.5–5.1)
Sodium: 140 mmol/L (ref 135–145)

## 2024-05-10 LAB — CBC
HCT: 35.3 % — ABNORMAL LOW (ref 36.0–46.0)
Hemoglobin: 11.1 g/dL — ABNORMAL LOW (ref 12.0–15.0)
MCH: 28.5 pg (ref 26.0–34.0)
MCHC: 31.4 g/dL (ref 30.0–36.0)
MCV: 90.5 fL (ref 80.0–100.0)
Platelets: 139 K/uL — ABNORMAL LOW (ref 150–400)
RBC: 3.9 MIL/uL (ref 3.87–5.11)
RDW: 14.5 % (ref 11.5–15.5)
WBC: 4.6 K/uL (ref 4.0–10.5)
nRBC: 0 % (ref 0.0–0.2)

## 2024-05-10 LAB — TYPE AND SCREEN
ABO/RH(D): O POS
Antibody Screen: NEGATIVE

## 2024-05-10 LAB — TROPONIN I (HIGH SENSITIVITY)
Troponin I (High Sensitivity): 9 ng/L (ref <18)
Troponin I (High Sensitivity): 9 ng/L (ref <18)

## 2024-05-10 LAB — MAGNESIUM: Magnesium: 0.9 mg/dL — CL (ref 1.7–2.4)

## 2024-05-10 MED ORDER — MAGNESIUM OXIDE (ELEMENTAL) 400 MG PO TABS: 1.0000 | ORAL_TABLET | Freq: Every day | ORAL | 0 refills | Status: AC

## 2024-05-10 MED ORDER — POTASSIUM CHLORIDE 10 MEQ/100ML IV SOLN
10.0000 meq | INTRAVENOUS | Status: AC
  Administered 2024-05-10 (×2): 10 meq via INTRAVENOUS
  Filled 2024-05-10 (×2): qty 100

## 2024-05-10 MED ORDER — MAGNESIUM SULFATE 2 GM/50ML IV SOLN
2.0000 g | Freq: Once | INTRAVENOUS | Status: AC
  Administered 2024-05-10: 2 g via INTRAVENOUS
  Filled 2024-05-10: qty 50

## 2024-05-10 MED ORDER — POTASSIUM CHLORIDE 20 MEQ PO PACK
40.0000 meq | PACK | Freq: Two times a day (BID) | ORAL | Status: DC
  Filled 2024-05-10: qty 2

## 2024-05-10 MED ORDER — ACETAMINOPHEN 500 MG PO TABS: 1000.0000 mg | ORAL_TABLET | Freq: Once | ORAL | Status: DC

## 2024-05-10 MED ORDER — POTASSIUM CHLORIDE CRYS ER 20 MEQ PO TBCR
40.0000 meq | EXTENDED_RELEASE_TABLET | Freq: Once | ORAL | Status: AC
  Administered 2024-05-10: 40 meq via ORAL
  Filled 2024-05-10: qty 2

## 2024-05-10 MED ORDER — POTASSIUM CHLORIDE CRYS ER 20 MEQ PO TBCR
EXTENDED_RELEASE_TABLET | ORAL | 0 refills | Status: DC
Start: 2024-05-10 — End: 2024-06-04

## 2024-05-10 NOTE — ED Notes (Signed)
 Patient unable to drive, lives with sister who also unable to drive. Patient wears back brace for recent broken back, is able to walk with cane. Provided patient with cab voucher. Patient A & O x 4.

## 2024-05-10 NOTE — ED Provider Triage Note (Signed)
 Emergency Medicine Provider Triage Evaluation Note  Kristen Wilson , a 73 y.o. female  was evaluated in triage.  Pt complains of sharp localized chest pain lasting 1-2 seconds. No exertional or more prolonged cp. No pleuritic pain. No sob.   Review of Systems  Positive: Cp. Negative: Sob, cough.   Physical Exam  BP (!) 155/84 (BP Location: Right Arm)   Pulse (!) 56   Temp 98.7 F (37.1 C)   Resp 19   SpO2 100%  Gen:   Awake, no distress   Resp:  Normal effort cta bil.  CV:  RRR no rub or murmur MSK:   No leg pain/swelling   Medical Decision Making  Medically screening exam initiated at 4:38 PM.  Appropriate orders placed.  Kristen Wilson was informed that the remainder of the evaluation will be completed by another provider, this initial triage assessment does not replace that evaluation, and the importance of remaining in the ED until their evaluation is complete.     Bernard Drivers, MD 05/10/24 [phone]

## 2024-05-10 NOTE — Discharge Instructions (Addendum)
 It was our pleasure to provide your ER care today - we hope that you feel better.  Drink plenty of fluids/stay well hydrated.   From today's labs, your magnesium  level is low and potassium level low.  Eat plenty of fruits and vegetables, take supplements as prescribed, and follow up with primary care doctor in one week for recheck.   For recent chest pain, follow up with cardiology in the next 1-2 weeks - we made referral, and they should contact you in the next few days with an appointment.   Currently your stool tests negative for blood and your hemoglobin/blood count appears stable - follow up closely with GI doctor in the next  couple weeks.   Return to ER if worse, new symptoms, fevers, recurrent/persistent chest pain, increased trouble breathing, heavy bleeding, fainting, or other concern.

## 2024-05-10 NOTE — ED Triage Notes (Addendum)
 PT arrives via EMS from home. PT reports chest pain that started about 2 hours ago. PT States she also has been experiencing rectal bleeding for the past 2 months. She is on eliquis    EMS administed 500mL NS pta.

## 2024-05-10 NOTE — ED Provider Notes (Signed)
 [place] EMERGENCY DEPARTMENT AT [facility] Provider Note   CSN: [id number] Arrival date & time: 05/10/24  1459     Patient presents with: Chest Pain   Kristen Wilson is a 73 y.o. female.   Pt c/o episodes sharp, localized, anterior chest pain in past day - at rest, non radiating, at rest, without specific exacerbating or alleviating factors. No persistent or pleuritic chest pain. No exertional chest pain. No associated sob, nv or diaphoresis. No unusual doe or fatigue. Denies new or worsening cough or uri symptoms. No fever or chills. Pt also notes intermittent episodes BRBPR. No melena. No faintness. No abd pain.   The history is provided by the patient, medical records and the EMS personnel.  Chest Pain Associated symptoms: no abdominal pain, no back pain, no cough, no fever, no headache, no nausea, no palpitations, no shortness of breath and no vomiting        Prior to Admission medications   Medication Sig Start Date End Date Taking? Authorizing Provider  Magnesium  Oxide, Elemental, 400 MG TABS Take 1 tablet by mouth daily. 05/10/24  Yes Ledora Delker, MD  potassium chloride  SA (KLOR-CON  M) 20 MEQ tablet One po bid x 3 days, then one po once a day 05/10/24  Yes Bernard Drivers, MD  ALPRAZolam  (XANAX ) 0.5 MG tablet Take 0.5 mg by mouth in the morning, at noon, and at bedtime. 12/12/21   [provider]  apixaban  (ELIQUIS ) 5 MG TABS tablet Take 1 tablet (5 mg total) by mouth 2 (two) times daily. Please hold  Eliquis  7 more days Patient taking differently: Take 5 mg by mouth 2 (two) times daily. 08/24/23   Christobal Guadalajara, MD  atorvastatin  (LIPITOR ) 20 MG tablet Take 1 tablet (20 mg total) by mouth daily. Patient taking differently: Take 20 mg by mouth at bedtime. 06/03/23 01/15/24  Tolia, Sunit, DO  dicyclomine  (BENTYL ) 20 MG tablet Take 20 mg by mouth 2 (two) times daily as needed for spasms. 07/09/23   [provider]  DULoxetine  (CYMBALTA ) 60 MG capsule Take  60 mg by mouth in the morning. 01/08/22   [provider]  hydrochlorothiazide  (HYDRODIURIL ) 25 MG tablet Take 25 mg by mouth daily.    [provider]  irbesartan  (AVAPRO ) 300 MG tablet Take 300 mg by mouth daily. 11/18/21   [provider]  metoprolol  tartrate (LOPRESSOR ) 50 MG tablet Take 1 tablet (50 mg total) by mouth 2 (two) times daily. 02/14/22   Ladona Heinz, MD  pantoprazole  (PROTONIX ) 40 MG tablet Take 40 mg by mouth daily before breakfast.    [provider]  TYLENOL  8 HOUR ARTHRITIS PAIN 650 MG CR tablet Take 1,300 mg by mouth in the morning, at noon, and at bedtime.    [provider]    Allergies: Latex, Nsaids, and Percocet [oxycodone -acetaminophen ]    Review of Systems  Constitutional:  Negative for chills and fever.  Respiratory:  Negative for cough and shortness of breath.   Cardiovascular:  Positive for chest pain. Negative for palpitations and leg swelling.  Gastrointestinal:  Negative for abdominal pain, nausea and vomiting.  Genitourinary:  Negative for dysuria, flank pain and hematuria.  Musculoskeletal:  Negative for back pain and neck pain.  Neurological:  Negative for syncope and headaches.    Updated Vital Signs BP (!) 178/88   Pulse 78   Temp 97.8 F (36.6 C) (Oral)   Resp 15   Ht 1.524 m (5')   Wt 63.5 kg  SpO2 92%   BMI 27.34 kg/m   Physical Exam Vitals and nursing note reviewed.  Constitutional:      Appearance: Normal appearance. She is well-developed.  HENT:     Head: Atraumatic.     Nose: Nose normal.     Mouth/Throat:     Mouth: Mucous membranes are moist.  Eyes:     General: No scleral icterus.    Conjunctiva/sclera: Conjunctivae normal.  Neck:     Trachea: No tracheal deviation.  Cardiovascular:     Rate and Rhythm: Normal rate and regular rhythm.     Pulses: Normal pulses.     Heart sounds: Normal heart sounds. No murmur heard.    No friction rub. No gallop.  Pulmonary:     Effort:  Pulmonary effort is normal. No respiratory distress.     Breath sounds: Normal breath sounds.     Comments: Notes localized to few cm area of sharp pain lasting 1-2 seconds to area of left chest - no skin changes or sts to area.  Chest:     Chest wall: No tenderness.  Abdominal:     General: Bowel sounds are normal. There is no distension.     Palpations: Abdomen is soft. There is no mass.     Tenderness: There is no abdominal tenderness.  Genitourinary:    Comments: No cva tenderness. Rectal, old hemorrhoids, no acutely thrombosed and/or bleeding hemorrhoids. No mass felt. Light brown stool is heme neg.  Musculoskeletal:        General: No swelling or tenderness.     Cervical back: Normal range of motion and neck supple. No rigidity. No muscular tenderness.     Right lower leg: No edema.     Left lower leg: No edema.  Skin:    General: Skin is warm and dry.     Findings: No rash.  Neurological:     Mental Status: She is alert.     Comments: Alert, speech normal.   Psychiatric:        Mood and Affect: Mood normal.     (all labs ordered are listed, but only abnormal results are displayed) Results for orders placed or performed during the hospital encounter of 05/10/24  Basic metabolic panel   Collection Time: 05/10/24  3:03 PM  Result Value Ref Range   Sodium 140 135 - 145 mmol/L   Potassium 2.9 (L) 3.5 - 5.1 mmol/L   Chloride 101 98 - 111 mmol/L   CO2 29 22 - 32 mmol/L   Glucose, Bld 92 70 - 99 mg/dL   BUN 9 8 - 23 mg/dL   Creatinine, Ser 9.11 0.44 - 1.00 mg/dL   Calcium  7.3 (L) 8.9 - 10.3 mg/dL   GFR, Estimated >39 >39 mL/min   Anion gap 10 5 - 15  CBC   Collection Time: 05/10/24  3:03 PM  Result Value Ref Range   WBC 4.6 4.0 - 10.5 K/uL   RBC 3.90 3.87 - 5.11 MIL/uL   Hemoglobin 11.1 (L) 12.0 - 15.0 g/dL   HCT 64.6 (L) 63.9 - 53.9 %   MCV 90.5 80.0 - 100.0 fL   MCH 28.5 26.0 - 34.0 pg   MCHC 31.4 30.0 - 36.0 g/dL   RDW 85.4 88.4 - 84.4 %   Platelets 139 (L) 150  - 400 K/uL   nRBC 0.0 0.0 - 0.2 %  Troponin I (High Sensitivity)   Collection Time: 05/10/24  3:03 PM  Result Value Ref Range  Troponin I (High Sensitivity) 9 <18 ng/L  Type and screen [facility]   Collection Time: 05/10/24  3:25 PM  Result Value Ref Range   ABO/RH(D) O POS    Antibody Screen NEG    Sample Expiration      05/13/2024,2359 Performed at [facility], [address], [place], [place] [address]   Magnesium    Collection Time: 05/10/24  5:03 PM  Result Value Ref Range   Magnesium  0.9 (LL) 1.7 - 2.4 mg/dL  Troponin I (High Sensitivity)   Collection Time: 05/10/24  5:03 PM  Result Value Ref Range   Troponin I (High Sensitivity) 9 <18 ng/L   DG Chest 2 View Result Date: 05/10/2024 CLINICAL DATA:  Chest pain EXAM: CHEST - 2 VIEW COMPARISON:  Chest x-ray 02/13/2022 FINDINGS: The heart size and mediastinal contours are within normal limits. Both lungs are clear. The visualized skeletal structures are unremarkable. IMPRESSION: No active cardiopulmonary disease. Electronically Signed   By: Greig Pique M.D.   On: 05/10/2024 16:50     EKG: EKG Interpretation Date/Time:  Sunday May 10 2024 15:07:37 EDT Ventricular Rate:  58 PR Interval:  144 QRS Duration:  80 QT Interval:  490 QTC Calculation: 481 R Axis:   42  Text Interpretation: Sinus bradycardia Nonspecific T wave abnormality Confirmed by Bernard Drivers ([id number]) on 05/10/2024 4:33:06 PM  Radiology: [facility] Chest 2 View Result Date: 05/10/2024 CLINICAL DATA:  Chest pain EXAM: CHEST - 2 VIEW COMPARISON:  Chest x-ray 02/13/2022 FINDINGS: The heart size and mediastinal contours are within normal limits. Both lungs are clear. The visualized skeletal structures are unremarkable. IMPRESSION: No active cardiopulmonary disease. Electronically Signed   By: Greig Pique M.D.   On: 05/10/2024 16:50     Procedures   Medications Ordered in the ED  acetaminophen  (TYLENOL ) tablet 1,000 mg (1,000 mg Oral  Patient Refused/Not Given 05/10/24 1707)  magnesium  sulfate IVPB 2 g 50 mL (0 g Intravenous Stopped 05/10/24 1945)  potassium chloride  10 mEq in 100 mL IVPB (0 mEq Intravenous Stopped 05/10/24 2129)  potassium chloride  SA (KLOR-CON  M) CR tablet 40 mEq (40 mEq Oral Given 05/10/24 2143)                                    Medical Decision Making Problems Addressed: Atypical chest pain: acute illness or injury Hypokalemia: acute illness or injury Hypomagnesemia: acute illness or injury Precordial chest pain: acute illness or injury with systemic symptoms that poses a threat to life or bodily functions  Amount and/or Complexity of Data Reviewed Independent Historian: EMS    Details: hx External Data Reviewed: notes. Labs: ordered. Decision-making details documented in ED Course. Radiology: ordered and independent interpretation performed. Decision-making details documented in ED Course. ECG/medicine tests: ordered and independent interpretation performed. Decision-making details documented in ED Course.  Risk OTC drugs. Prescription drug management. Decision regarding hospitalization.   Iv ns. Continuous pulse ox and cardiac monitoring. Labs ordered/sent. Imaging ordered.   Differential diagnosis includes acs, msk cp, gi cp, etc. Dispo decision including potential need for admission considered - will get labs and imaging and reassess.   Reviewed nursing notes and prior charts for additional history. External reports reviewed. Additional history from: EMS.   Cardiac monitor: sinus rhythm, rate 60.  Labs reviewed/interpreted by me - wbc normal. Hgb 11 c/w prior. Plt mildly low. Trop normal. Mg low. Mg iv. K low,  kcl iv and po.   Additional labs reviewed/interpreted by me - delta trop normal, not significant increasing - felt not c/w acs.   Hgb is c/w baseline.   Xrays reviewed/interpreted by me - no pna.   Pt without current cp or sob, lab workup reassuring. Pt currently appears  stable for ed d/c.   Rec close pcp f/u.  Return precautions provided.       Final diagnoses:  Precordial chest pain  Atypical chest pain  Hypomagnesemia  Hypokalemia    ED Discharge Orders          Ordered    Magnesium  Oxide, Elemental, 400 MG TABS  Daily        05/10/24 2130    potassium chloride  SA (KLOR-CON  M) 20 MEQ tablet        05/10/24 2130    Ambulatory referral to Cardiology       Comments: If you have not heard from the Cardiology office within the next 72 hours please call [phone].   05/10/24 2131               Bernard Drivers, MD 05/10/24 2228

## 2024-05-19 DIAGNOSIS — S32010A Wedge compression fracture of first lumbar vertebra, initial encounter for closed fracture: Secondary | ICD-10-CM | POA: Diagnosis not present

## 2024-05-20 ENCOUNTER — Telehealth: Payer: Self-pay

## 2024-05-20 NOTE — Telephone Encounter (Signed)
 Pharmacy please advise on holding Apixaban  prior to L kyptoplasty  scheduled for TBD. Last labs (CBC and BMET) on 05/10/2024. Thank you.

## 2024-05-20 NOTE — Telephone Encounter (Signed)
   Pre-operative Risk Assessment    Patient Name: Kristen Wilson  DOB: 1951/05/21 MRN: [id number]   Date of last office visit: 06/03/23 Date of next office visit:    Request for Surgical Clearance    Procedure:  L kyptoplasty   Date of Surgery:  Clearance TBD                                 Surgeon:  Jayson MYRTIS Rhein, MD  Surgeon's Group or Practice Name:  [facility]  Phone number:  [phone] Fax number:  [phone]   Type of Clearance Requested:   - Medical  - Pharmacy:  Hold Apixaban  (Eliquis ) not indicated    Type of Anesthesia:  Not Indicated   Additional requests/questions:    [name]   05/20/2024, 12:29 PM

## 2024-05-21 DIAGNOSIS — S32010G Wedge compression fracture of first lumbar vertebra, subsequent encounter for fracture with delayed healing: Secondary | ICD-10-CM | POA: Diagnosis not present

## 2024-05-21 DIAGNOSIS — J432 Centrilobular emphysema: Secondary | ICD-10-CM | POA: Diagnosis not present

## 2024-05-21 DIAGNOSIS — I10 Essential (primary) hypertension: Secondary | ICD-10-CM | POA: Diagnosis not present

## 2024-05-21 DIAGNOSIS — K625 Hemorrhage of anus and rectum: Secondary | ICD-10-CM | POA: Diagnosis not present

## 2024-05-21 DIAGNOSIS — D649 Anemia, unspecified: Secondary | ICD-10-CM | POA: Diagnosis not present

## 2024-05-21 DIAGNOSIS — Z23 Encounter for immunization: Secondary | ICD-10-CM | POA: Diagnosis not present

## 2024-05-21 DIAGNOSIS — E782 Mixed hyperlipidemia: Secondary | ICD-10-CM | POA: Diagnosis not present

## 2024-05-21 DIAGNOSIS — E876 Hypokalemia: Secondary | ICD-10-CM | POA: Diagnosis not present

## 2024-05-21 DIAGNOSIS — I2510 Atherosclerotic heart disease of native coronary artery without angina pectoris: Secondary | ICD-10-CM | POA: Diagnosis not present

## 2024-05-22 ENCOUNTER — Telehealth: Payer: Self-pay | Admitting: Adult Health

## 2024-05-22 NOTE — Telephone Encounter (Signed)
   Name: Kristen Wilson  DOB: 1950/09/01  MRN: [id number]  Primary Cardiologist: Madonna Large, DO  Chart reviewed as part of pre-operative protocol coverage. The patient has an upcoming visit scheduled with Dr. Large on 05/25/2024 at which time clearance can be addressed in case there are any issues that would impact surgical recommendations.  L kyphoplasty is not scheduled until TBD as below. I added preop FYI to appointment note so that provider is aware to address at time of outpatient visit.  Per office protocol the cardiology provider should forward their finalized clearance decision and recommendations regarding antiplatelet therapy to the requesting party below.    This message will also be routed to pharmacy pool and/or Dr Large for input on holding Eliquis  as requested below so that this information is available to the clearing provider at time of patient's appointment.   I will route this message as FYI to requesting party and remove this message from the preop box as separate preop APP input not needed at this time.   Please call with any questions.  Lamarr Satterfield, NP  05/22/2024, 4:48 PM

## 2024-05-22 NOTE — Telephone Encounter (Signed)
 Pt has been scheduled with Dr. Michele 05/25/24 @ 11:40.

## 2024-05-22 NOTE — Telephone Encounter (Signed)
 Jerilynn Lamarr HERO, NP    05/22/24  4:50 PM Note    Name: Kristen Wilson  DOB: April 14, 1951  MRN: [id number]   Primary Cardiologist: Madonna Large, DO   Chart reviewed as part of pre-operative protocol coverage. The patient has an upcoming visit scheduled with Dr. Large on 05/25/2024 at which time clearance can be addressed in case there are any issues that would impact surgical recommendations.  L kyphoplasty is not scheduled until TBD as below. I added preop FYI to appointment note so that provider is aware to address at time of outpatient visit.  Per office protocol the cardiology provider should forward their finalized clearance decision and recommendations regarding antiplatelet therapy to the requesting party below.     This message will also be routed to pharmacy pool and/or Dr Large for input on holding Eliquis  as requested below so that this information is available to the clearing provider at time of patient's appointment.    I will route this message as FYI to requesting party and remove this message from the preop box as separate preop APP input not needed at this time.    Please call with any questions.   Lamarr Jerilynn, NP  05/22/2024, 4:48 PM

## 2024-05-22 NOTE — Telephone Encounter (Signed)
 Patient returned Pre-op call.

## 2024-05-25 ENCOUNTER — Encounter: Payer: Self-pay | Admitting: Cardiology

## 2024-05-25 ENCOUNTER — Ambulatory Visit: Attending: Cardiology | Admitting: Cardiology

## 2024-05-25 VITALS — BP 112/73 | HR 66 | Resp 16 | Ht 60.0 in | Wt 142.0 lb

## 2024-05-25 DIAGNOSIS — R9431 Abnormal electrocardiogram [ECG] [EKG]: Secondary | ICD-10-CM | POA: Diagnosis not present

## 2024-05-25 DIAGNOSIS — Z01818 Encounter for other preprocedural examination: Secondary | ICD-10-CM | POA: Diagnosis not present

## 2024-05-25 DIAGNOSIS — I480 Paroxysmal atrial fibrillation: Secondary | ICD-10-CM

## 2024-05-25 DIAGNOSIS — I10 Essential (primary) hypertension: Secondary | ICD-10-CM

## 2024-05-25 DIAGNOSIS — Z87891 Personal history of nicotine dependence: Secondary | ICD-10-CM

## 2024-05-25 DIAGNOSIS — Z7901 Long term (current) use of anticoagulants: Secondary | ICD-10-CM

## 2024-05-25 DIAGNOSIS — R072 Precordial pain: Secondary | ICD-10-CM | POA: Diagnosis not present

## 2024-05-25 DIAGNOSIS — I2510 Atherosclerotic heart disease of native coronary artery without angina pectoris: Secondary | ICD-10-CM

## 2024-05-25 DIAGNOSIS — E7800 Pure hypercholesterolemia, unspecified: Secondary | ICD-10-CM

## 2024-05-25 DIAGNOSIS — I2584 Coronary atherosclerosis due to calcified coronary lesion: Secondary | ICD-10-CM

## 2024-05-25 NOTE — Patient Instructions (Signed)
 Medication Instructions:  Your physician recommends that you continue on your current medications as directed. Please refer to the Current Medication list given to you today.  *If you need a refill on your cardiac medications before your next appointment, please call your pharmacy*  Lab Work: NONE  If you have labs (blood work) drawn today and your tests are completely normal, you will receive your results only by: MyChart Message (if you have MyChart) OR A paper copy in the mail If you have any lab test that is abnormal or we need to change your treatment, we will call you to review the results.  Testing/Procedures: STAT- - - Your physician has requested that you have an echocardiogram. Echocardiography is a painless test that uses sound waves to create images of your heart. It provides your doctor with information about the size and shape of your heart and how well your heart's chambers and valves are working. This procedure takes approximately one hour. There are no restrictions for this procedure. Please do NOT wear cologne, perfume, aftershave, or lotions (deodorant is allowed). Please arrive 15 minutes prior to your appointment time.  Please note: We ask at that you not bring children with you during ultrasound (echo/ vascular) testing. Due to room size and safety concerns, children are not allowed in the ultrasound rooms during exams. Our front office staff cannot provide observation of children in our lobby area while testing is being conducted. An adult accompanying a patient to their appointment will only be allowed in the ultrasound room at the discretion of the ultrasound technician under special circumstances. We apologize for any inconvenience.  STAT- - - Your physician has requested that you have a lexiscan  myoview . For further information please visit [email or web address] Please follow instructions.   Please arrive 15 minutes prior to your appointment time for registration and  insurance purposes.   The test will take approximately 3 to 4 hours to complete; you may bring reading material.  If someone comes with you to your appointment, they will need to remain in the main lobby due to limited space in the testing area. **If you are pregnant or breastfeeding, please notify the nuclear lab prior to your appointment**   How to prepare for your Myocardial Perfusion Test: Do not eat or drink 3 hours prior to your test, except you may have water. Do not consume products containing caffeine (regular or decaffeinated) 12 hours prior to your test. (ex: coffee, chocolate, sodas, tea). Do bring a list of your current medications with you.  If not listed below, you may take your medications as normal. Do wear comfortable clothes (no dresses or overalls) and walking shoes, tennis shoes preferred (No heels or open toe shoes are allowed). Do NOT wear cologne, perfume, aftershave, or lotions (deodorant is allowed). If these instructions are not followed, your test will have to be rescheduled.  If you cannot keep your appointment, please provide 24 hours notification to the Nuclear Lab, to avoid a possible $50 charge to your account.     Follow-Up: At [facility], you and your health needs are our priority.  As part of our continuing mission to provide you with exceptional heart care, our providers are all part of one team.  This team includes your primary Cardiologist (physician) and Advanced Practice Providers or APPs (Physician Assistants and Nurse Practitioners) who all work together to provide you with the care you need, when you need it.  Your next appointment:   6 month(s)  Provider:  Sunit Tolia, DO    We recommend signing up for the patient portal called MyChart.  Sign up information is provided on this After Visit Summary.  MyChart is used to connect with patients for Virtual Visits (Telemedicine).  Patients are able to view lab/test results, encounter notes,  upcoming appointments, etc.  Non-urgent messages can be sent to your provider as well.   To learn more about what you can do with MyChart, go to [email or web address].

## 2024-05-25 NOTE — Progress Notes (Signed)
 Cardiology Office Note:  .   Date:  05/25/2024  ID:  Kristen Wilson, DOB 12-13-1950, MRN [id number] PCP:  Aisha Harvey, MD  Former Cardiology Providers: Dr. Gordy Bergamo, Sherran Berliner, PA  [facility] Providers Cardiologist:  Madonna Large, DO , [facility] (established care 02/14/2022 ) Electrophysiologist:  None  Click to update primary MD,subspecialty MD or APP then REFRESH:1}    History of Present Illness: Kristen   Daneen Volcy Wilson is a 73 y.o. Caucasian female whose past medical history and cardiovascular risk factors includes: Paroxysmal atrial fibrillation, moderate nonobstructive CAD, hypertension, hyperlipidemia, GERD, IBS, chronic palpitations, former smoker (quit in August 2023), postmenopausal female.  Patient is being followed by the practice for her paroxysmal atrial fibrillation and nonobstructive CAD.  Patient is referred to the practice for reevaluation of preoperative risk stratification prior to a lumbar kyphoplasty with Dr. Reyne at [facility].   Patient endorses chest pain. Occurs once every 2 to 3 months. Last episode 2 months ago Left-sided. Occurs randomly and intermittent. Pressure/sharp. Overall functional capacity is limited due to back pain.  No significant pain with her activities of daily living.  She does not overexert. Better with resting and relaxing.  Has gone to the ED for evaluation when these episodes occur.   Review of Systems: .   Review of Systems  Cardiovascular:  Positive for chest pain. Negative for claudication, dyspnea on exertion, irregular heartbeat, leg swelling, near-syncope, orthopnea, palpitations, paroxysmal nocturnal dyspnea and syncope.  Respiratory:  Negative for shortness of breath.   Hematologic/Lymphatic: Negative for bleeding problem.  Musculoskeletal:  Negative for muscle cramps and myalgias.  Neurological:  Negative for dizziness and light-headedness.    Studies Reviewed:   EKG: EKG  Interpretation Date/Time:  Monday May 25 2024 11:55:53 EDT Ventricular Rate:  63 PR Interval:  132 QRS Duration:  76 QT Interval:  416 QTC Calculation: 425 R Axis:   19  Text Interpretation: Normal sinus rhythm ST & T wave abnormality, consider inferolateral ischemia When compared with ECG of 10-May-2024 15:07, ST depression in Inferolateral leads are new since 06/23/2023 Confirmed by Large Madonna [phone]) on 05/25/2024 12:05:19 PM   Echocardiogram: 11/12/2022:  Left ventricle cavity is normal in size and wall thickness. Normal global wall motion. Normal LV systolic function with EF 65%. Normal diastolic filling pattern.  Aneurysmal interatrial septum without 2D or color Doppler evidence of shunting.  Structurally normal trileaflet aortic valve.  Mild (Grade I) aortic regurgitation.  No evidence of pulmonary hypertension.  No significant change compared to previous study on 03/21/2022.    Stress Testing: Lexiscan  Tetrofosmin  stress test 11/05/2022: 1 Day Rest/Stress protocol.  Stress EKG is non-diagnostic baseline ST-T changes and target heart rate not achieved. Normal myocardial perfusion without reversible ischemia or prior infarct. Calculated LVEF 68%, wall motion preserved, without regional wall motion abnormalities. No prior studies available for comparison. Low risk study.   Heart Catheterization: Left Heart Catheterization 02/14/22:  LV: 132/6, EDP 19 mmHg.  Ao 164/71, mean 108 mmHg.  No pressure gradient across the aortic valve. LVEF normal at 55 to 60%.  No significant mitral regurgitation. LM: Large vessel, mild calcification evident. LAD: Large vessel, moderate diffuse coronary calcifications noted throughout the proximal and mid segment of the LAD.  There is a diffuse 30% calcific stenosis in the mid LAD after the origin of septal perforator 1.  D1 is large and has a ostial 50% stenosis.  Otherwise the LAD has mild diffuse disease distally. CX: Large vessel, has mild  to moderate amount of coronary calcification in the proximal and mid segment.  There is mild diffuse disease, small AV groove circumflex, large OM1 with mild disease. RCA: Large-caliber vessel.  Again mild proximal coronary calcification is evident.  Mild luminal irregularities present.  Impression: The troponin elevation is related to demand ischemia and in the absence of high-grade coronary stenosis, normal LVEF and normal EKG, do not suspect non-ST elevation myocardial infarction.   ABI 03/21/2022:  This exam reveals normal perfusion of the right lower extremity (ABI 1.15) with mildly abnormal biphasic waveform at the ankle.    This exam reveals normal perfusion of the left lower extremity (ABI 1.20) with mildly abnormal biphasic waveform at the DP and severely abnormal monophasic waveform at the PT at the level of the ankle.   Risk Assessment/Calculations:   Click Here to Calculate/Change CHADS2VASc Score The patient's CHADS2-VASc score is 4, indicating a 4.8% annual risk of stroke.   CHF History: No HTN History: Yes Diabetes History: No Stroke History: No Vascular Disease History: Yes  Labs:       Latest Ref Rng & Units 05/10/2024    3:03 PM 01/16/2024    5:15 PM 01/16/2024   11:07 AM  CBC  WBC 4.0 - 10.5 K/uL 4.6   4.6   Hemoglobin 12.0 - 15.0 g/dL 88.8  89.0  89.2   Hematocrit 36.0 - 46.0 % 35.3  34.7  34.7   Platelets 150 - 400 K/uL 139   143        Latest Ref Rng & Units 05/10/2024    3:03 PM 01/18/2024    6:18 AM 01/17/2024    6:20 PM  BMP  Glucose 70 - 99 mg/dL 92  99    BUN 8 - 23 mg/dL 9  14    Creatinine 9.55 - 1.00 mg/dL 9.11  9.16    Sodium 864 - 145 mmol/L 140  138    Potassium 3.5 - 5.1 mmol/L 2.9  3.9  4.1   Chloride 98 - 111 mmol/L 101  104    CO2 22 - 32 mmol/L 29  30    Calcium  8.9 - 10.3 mg/dL 7.3  8.9        Latest Ref Rng & Units 05/10/2024    3:03 PM 01/18/2024    6:18 AM 01/17/2024    6:20 PM  CMP  Glucose 70 - 99 mg/dL 92  99    BUN 8 - 23  mg/dL 9  14    Creatinine 9.55 - 1.00 mg/dL 9.11  9.16    Sodium 864 - 145 mmol/L 140  138    Potassium 3.5 - 5.1 mmol/L 2.9  3.9  4.1   Chloride 98 - 111 mmol/L 101  104    CO2 22 - 32 mmol/L 29  30    Calcium  8.9 - 10.3 mg/dL 7.3  8.9      Lab Results  Component Value Date   CHOL 78 02/14/2022   HDL 32 (L) 02/14/2022   LDLCALC 33 02/14/2022   TRIG 67 02/14/2022   CHOLHDL 2.4 02/14/2022   No results for input(s): LIPOA in the last 8760 hours. No components found for: NTPROBNP No results for input(s): PROBNP in the last 8760 hours. No results for input(s): TSH in the last 8760 hours.  External Labs: Collected: May 29, 2023: Total cholesterol 180, triglycerides 146, HDL 32, LDL calculated 122  Collected: July 08, 2023 provided by PCP. Total cholesterol 157, triglycerides 219, HDL 33,  calculated LDL 87, non-HDL 124. Hemoglobin 13.1.   Physical Exam:    Today's Vitals   05/25/24 1154  BP: 112/73  Pulse: 66  Resp: 16  SpO2: 95%  Weight: 142 lb (64.4 kg)  Height: 5' (1.524 m)   Body mass index is 27.73 kg/m. Wt Readings from Last 3 Encounters:  05/25/24 142 lb (64.4 kg)  05/10/24 140 lb (63.5 kg)  01/16/24 144 lb 1.6 oz (65.4 kg)    Physical Exam  Constitutional: No distress. She appears chronically ill.  hemodynamically stable, walks w/ cane  Neck: No JVD present.  Cardiovascular: Normal rate, regular rhythm, S1 normal, S2 normal and intact distal pulses. Exam reveals no gallop, no S3 and no S4.  Murmur heard. Systolic murmur is present with a grade of 3/6 at the lower left sternal border. Pulses:      Dorsalis pedis pulses are 1+ on the right side and 1+ on the left side.       Posterior tibial pulses are 0 on the right side and 0 on the left side.  Pulmonary/Chest: Effort normal. No stridor. She has no wheezes. She has no rales.  Decreased breath sounds  Abdominal: Soft. Bowel sounds are normal. She exhibits no distension. There is no  abdominal tenderness.  Musculoskeletal:        General: No edema.     Cervical back: Neck supple.     Comments: Wearing a thoracic/lumbar brace  Neurological: She is alert and oriented to person, place, and time. She has intact cranial nerves (2-12).  Skin: Skin is warm and dry.     Impression & Recommendation(s):  Impression:   ICD-10-CM   1. Precordial pain  R07.2 ECHOCARDIOGRAM COMPLETE    MYOCARDIAL PERFUSION IMAGING    CANCELED: EXERCISE TOLERANCE TEST (ETT)    2. Abnormal EKG  R94.31 ECHOCARDIOGRAM COMPLETE    Cardiac Stress Test: Informed Consent Details: Physician/Practitioner Attestation; Transcribe to consent form and obtain patient signature    CANCELED: EXERCISE TOLERANCE TEST (ETT)    3. Nonobstructive atherosclerosis of coronary artery  I25.10     4. Coronary atherosclerosis due to calcified coronary lesion  I25.10 Cardiac Stress Test: Informed Consent Details: Physician/Practitioner Attestation; Transcribe to consent form and obtain patient signature   I25.84     5. Preoperative clearance  Z01.818 EKG 12-Lead    ECHOCARDIOGRAM COMPLETE    Cardiac Stress Test: Informed Consent Details: Physician/Practitioner Attestation; Transcribe to consent form and obtain patient signature    CANCELED: EXERCISE TOLERANCE TEST (ETT)    6. Paroxysmal atrial fibrillation (HCC)  I48.0     7. Long term (current) use of anticoagulants  Z79.01     8. Benign hypertension  I10     9. Pure hypercholesterolemia  E78.00     10. Former smoker  Z87.891        Recommendation(s):  Precordial pain Abnormal EKG Nonobstructive atherosclerosis of coronary artery Coronary atherosclerosis due to calcified coronary lesion Precordial pain has mixed features. Unable to truly comment on exertional discomfort given her limited functional capacity EKG shows T wave inversions in inferolateral leads suggestive of ischemia. Has gone to the ED for chest pain evaluation since last visit. Overall  functional capacity is limited due to back pain. Given her precordial pain, abnormal EKG, and recent ED visits for chest pain evaluation recommend echo and stress test prior to her upcoming noncardiac surgery Risks and benefits, and alternatives discussed. Educated her on seeking medical attention sooner by going to the closest  ER via EMS if the symptoms increase in intensity, frequency, duration, or has typical chest pain as discussed in the office.  Patient verbalized understanding.  Preoperative clearance Being considered for lumbar kyphoplasty with Dr. Reyne at [facility].  To be determined Given her chest pain and abnormal EKG recommend ischemic workup as outlined above.  However, if her surgery is deemed to be emergent with regards to life saving or limb saving can proceed forward with surgery.  But if it is considered elective would recommend ischemic workup prior to surgical intervention Patient verbalizes understanding and states that is nonemergent surgery  Paroxysmal atrial fibrillation (HCC) Long term (current) use of anticoagulants Rate control: Lopressor  50 mg p.o. twice daily. Rhythm control: N/A. Thromboembolic prophylaxis: Eliquis . CHA2DS2-VASc score as discussed above. Does not endorse evidence of bleeding.  Benign hypertension Office blood pressures are at goal. Continue hydrochlorothiazide  25 mg p.o. daily. Continue Avapro  300 mg p.o. daily. Continue Lopressor  50 mg p.o. twice daily No changes warranted at this time.  Pure hypercholesterolemia Currently on atorvastatin  20 mg p.o. daily She denies myalgia or other side effects.  Orders Placed:  Orders Placed This Encounter  Procedures   Cardiac Stress Test: Informed Consent Details: Physician/Practitioner Attestation; Transcribe to consent form and obtain patient signature    Physician/Practitioner attestation of informed consent for procedure/surgical case:   I, the physician/practitioner, attest that I have  discussed with the patient the benefits, risks, side effects, alternatives, likelihood of achieving goals and potential problems during recovery for the procedure that I have provided informed consent.    Procedure:   Lexiscan  stress test    Indication/Reason:   Precordial pain, abnormal EKG, preop   MYOCARDIAL PERFUSION IMAGING    Standing Status:   Future    Expiration Date:   05/25/2025    Where [facility] this be performed[facility]    Type of stress:   Lexiscan    EKG 12-Lead   ECHOCARDIOGRAM COMPLETE    Standing Status:   Future    Expiration Date:   05/25/2025    Where should this test be performed:   [facility]    Does the patient weigh less than or greater than 250 lbs?:   Patient weighs less than 250 lbs    Perflutren DEFINITY (image enhancing agent) should be administered unless hypersensitivity or allergy exist:   Administer Perflutren    Reason for exam-Echo:   Other-Full Diagnosis List    Full ICD-10/Reason for Exam:   Chest pain [744799]    Full ICD-10/Reason for Exam:   Pre-op evaluation [690191]    Full ICD-10/Reason for Exam:   Abnormal EKG [276271]    Final Medication List:    No orders of the defined types were placed in this encounter.   There are no discontinued medications.    Current Outpatient Medications:    ALPRAZolam  (XANAX ) 0.5 MG tablet, Take 0.5 mg by mouth in the morning, at noon, and at bedtime., Disp: , Rfl:    apixaban  (ELIQUIS ) 5 MG TABS tablet, Take 1 tablet (5 mg total) by mouth 2 (two) times daily. Please hold  Eliquis  7 more days (Patient taking differently: Take 5 mg by mouth 2 (two) times daily.), Disp: , Rfl:    atorvastatin  (LIPITOR ) 20 MG tablet, Take 1 tablet (20 mg total) by mouth daily. (Patient taking differently: Take 20 mg by mouth at bedtime.), Disp: 90 tablet, Rfl: 3   dicyclomine  (BENTYL ) 20 MG tablet, Take 20  mg by mouth 2 (two) times daily as needed for spasms., Disp: , Rfl:    DULoxetine  (CYMBALTA ) 60 MG  capsule, Take 60 mg by mouth in the morning., Disp: , Rfl:    [Paused] hydrochlorothiazide  (HYDRODIURIL ) 25 MG tablet, Take 25 mg by mouth daily., Disp: , Rfl:    irbesartan  (AVAPRO ) 300 MG tablet, Take 300 mg by mouth daily., Disp: , Rfl:    Magnesium  Oxide, Elemental, 400 MG TABS, Take 1 tablet by mouth daily., Disp: 30 tablet, Rfl: 0   metoprolol  tartrate (LOPRESSOR ) 50 MG tablet, Take 1 tablet (50 mg total) by mouth 2 (two) times daily., Disp: 60 tablet, Rfl: 1   pantoprazole  (PROTONIX ) 40 MG tablet, Take 40 mg by mouth daily before breakfast., Disp: , Rfl:    potassium chloride  SA (KLOR-CON  M) 20 MEQ tablet, One po bid x 3 days, then one po once a day, Disp: 20 tablet, Rfl: 0   TYLENOL  8 HOUR ARTHRITIS PAIN 650 MG CR tablet, Take 1,300 mg by mouth in the morning, at noon, and at bedtime., Disp: , Rfl:   Consent:      Informed Consent   Shared Decision Making/Informed Consent The risks [chest pain, shortness of breath, cardiac arrhythmias, dizziness, blood pressure fluctuations, myocardial infarction, stroke/transient ischemic attack, nausea, vomiting, allergic reaction, radiation exposure, metallic taste sensation and life-threatening complications (estimated to be 1 in 10,000)], benefits (risk stratification, diagnosing coronary artery disease, treatment guidance) and alternatives of a nuclear stress test were discussed in detail with Ms. Viall and she agrees to proceed.     Disposition:   6 months or sooner if needed  Her questions and concerns were addressed to her satisfaction. She voices understanding of the recommendations provided during this encounter.    Signed, Madonna Michele HAS, [facility]  A Division of [facility] [address], [place], [place] [address]   05/25/2024 12:49 PM

## 2024-05-27 NOTE — Telephone Encounter (Signed)
 Patient with diagnosis of atrial fibrillation on Eliquis  for anticoagulation.    Procedure:  L kyptoplasty    Date of Surgery:  Clearance TBD    CHA2DS2-VASc Score = 4   This indicates a 4.8% annual risk of stroke. The patient's score is based upon: CHF History: 0 HTN History: 1 Diabetes History: 0 Stroke History: 0 Vascular Disease History: 1 Age Score: 1 Gender Score: 1   CrCl 56 Platelet count 139  Patient has not had an Afib/aflutter ablation or Watchman within the last 3 months or DCCV within the last 30 days   Per office protocol, patient can hold Eliquis  for 3 days prior to procedure.   Patient will not need bridging with Lovenox  (enoxaparin ) around procedure.  **This guidance is not considered finalized until pre-operative APP has relayed final recommendations.**

## 2024-05-28 NOTE — Telephone Encounter (Signed)
 I s/w Kristen Wilson and reviewed the notes from Dr. Michele: Preoperative clearance Being considered for lumbar kyph[facility] with Dr. Reyne at [facility].  To be determined Given her chest pain and abnormal EKG recommend ischemic workup as outlined above.  However, if her surgery is deemed to be emergent with regards to life saving or limb saving can proceed forward with surgery.  But if it is considered elective would recommend ischemic workup prior to surgical intervention Patient verbalizes understanding and states that is nonemergent surgery    I assured Kristen Wilson that I will fax the ov notes from Dr. Michele to her today. See recommendations from Dr. Michele please.

## 2024-05-28 NOTE — Telephone Encounter (Signed)
 Kristen Wilson from [facility] is requesting a callback at [phone] ext [phone] regarding them needing office notes from clearance appt on 05/25/24 and the form signed and faxed back to them at [phone]. Please advise

## 2024-05-29 ENCOUNTER — Telehealth: Payer: Self-pay | Admitting: Cardiology

## 2024-05-29 DIAGNOSIS — Z79899 Other long term (current) drug therapy: Secondary | ICD-10-CM

## 2024-05-29 DIAGNOSIS — E876 Hypokalemia: Secondary | ICD-10-CM

## 2024-05-29 NOTE — Telephone Encounter (Signed)
 Patient returning call and says she is unable to refill her hydrochlorothiazide . She called her pharmacy and says they told her there was a hold on it.  Patient states I have been on this forever and no one told me to stop it. I took my last pill today.   Epic records seem to indicate there was a hold placed when she was discharged from ED in May 2025, patient had been admitted for hypokalemia. Her most recent labs from 05/10/24 show a K+ of 2.9. Forwarded to Dr. Michele for advice.

## 2024-05-29 NOTE — Telephone Encounter (Signed)
 Returned patient's call, no answer. Left voice mail asking patient to call us  back.

## 2024-05-29 NOTE — Telephone Encounter (Signed)
 Call to patient to advise Dr. Michele would like her repeat a BMET, orders placed.   Patient states she is out of her Klor-Con  on Monday May 25, 2024. She states she has an over the counter supplement in the house but has not been taking it.

## 2024-05-29 NOTE — Addendum Note (Signed)
 Addended by: JANIT GENI CROME on: 05/29/2024 06:24 PM   Modules accepted: Orders

## 2024-05-29 NOTE — Telephone Encounter (Signed)
 Pt c/o medication issue:  1. Name of Medication:   hydrochlorothiazide  (HYDRODIURIL ) 25 MG tablet (Paused)   2. How are you currently taking this medication (dosage and times per day)?   3. Are you having a reaction (difficulty breathing--STAT)?   4. What is your medication issue?   Patient wants a call back to discuss if she still needs to be taking this medication.

## 2024-05-29 NOTE — Telephone Encounter (Signed)
 Sure check BMP to make sure her K is fine now.  She is on potassium supplementation - please confirm.   Kristen Mahler [place], DO, FACC

## 2024-06-01 NOTE — Telephone Encounter (Signed)
 The labs are still pending.  If I understand correctly - the labs should reflect her off Klor-Con  and hydrochlorothiazide .   Macoy Rodwell [place], DO, FACC

## 2024-06-02 DIAGNOSIS — Z79899 Other long term (current) drug therapy: Secondary | ICD-10-CM | POA: Diagnosis not present

## 2024-06-02 DIAGNOSIS — E876 Hypokalemia: Secondary | ICD-10-CM | POA: Diagnosis not present

## 2024-06-02 NOTE — Telephone Encounter (Signed)
Attempted phone call to pt and left voicemail message to contact office at [phone]. 

## 2024-06-02 NOTE — Telephone Encounter (Signed)
 Patient returned RN's call.

## 2024-06-02 NOTE — Telephone Encounter (Signed)
 Spoke with pt who reports she just completed lab work.  She is not taking Klor-Con  or Hydrochlorothiazide .  Will await lab results.

## 2024-06-03 LAB — BASIC METABOLIC PANEL WITH GFR
BUN/Creatinine Ratio: 18 (ref 12–28)
BUN: 14 mg/dL (ref 8–27)
CO2: 30 mmol/L — ABNORMAL HIGH (ref 20–29)
Calcium: 8.9 mg/dL (ref 8.7–10.3)
Chloride: 100 mmol/L (ref 96–106)
Creatinine, Ser: 0.76 mg/dL (ref 0.57–1.00)
Glucose: 111 mg/dL — ABNORMAL HIGH (ref 70–99)
Potassium: 3.2 mmol/L — ABNORMAL LOW (ref 3.5–5.2)
Sodium: 146 mmol/L — ABNORMAL HIGH (ref 134–144)
eGFR: 82 mL/min/1.73 (ref >59)

## 2024-06-04 ENCOUNTER — Ambulatory Visit: Payer: Self-pay | Admitting: Cardiology

## 2024-06-04 MED ORDER — SPIRONOLACTONE 25 MG PO TABS: 25.0000 mg | ORAL_TABLET | Freq: Every day | ORAL | 3 refills | Status: AC

## 2024-06-04 NOTE — Telephone Encounter (Signed)
 Pt requesting a c/b to go over results.

## 2024-06-04 NOTE — Telephone Encounter (Signed)
 Spoke with Kristen Wilson and went over recommendations from Dr. Michele. Kristen Wilson wanted to make sure she is supposed to stop potassium since her potassium levels are still low. Explained to Kristen Wilson that Spironolactone is a potassium-sparing medication and we don't want to cause a rebound effect and have her potassium too high but because her last potassium level was still below normal, we would send this to Dr. Michele to clarify. Explained that we will be in touch as soon as we hear back from Dr. Michele. Kristen Wilson verbalized understanding of plan and had no further questions at this time. Spironolactone 25 mg and BMP have been ordered.

## 2024-06-04 NOTE — Telephone Encounter (Signed)
 She is currently on ARB and spironolactone which should help her potassium hemostasis. I understand her concern but the other possibility is unintentional hyperkalemia. Therefore would recommend start spironolactone as soon as possible with repeat BMP in 1 week. When her BMP is checked in 1 week if potassium is still not at goal we will restart supplementation.  Dr. Elson Ulbrich

## 2024-06-04 NOTE — Addendum Note (Signed)
 Addended by: GORDON RONAL SQUIBB on: 06/04/2024 11:41 AM   Modules accepted: Orders

## 2024-06-05 NOTE — Telephone Encounter (Signed)
 Patient returned call

## 2024-06-05 NOTE — Telephone Encounter (Signed)
 Kristen Richardson, DO to [place], RN ST   06/04/24  1:22 PM Note She is currently on ARB and spironolactone which should help her potassium hemostasis. I understand her concern but the other possibility is unintentional hyperkalemia. Therefore would recommend start spironolactone as soon as possible with repeat BMP in 1 week. When her BMP is checked in 1 week if potassium is still not at goal we will restart supplementation.   Dr. Michele     S/w the patient and gave the information above. She will [facility] a [facility] to get BMP. Also gave her the number to MyChart help to help her with access to her MyChart.  She verbalized understanding of all information.

## 2024-06-05 NOTE — Telephone Encounter (Signed)
 Left message to call back.

## 2024-06-08 NOTE — Telephone Encounter (Signed)
 Office is calling for update because patient needs to get schedule. Please advise

## 2024-06-08 NOTE — Telephone Encounter (Signed)
 Left message for [facility] surgery scheduler for Dr. Reyne.I left message that we s/w 05/28/24 which at time I reviewed the notes from Dr. Michele as to the urgency and recommendations.   When [place] and I s/w 05/28/24 I faxed the notes her to d/w Dr. Reyne. We were waiting to hear back about if Dr. Reyne felt of urgent matter (see notes from DR. Tolia ).   I will fax these notes to Dr. Reyne and Delon again. Please reply to our office to DR. Tolia as to urgency.

## 2024-06-09 NOTE — Telephone Encounter (Signed)
 Can we move up her echo and MPI - its not until November.   Kerriann Kamphuis [place], DO, FACC

## 2024-06-09 NOTE — Telephone Encounter (Signed)
 I called back and left message for [employer].

## 2024-06-09 NOTE — Telephone Encounter (Signed)
 Delon called back, she states she d/w Dr. Reyne as to the notes from Dr. Michele.   Per Delon, Dr. Reyne wanted to let Dr. Michele know the procedure is a Kyphoplasty, MAC anesthesia, no blood loss, not urgent HOWEVER the pt does have a fracture and is in a lot of pain. DR. Reyne would like to know if Dr. Michele would be agreeable with this information for the procedure for the pt.   I did state that Dr. Michele concerned with: Given her chest pain and abnormal EKG recommend ischemic workup as outlined above.  However, if her surgery is deemed to be emergent with regards to life saving or limb saving can proceed forward with surgery.  But if it is considered elective would recommend ischemic workup prior to surgical intervention Patient verbalizes understanding and states that is nonemergent surgery   I did assured Delon that I will forward this to Dr. Michele and the preop APP to review the notes from today. Delon asked for a call back once reviewed with MD and preop APP.

## 2024-06-09 NOTE — Telephone Encounter (Signed)
 Delon with [facility] calling to speak with preop team

## 2024-06-10 ENCOUNTER — Other Ambulatory Visit: Payer: Self-pay | Admitting: Cardiology

## 2024-06-10 ENCOUNTER — Ambulatory Visit (HOSPITAL_COMMUNITY)
Admission: RE | Admit: 2024-06-10 | Discharge: 2024-06-10 | Disposition: A | Discharge status: Home / Self Care | Source: Ambulatory Visit | Attending: Cardiology | Admitting: Cardiology

## 2024-06-10 DIAGNOSIS — I358 Other nonrheumatic aortic valve disorders: Secondary | ICD-10-CM | POA: Insufficient documentation

## 2024-06-10 DIAGNOSIS — I351 Nonrheumatic aortic (valve) insufficiency: Secondary | ICD-10-CM | POA: Insufficient documentation

## 2024-06-10 DIAGNOSIS — R9431 Abnormal electrocardiogram [ECG] [EKG]: Secondary | ICD-10-CM | POA: Insufficient documentation

## 2024-06-10 DIAGNOSIS — R079 Chest pain, unspecified: Secondary | ICD-10-CM

## 2024-06-10 DIAGNOSIS — I10 Essential (primary) hypertension: Secondary | ICD-10-CM | POA: Diagnosis not present

## 2024-06-10 DIAGNOSIS — I340 Nonrheumatic mitral (valve) insufficiency: Secondary | ICD-10-CM | POA: Diagnosis not present

## 2024-06-10 DIAGNOSIS — R072 Precordial pain: Secondary | ICD-10-CM

## 2024-06-10 DIAGNOSIS — Z01810 Encounter for preprocedural cardiovascular examination: Secondary | ICD-10-CM | POA: Insufficient documentation

## 2024-06-10 DIAGNOSIS — Z01818 Encounter for other preprocedural examination: Secondary | ICD-10-CM

## 2024-06-10 LAB — ECHOCARDIOGRAM COMPLETE
AR max vel: 2.44 cm2
AV Area VTI: 2.38 cm2
AV Area mean vel: 2.38 cm2
AV Mean grad: 4 mmHg
AV Peak grad: 7.7 mmHg
Ao pk vel: 1.39 m/s
Area-P 1/2: 4.83 cm2
Calc EF: 66.1 %
S' Lateral: 2.6 cm
Single Plane A2C EF: 64.3 %
Single Plane A4C EF: 68.4 %

## 2024-06-10 NOTE — Telephone Encounter (Signed)
 Per Olam ORNBETHA Harari there! I have an opening this afternoon at the hospital and can et her NUC this week. I left her a message to call me asap.

## 2024-06-10 NOTE — Telephone Encounter (Signed)
 I will send a message to our Administrative scheduler Olam ORN. To see if she can help move the stress test and echo up per Dr. Michele.

## 2024-06-10 NOTE — Telephone Encounter (Signed)
 Echo has been moved up to 06/10/24 and stress test moved up to 06/11/24. I will update the preop APP and Dr. Michele.

## 2024-06-11 ENCOUNTER — Ambulatory Visit (HOSPITAL_COMMUNITY)
Admission: RE | Admit: 2024-06-11 | Discharge: 2024-06-11 | Disposition: A | Discharge status: Home / Self Care | Source: Ambulatory Visit | Attending: Cardiology | Admitting: Cardiology

## 2024-06-11 DIAGNOSIS — R072 Precordial pain: Secondary | ICD-10-CM | POA: Insufficient documentation

## 2024-06-11 DIAGNOSIS — R079 Chest pain, unspecified: Secondary | ICD-10-CM | POA: Insufficient documentation

## 2024-06-11 LAB — MYOCARDIAL PERFUSION IMAGING
LV dias vol: 74 mL (ref 46–106)
LV sys vol: 25 mL (ref 3.8–5.2)
Nuc Stress EF: 66 %
Peak HR: 78 {beats}/min
Rest HR: 58 {beats}/min
Rest Nuclear Isotope Dose: 10.7 mCi
SDS: 0
SRS: 0
SSS: 0
ST Depression (mm): 0 mm
Stress Nuclear Isotope Dose: 31.4 mCi
TID: 1.25

## 2024-06-11 MED ORDER — TECHNETIUM TC 99M TETROFOSMIN IV KIT
10.7000 | PACK | Freq: Once | INTRAVENOUS | Status: AC | PRN
  Administered 2024-06-11: 10.7 via INTRAVENOUS

## 2024-06-11 MED ORDER — REGADENOSON 0.4 MG/5ML IV SOLN
INTRAVENOUS | Status: AC
  Filled 2024-06-11: qty 5

## 2024-06-11 MED ORDER — REGADENOSON 0.4 MG/5ML IV SOLN
0.4000 mg | Freq: Once | INTRAVENOUS | Status: AC
  Administered 2024-06-11: 0.4 mg via INTRAVENOUS

## 2024-06-11 MED ORDER — TECHNETIUM TC 99M TETROFOSMIN IV KIT
31.0000 | PACK | Freq: Once | INTRAVENOUS | Status: AC | PRN
  Administered 2024-06-11: 31.4 via INTRAVENOUS

## 2024-06-15 ENCOUNTER — Telehealth: Payer: Self-pay | Admitting: Cardiology

## 2024-06-15 DIAGNOSIS — E876 Hypokalemia: Secondary | ICD-10-CM | POA: Diagnosis not present

## 2024-06-15 DIAGNOSIS — Z79899 Other long term (current) drug therapy: Secondary | ICD-10-CM | POA: Diagnosis not present

## 2024-06-15 NOTE — Telephone Encounter (Signed)
 Spoke to patient, informed her that Dr Michele has not reviewed the echo or myoview . Once he review the information will be sent . Patient ask to call her because she does not know how to view her my chart.  She also ask if she was suppose to have more labs. RN informed patient she should go to the lab for BMP - one week after starting Spironolactone . She starts she started but she has not gone yet. She voiced understanding.

## 2024-06-15 NOTE — Telephone Encounter (Signed)
 Pt called stating she's very worried and confused so she'd like for the nurse to give her a callback to give clarity on what she should be doing from here and what medications she should be taking. Please advise

## 2024-06-16 LAB — BASIC METABOLIC PANEL WITH GFR
BUN/Creatinine Ratio: 15 (ref 12–28)
BUN: 17 mg/dL (ref 8–27)
CO2: 27 mmol/L (ref 20–29)
Calcium: 8.8 mg/dL (ref 8.7–10.3)
Chloride: 102 mmol/L (ref 96–106)
Creatinine, Ser: 1.12 mg/dL — ABNORMAL HIGH (ref 0.57–1.00)
Glucose: 91 mg/dL (ref 70–99)
Potassium: 4.2 mmol/L (ref 3.5–5.2)
Sodium: 143 mmol/L (ref 134–144)
eGFR: 52 mL/min/1.73 — ABNORMAL LOW (ref >59)

## 2024-06-20 ENCOUNTER — Encounter: Payer: Self-pay | Admitting: Cardiology

## 2024-06-20 ENCOUNTER — Ambulatory Visit: Payer: Self-pay | Admitting: Cardiology

## 2024-06-22 NOTE — Telephone Encounter (Signed)
 Will forward back to preop to review notes from Dr.Tolia and if any further notes are needed.

## 2024-07-03 ENCOUNTER — Ambulatory Visit (HOSPITAL_COMMUNITY)
